# Patient Record
Sex: Male | Born: 1945 | Race: White | Hispanic: No | Marital: Married | Smoking: Former smoker
Health system: Southern US, Community
[De-identification: ages and names within clinical notes are randomized; demographics above are authoritative.]

## PROBLEM LIST (undated history)

## (undated) DIAGNOSIS — E785 Hyperlipidemia, unspecified: Secondary | ICD-10-CM

## (undated) DIAGNOSIS — M1990 Unspecified osteoarthritis, unspecified site: Secondary | ICD-10-CM

## (undated) DIAGNOSIS — I10 Essential (primary) hypertension: Secondary | ICD-10-CM

## (undated) HISTORY — DX: Hyperlipidemia, unspecified: E78.5

## (undated) HISTORY — PX: TOOTH EXTRACTION: SUR596

## (undated) HISTORY — DX: Unspecified osteoarthritis, unspecified site: M19.90

## (undated) HISTORY — PX: TONSILLECTOMY: SHX5217

## (undated) HISTORY — PX: TOTAL KNEE ARTHROPLASTY: SHX125

## (undated) HISTORY — PX: VARICOSE VEIN SURGERY: SHX832

## (undated) HISTORY — DX: Essential (primary) hypertension: I10

---

## 2000-02-03 ENCOUNTER — Ambulatory Visit (HOSPITAL_COMMUNITY): Admission: RE | Admit: 2000-02-03 | Discharge: 2000-02-03 | Payer: Self-pay | Admitting: Gastroenterology

## 2002-12-17 ENCOUNTER — Ambulatory Visit (HOSPITAL_COMMUNITY): Admission: RE | Admit: 2002-12-17 | Discharge: 2002-12-17 | Payer: Self-pay | Admitting: Gastroenterology

## 2007-11-08 LAB — HM COLONOSCOPY: HM Colonoscopy: NORMAL

## 2008-01-03 ENCOUNTER — Encounter: Admission: RE | Admit: 2008-01-03 | Discharge: 2008-02-04 | Payer: Self-pay | Admitting: Orthopedic Surgery

## 2008-03-24 ENCOUNTER — Encounter: Admission: RE | Admit: 2008-03-24 | Discharge: 2008-04-24 | Payer: Self-pay | Admitting: Orthopedic Surgery

## 2008-05-07 ENCOUNTER — Ambulatory Visit (HOSPITAL_COMMUNITY): Admission: RE | Admit: 2008-05-07 | Discharge: 2008-05-07 | Payer: Self-pay | Admitting: Gastroenterology

## 2010-08-23 ENCOUNTER — Ambulatory Visit: Payer: Self-pay | Admitting: Family Medicine

## 2010-08-23 DIAGNOSIS — N529 Male erectile dysfunction, unspecified: Secondary | ICD-10-CM | POA: Insufficient documentation

## 2010-08-23 DIAGNOSIS — I10 Essential (primary) hypertension: Secondary | ICD-10-CM | POA: Insufficient documentation

## 2010-08-23 DIAGNOSIS — E785 Hyperlipidemia, unspecified: Secondary | ICD-10-CM | POA: Insufficient documentation

## 2010-08-23 LAB — CONVERTED CEMR LAB
Cholesterol, target level: 200 mg/dL
HDL goal, serum: 40 mg/dL

## 2010-10-19 ENCOUNTER — Encounter: Payer: Self-pay | Admitting: Family Medicine

## 2010-10-19 ENCOUNTER — Ambulatory Visit: Payer: Self-pay | Admitting: Family Medicine

## 2010-10-21 LAB — CONVERTED CEMR LAB
ALT: 31 units/L (ref 0–53)
AST: 24 units/L (ref 0–37)
Albumin: 4 g/dL (ref 3.5–5.2)
Alkaline Phosphatase: 44 units/L (ref 39–117)
BUN: 20 mg/dL (ref 6–23)
Bilirubin, Direct: 0.2 mg/dL (ref 0.0–0.3)
Calcium: 9 mg/dL (ref 8.4–10.5)
Cholesterol: 194 mg/dL (ref 0–200)
Creatinine, Ser: 1 mg/dL (ref 0.4–1.5)
GFR calc non Af Amer: 84.44 mL/min (ref >60.00)
LDL Cholesterol: 111 mg/dL — ABNORMAL HIGH (ref 0–99)
PSA: 0.89 ng/mL (ref 0.10–4.00)
Total Protein: 6.4 g/dL (ref 6.0–8.3)
Triglycerides: 166 mg/dL — ABNORMAL HIGH (ref 0.0–149.0)

## 2010-12-07 NOTE — Assessment & Plan Note (Signed)
Summary: new to est--med check//ccm   Vital Signs:  Patient profile:   65 year old male Height:      75.25 inches Weight:      254 pounds BMI:     31.65 Temp:     98.4 degrees F oral Pulse rate:   80 / minute Pulse rhythm:   regular Resp:     12 per minute BP sitting:   140 / 82  (left arm) Cuff size:   large  Vitals Entered By: Sid Falcon LPN (August 23, 2010 10:33 AM)  Nutrition Counseling: Patient's BMI is greater than 25 and therefore counseled on weight management options.  Serial Vital Signs/Assessments:  Time      Position  BP       Pulse  Resp  Temp     By                     130/88                         Evelena Peat MD  CC: Hypertension Management, Lipid Management   History of Present Illness: new patient to establish care  Past history significant for osteoarthritis with prior bilateral knee replacements, hypertension, and hyperlipidemia. Initiated on hydrochlorothiazide within the past year. Blood pressures generally range 130 to 140s systolic.  needs lipid panel repeat.  Hx erectile dysfunction and needs refills of Viagra.  Colonoscopy 2009. No history of Pneumovax. Needs flu vaccine.  Family history significant for mother with colon cancer history. Father with hypertension.  Patient is married. Nonsmoker. Occasional alcohol use. 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  He notes no problems with any antihypertensive medication side effects.        Positive major cardiovascular risk factors include male age 66 years old or older, hyperlipidemia, and hypertension.  Negative major cardiovascular risk factors include no history of diabetes, negative family history for ischemic heart disease, and  non-tobacco-user status.        Further assessment for target organ damage reveals no history of ASHD, stroke/TIA, or peripheral vascular disease.    Lipid Management History:      Positive NCEP/ATP III risk factors include male age 81 years old or older and hypertension.  Negative NCEP/ATP III risk factors include non-diabetic, no family history for ischemic heart disease, non-tobacco-user status, no ASHD (atherosclerotic heart disease), no prior stroke/TIA, no peripheral vascular disease, and no history of aortic aneurysm.      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    Physical Exam  General:  Well-developed,well-nourished,in no acute distress; alert,appropriate and cooperative throughout examination Mouth:  Oral mucosa and oropharynx without lesions or exudates.  Teeth in good repair. Neck:  No deformities, masses, or tenderness noted. Lungs:  Normal respiratory effort, chest expands symmetrically. Lungs are clear to auscultation, no crackles or wheezes. Heart:  normal rate, regular rhythm, and no gallop.   Extremities:  No clubbing, cyanosis, edema, or deformity noted with normal full range of motion of all joints.   Neurologic:  alert & oriented X3 and cranial nerves II-XII intact.   Cervical Nodes:  No lymphadenopathy noted Psych:  normally interactive, good eye contact, not anxious appearing, and not depressed appearing.     Impression & Recommendations:  Problem # 1:  HYPERTENSION (ICD-401.9)  work on weight loss. His updated medication list for this problem includes:    Hydrochlorothiazide 12.5 Mg Caps (Hydrochlorothiazide) ..... Once daily  Orders: Prescription Created Electronically [phone])  Problem # 2:  HYPERLIPIDEMIA (ICD-272.4)  follow up for wellness exam and labs then. His updated medication list for this problem includes:    Vytorin 10-20 Mg Tabs (Ezetimibe-simvastatin) ..... Once daily  Orders: Prescription Created Electronically [phone])  Problem # 3:  IMPOTENCE OF ORGANIC ORIGIN (ICD-607.84)  refill Viagra. His updated medication list for this problem includes:    Viagra 100 Mg Tabs (Sildenafil citrate) ..... One by mouth once daily as needed  Orders: Prescription Created Electronically [phone])  Problem # 4:  Preventive Health Care (ICD-V70.0) flu and  pneumovax given.  no prior hx of pneumovax per pt.  Complete Medication List: 1)  Hydrochlorothiazide 12.5 Mg Caps (Hydrochlorothiazide) .... Once daily 2)  Vytorin 10-20 Mg Tabs (Ezetimibe-simvastatin) .... Once daily 3)  Viagra 100 Mg Tabs (Sildenafil citrate) .... One by mouth once daily as needed  Other Orders: Pneumococcal Vaccine (29562) Admin 1st Vaccine (13086) Flu Vaccine 40yrs + MEDICARE PATIENTS (V7846) Administration Flu vaccine - MCR (N6295)  Hypertension Assessment/Plan:      The patient's hypertensive risk group is category B: At least one risk factor (excluding diabetes) with no target organ damage.  Today's blood pressure is 140/82.    Lipid Assessment/Plan:      Based on NCEP/ATP III, the patient's risk factor category is "2 or more risk factors and a calculated 10 year CAD risk of > 20%".  The patient's lipid goals are as follows: Total cholesterol goal is 200; LDL cholesterol goal is 130; HDL cholesterol goal is 40; Triglyceride goal is 150.    Patient Instructions: 1)  Schedule Medicare wellness visit 2)  It is important that you exercise reguarly at least 20 minutes 5 times a week. If you develop chest pain, have severe difficulty breathing, or feel very tired, stop exercising immediately and seek medical attention.  3)  You need to lose weight. Consider a lower calorie diet and regular exercise.  Prescriptions: VYTORIN 10-20 MG  TABS (EZETIMIBE-SIMVASTATIN) once daily  #90 x 3   Entered and Authorized by:   Evelena Peat MD   Signed by:   Evelena Peat MD on 08/23/2010   Method used:   Electronically to        [facility] [phone]* (retail)       [address]       [place], [place]  [address]       Ph: [phone] or [phone]       Fax: [phone]   RxID:   [id number] HYDROCHLOROTHIAZIDE 12.5 MG CAPS (HYDROCHLOROTHIAZIDE) once daily  #90 x 3   Entered and Authorized by:   Evelena Peat MD   Signed by:   Evelena Peat MD on 08/23/2010   Method  used:   Electronically to        [facility] [phone]* (retail)       [address]       [place], [place]  [address]       Ph: [phone] or [phone]       Fax: [phone]   RxID:   [id number] VIAGRA 100 MG TABS (SILDENAFIL CITRATE) one by mouth once daily as needed  #6 x 11   Entered and Authorized by:   Evelena Peat MD   Signed by:   Evelena Peat MD on 08/23/2010   Method used:   Electronically to        [facility] [phone]* (retail)       [address]       [place], [place]  [address]       Ph: [phone] or [phone]       Fax: [phone]   RxID:   [id number]    Orders Added: 1)  Pneumococcal Vaccine [90732] 2)  Admin 1st Vaccine [90471] 3)  Flu Vaccine 1yrs + MEDICARE PATIENTS [Q2039] 4)  Administration Flu vaccine - MCR [G0008] 5)  New Patient Level III [90240] 6)  Prescription Created Electronically [phone]   Immunizations Administered:  Pneumonia Vaccine:    Vaccine Type: Pneumovax    Site: right deltoid    Mfr: Merck    Dose: 0.5 ml    Route: IM    Given by: Sid Falcon LPN    Exp. Date: 02/29/2012    Lot #: [id number]    VIS given: 10/12/09 version given August 23, 2010.   Immunizations Administered:  Pneumonia Vaccine:    Vaccine Type: Pneumovax    Site: right deltoid    Mfr: Merck    Dose: 0.5 ml    Route: IM    Given by: Sid Falcon LPN    Exp. Date: 02/29/2012    Lot #: [id number]    VIS given: 10/12/09 version given August 23, 2010.    Flu Vaccine Consent Questions     Do you have a history of severe allergic reactions to this vaccine? no    Any prior history of allergic reactions to egg and/or gelatin? no    Do you have a sensitivity to the preservative Thimersol? no    Do you have a past history of Guillan-Barre Syndrome? no    Do you currently have an acute febrile illness? no    Have you ever had a severe reaction to latex? no    Vaccine information given and explained to patient? yes    Are you currently pregnant?  no    Lot Number:[id number]   Exp Date:05/07/2011   Site Given  Left Deltoid IMedflu  Preventive Care Screening  Colonoscopy:    Date:  11/08/2007    Results:  normal

## 2010-12-09 NOTE — Assessment & Plan Note (Signed)
Summary: pt will come in fasting/njr   Vital Signs:  Patient profile:   65 year old male Weight:      259 pounds Temp:     98.3 degrees F oral BP sitting:   130 / 88  (left arm) Cuff size:   large  Vitals Entered By: Sid Falcon LPN (October 19, 2010 9:10 AM)  History of Present Illness: Patient here for initial Medicare wellness exam. Past history hypertension, hyperlipidemia, and erectile dysfunction. Medications reviewed.  Last colonoscopy 2009. No history of shingles vaccine. Flu vaccine and Pneumovax given last visit. does not exercise consistently.  PMH, SH, and FH reviewed.  Clinical Review Panels:  Prevention   Last Colonoscopy:  normal (11/08/2007)  Immunizations   Last Flu Vaccine:  Fluvax 3+ (08/23/2010)   Last Pneumovax:  Pneumovax (08/23/2010)  Diabetes Management   Last Flu Vaccine:  Fluvax 3+ (08/23/2010)   Last Pneumovax:  Pneumovax (08/23/2010)   Allergies (verified): No Known Drug Allergies  Past History:  Past Medical History: Last updated: 08/23/2010 Arthritis Hyperlipidemia Hypertension  Past Surgical History: Last updated: 08/23/2010 Bil knee replaced March and June 2009 Varicose veins removed 1991 Tonsils removed as a child 1991 Orthoscopic surgeries on Rt knee 1991, 1995  Family History: Last updated: 08/23/2010 Family History of Arthritis, father Family History of Colon CA, mother Family History Hypertension, father  Social History: Last updated: 08/23/2010 Occupation:  Semi-retired Tax adviser, 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 Alcohol use-yes Regular exercise-yes  Risk Factors: Exercise: yes (08/23/2010)  Risk Factors: Smoking Status: quit (08/23/2010) PMH-FH-SH reviewed for relevance  Review of Systems  The patient denies anorexia, fever, weight loss, vision loss, decreased hearing, hoarseness, chest pain, syncope, dyspnea on exertion, peripheral edema, prolonged cough, headaches, hemoptysis,  abdominal pain, melena, hematochezia, severe indigestion/heartburn, hematuria, incontinence, genital sores, muscle weakness, suspicious skin lesions, transient blindness, difficulty walking, depression, unusual weight change, abnormal bleeding, enlarged lymph nodes, and testicular masses.    Physical Exam  General:  Well-developed,well-nourished,in no acute distress; alert,appropriate and cooperative throughout examination Head:  Normocephalic and atraumatic without obvious abnormalities. No apparent alopecia or balding. Eyes:  pupils equal, pupils round, and pupils reactive to light.  pupils equal, pupils round, and pupils reactive to light.   Ears:  External ear exam shows no significant lesions or deformities.  Otoscopic examination reveals clear canals, tympanic membranes are intact bilaterally without bulging, retraction, inflammation or discharge. Hearing is grossly normal bilaterally. Mouth:  Oral mucosa and oropharynx without lesions or exudates.  Teeth in good repair. Neck:  No deformities, masses, or tenderness noted. Chest Wall:  No deformities, masses, tenderness or gynecomastia noted. Lungs:  Normal respiratory effort, chest expands symmetrically. Lungs are clear to auscultation, no crackles or wheezes. Heart:  Normal rate and regular rhythm. S1 and S2 normal without gallop, murmur, click, rub or other extra sounds. Abdomen:  Bowel sounds positive,abdomen soft and non-tender without masses, organomegaly or hernias noted. Rectal:  No external abnormalities noted. Normal sphincter tone. No rectal masses or tenderness. Prostate:  Prostate gland firm and smooth, no enlargement, nodularity, tenderness, mass, asymmetry or induration. Msk:  No deformity or scoliosis noted of thoracic or lumbar spine.   Extremities:  scars from previous bilateral knee replacements Neurologic:  alert & oriented X3 and cranial nerves II-XII intact.  alert & oriented X3 and cranial nerves II-XII intact.   Skin:   no rashes and no suspicious lesions.  no rashes and no suspicious lesions.   Cervical Nodes:  No lymphadenopathy noted Psych:  normally interactive, good eye contact, not anxious appearing, and not depressed appearing.  normally interactive, good eye contact, not anxious appearing, and not depressed appearing.     Impression & Recommendations:  Problem # 1:  Preventive Health Care (ICD-V70.0) His insurance does not cover Shingles vaccine and he wishes to wait. Needs more consistent exercise.  Check appropriate labs.  Baseline EKG obtained.  Problem # 2:  HYPERLIPIDEMIA (ICD-272.4)  His updated medication list for this problem includes:    Vytorin 10-20 Mg Tabs (Ezetimibe-simvastatin) ..... Once daily  Orders: Venipuncture (04540) Specimen Handling (98119) TLB-Lipid Panel (80061-LIPID) TLB-Hepatic/Liver Function Pnl (80076-HEPATIC)  Problem # 3:  HYPERTENSION (ICD-401.9)  His updated medication list for this problem includes:    Hydrochlorothiazide 12.5 Mg Caps (Hydrochlorothiazide) ..... Once daily  Orders: Venipuncture (14782) Specimen Handling (95621) TLB-BMP (Basic Metabolic Panel-BMET) (80048-METABOL)  Problem # 4:  IMPOTENCE OF ORGANIC ORIGIN (ICD-607.84)  His updated medication list for this problem includes:    Viagra 100 Mg Tabs (Sildenafil citrate) ..... One by mouth once daily as needed  Complete Medication List: 1)  Hydrochlorothiazide 12.5 Mg Caps (Hydrochlorothiazide) .... Once daily 2)  Vytorin 10-20 Mg Tabs (Ezetimibe-simvastatin) .... Once daily 3)  Viagra 100 Mg Tabs (Sildenafil citrate) .... One by mouth once daily as needed  Other Orders: EKG w/ Interpretation (93000) TLB-PSA (Prostate Specific Antigen) (84153-PSA) Welcome to Medicare, Physical (H0865)   Orders Added: 1)  EKG w/ Interpretation [93000] 2)  Venipuncture [36415] 3)  Specimen Handling [99000] 4)  TLB-Lipid Panel [80061-LIPID] 5)  TLB-Hepatic/Liver Function Pnl [80076-HEPATIC] 6)   TLB-BMP (Basic Metabolic Panel-BMET) [80048-METABOL] 7)  TLB-PSA (Prostate Specific Antigen) [78469-GEX] 8)  Welcome to Medicare, Physical [G0402]

## 2011-03-22 NOTE — Op Note (Signed)
Paul Townsend, Paul Townsend                ACCOUNT NO.:  [id number]   MEDICAL RECORD NO.:  [id number]          PATIENT TYPE:  AMB   LOCATION:  ENDO                         FACILITY:  [facility]   PHYSICIAN:  Anselmo Rod, M.D.  DATE OF BIRTH:  18-Jan-1946   DATE OF PROCEDURE:  05/07/2008  DATE OF DISCHARGE:  05/07/2008                               OPERATIVE REPORT   PROCEDURE PERFORMED:  Screening colonoscopy.   ENDOSCOPIST:  Anselmo Rod, M.D.   INSTRUMENT USED:  Pentax videocolonoscope.   INDICATIONS FOR PROCEDURE:  A 65 year old white male with a family  history of colon cancer in his mother, who was diagnosed at the age of  38.  He is undergoing a screening colonoscopy to rule out colonic  polyps, masses, etc.   PREPROCEDURE PREPARATION:  Informed consent was procured from the  patient.  The patient was fasted for 8 hours prior to the procedure and  prepped with a bottle of magnesium citrate and a gallon NuLYTELY the  night prior to the procedure.  The risks and benefits of the procedure,  including a 10% miss rate of cancer and polyps were discussed with the  patient as well.   PREPROCEDURE PHYSICAL:  The patient had stable vital signs.  NECK:  Supple.  CHEST:  Clear to auscultation.  S1 and S2 regular.  ABDOMEN:  Soft with normal bowel sounds.   DESCRIPTION OF PROCEDURE:  The patient was placed in the left lateral  decubitus position, sedated with 125 mcg of Fentanyl and 10 mg of Versed  given intravenously in slow incremental doses. Once the patient was  adequately sedated and maintained on low-flow oxygen and continuous  cardiac monitoring, the Pentax videocolonoscope was advanced from the  rectum to the cecum.  There was evidence of sigmoid diverticulosis, but  the appendiceal orifice and cecal valve were clearly visualized and  photographed.  No masses or polyps were seen.  Small internal  hemorrhoids were appreciated on retroflexion in the rectum.  The  terminal ileum  appeared healthy and without lesions.  The patient  tolerated the procedure well without immediate complications.   IMPRESSION:  1. Small nonbleeding internal hemorrhoids.  2. Sigmoid diverticulosis.  3. Normal-appearing transverse colon, right colon, cecum and terminal      ileum.  4. No masses or polyps seen.   RECOMMENDATIONS:  1. Continue a high fiber diet with liberal fluid intake.  2. Repeat colonoscopy in the next 5 years.  If the patient has any      abnormal GI symptoms in the interim, he is to contact the office      immediately for further recommendations.  3. Outpatient follow-up as need arises in the future.      Anselmo Rod, M.D.  Electronically Signed     JNM/MEDQ  D:  05/16/2008  T:  05/16/2008  Job:  [id number]   cc:   Evelena Peat, M.D.

## 2011-03-25 NOTE — Procedures (Signed)
[facility]  Patient:    Paul Townsend, Paul Townsend                         MRN: [id number] Proc. Date: 02/03/00 Adm. Date:  [id number] Attending:  Rich Brave CC:         Nolon Nations, M.D., M.P.H.                           Procedure Report  PROCEDURE PERFORMED:  Colonoscopy.  INDICATION FOR PROCEDURE:  A 65 year old for colon cancer screening.  FINDINGS:  Mild to moderate sigmoid diverticulosis.  Mild melanosis coli.  CONSENT:  The nature, purpose and risks of the procedure have been discussed with the patient who provided written consent.  SEDATION:  Fentanyl 100 mcg and Versed 10 mg IV without arrhythmias or desaturation.  DESCRIPTION OF PROCEDURE:  Digital exam of the prostate was normal.  The Olympus adult video colonoscope was advanced easily around the colon, reaching the cecum which was identified by clear visualization of the ileocecal valve in the absence of further lumen.  The patient was placed in the supine position to facility passage in the proximal colon.  Pull back was then performed.  The quality of the prep was excellent so it was felt that all areas were well seen.  There was mild melanosis coli, most prominent in the proximal colon. There was ild to moderate sigmoid diverticular change with associated muscular thickening (mycosis).  No polyps, cancer plaques or vascular malformations were seen and retroflexion f the rectum was unremarkable.  The patient tolerated the procedure well and there were no apparent complications.  No biopsies were obtained.  IMPRESSION: 1. Family history of colon cancer, but without any evidence of polyps on    the current examination. 2. Mild sigmoid diverticulosis. 3. Mild melanosis coli.  PLAN:  Consider flexible sigmoidoscopy in five years for continued screening, with another colonoscopy in about ten years. DD:  02/03/00 TD:  02/04/00 Job: [id number]

## 2011-08-03 ENCOUNTER — Other Ambulatory Visit: Payer: Self-pay | Admitting: Family Medicine

## 2011-08-22 ENCOUNTER — Other Ambulatory Visit: Payer: Self-pay | Admitting: Family Medicine

## 2011-10-23 ENCOUNTER — Other Ambulatory Visit: Payer: Self-pay | Admitting: Family Medicine

## 2011-11-04 ENCOUNTER — Ambulatory Visit (INDEPENDENT_AMBULATORY_CARE_PROVIDER_SITE_OTHER): Payer: Medicare Other | Admitting: Family Medicine

## 2011-11-04 ENCOUNTER — Encounter: Payer: Self-pay | Admitting: Family Medicine

## 2011-11-04 DIAGNOSIS — K5900 Constipation, unspecified: Secondary | ICD-10-CM | POA: Insufficient documentation

## 2011-11-04 NOTE — Progress Notes (Signed)
  Subjective:    Patient ID: Paul Townsend, male    DOB: 06/14/1946, 65 y.o.   MRN: [id number]  HPI Paul Townsend is a 65 year old, married man nonsmoker, who comes in today because he has not had a bowel movement since Monday.  He normally has two to 3 loose bowel movements daily.  Note cause for constipation.  No fever no chills.  No nausea, vomiting, or diarrhea.  Colonoscopy 2009 normal.   Review of Systems    General and GI review of systems otherwise negative Objective:   Physical Exam  Well-developed well-nourished, male in no acute distress.  Examination the abdomen shows it to be somewhat obese, but not distended.  Bowel sounds are normal.  Diffusely tender.  No rebound no masses.  Rectal normal.  No impaction guaiac-negative      Assessment & Plan:  Constipation plan increase fluid intake, fleets enema at return p.r.n.

## 2011-11-04 NOTE — Patient Instructions (Signed)
Drink lots of water.  Hold the Vytorin.  Use a fleets enema tonight to relieve y  constipation.  Return p.r.n.

## 2011-11-05 ENCOUNTER — Encounter (HOSPITAL_COMMUNITY): Payer: Self-pay | Admitting: Emergency Medicine

## 2011-11-05 ENCOUNTER — Emergency Department (HOSPITAL_COMMUNITY)
Admission: EM | Admit: 2011-11-05 | Discharge: 2011-11-05 | Disposition: A | Discharge status: Home / Self Care | Payer: Medicare Other | Attending: Emergency Medicine | Admitting: Emergency Medicine

## 2011-11-05 DIAGNOSIS — I10 Essential (primary) hypertension: Secondary | ICD-10-CM | POA: Insufficient documentation

## 2011-11-05 DIAGNOSIS — K5900 Constipation, unspecified: Secondary | ICD-10-CM | POA: Insufficient documentation

## 2011-11-05 DIAGNOSIS — E785 Hyperlipidemia, unspecified: Secondary | ICD-10-CM | POA: Insufficient documentation

## 2011-11-05 MED ORDER — POLYETHYLENE GLYCOL 3350 17 G PO PACK: 17.0000 g | PACK | Freq: Every day | ORAL | Status: AC

## 2011-11-05 MED ORDER — PEG 3350-KCL-NABCB-NACL-NASULF 236 G PO SOLR: 4.0000 L | Freq: Once | ORAL | Status: AC

## 2011-11-05 NOTE — ED Notes (Signed)
Pt reports constipation since Monday. Pt seen by Dr. Tawanna Cooler 11/04/2011 and told take enemas at home. Pt took 4 enemas without relief.

## 2011-11-05 NOTE — ED Provider Notes (Signed)
History    65yM with constipation. Has not had bowel movement since Monday. No significant pain. Feels bloat. No n/v. No hx of abdominal surgery. No hx of obstruction. Has been using saline enemas at home but only passing small amounts of stool hard round stool. No urinary complaints. No back pain. Says last colonoscopy in '09 and "they didn't even find a polyp."  CSN: [id number]  Arrival date & time 11/05/11  1139   First MD Initiated Contact with Patient 11/05/11 1236      Chief Complaint  Patient presents with  . Constipation    Since Monday    (Consider location/radiation/quality/duration/timing/severity/associated sxs/prior treatment) HPI  Past Medical History  Diagnosis Date  . Arthritis   . Hyperlipidemia   . Hypertension     Past Surgical History  Procedure Date  . Total knee arthroplasty   . Varicose vein surgery   . Tonsillectomy     Family History  Problem Relation Age of Onset  . Cancer Mother     colon  . Arthritis Father   . Hypertension Father     History  Substance Use Topics  . Smoking status: Former Games developer  . Smokeless tobacco: Not on file  . Alcohol Use: Yes      Review of Systems   Review of symptoms negative unless otherwise noted in HPI.   Allergies  Review of patient's allergies indicates no known allergies.  Home Medications   Current Outpatient Rx  Name Route Sig Dispense Refill  . BETA CAROTENE PO Oral Take 1 tablet by mouth daily.      Marland Kitchen VITAMIN D 1000 UNITS PO TABS Oral Take 1,000 Units by mouth daily.      Marland Kitchen EZETIMIBE-SIMVASTATIN 10-20 MG PO TABS Oral Take 1 tablet by mouth at bedtime.      . OMEGA-3 FATTY ACIDS 1000 MG PO CAPS Oral Take 1 g by mouth daily.      Marland Kitchen GARLIC PO Oral Take 1 capsule by mouth daily.      Marland Kitchen HYDROCHLOROTHIAZIDE 12.5 MG PO CAPS Oral Take 12.5 mg by mouth daily.      Marland Kitchen DHEA PO Oral Take 1 tablet by mouth daily.      Marland Kitchen SILDENAFIL CITRATE 100 MG PO TABS Oral Take 100 mg by mouth daily as needed.  For erectile dysfunction     . VITAMIN C 500 MG PO TABS Oral Take 500 mg by mouth daily.      Marland Kitchen PEG 3350-KCL-NABCB-NACL-NASULF 236 G PO SOLR Oral Take 4,000 mLs by mouth once. 4000 mL 0  . POLYETHYLENE GLYCOL 3350 PO PACK Oral Take 17 g by mouth daily. 14 each 0    BP 163/92  Pulse 86  Temp(Src) 98.8 F (37.1 C) (Oral)  Resp 18  SpO2 95%  Physical Exam  Nursing note and vitals reviewed. Constitutional: He appears well-developed and well-nourished. No distress.  HENT:  Head: Normocephalic and atraumatic.  Eyes: Conjunctivae are normal. Right eye exhibits no discharge. Left eye exhibits no discharge.  Neck: Neck supple.  Cardiovascular: Normal rate, regular rhythm and normal heart sounds.  Exam reveals no gallop and no friction rub.   No murmur heard. Pulmonary/Chest: Effort normal and breath sounds normal. No respiratory distress.  Abdominal: Soft. He exhibits no distension and no mass. There is no tenderness. There is no guarding.  Musculoskeletal: He exhibits no edema and no tenderness.  Neurological: He is alert.  Skin: Skin is warm and dry.  Psychiatric: He has a  normal mood and affect. His behavior is normal. Thought content normal.    ED Course  Procedures (including critical care time)  Labs Reviewed - No data to display No results found.   1. Constipation       MDM  65yM with constipation. Pt requesting additional medication aside from enemas. Suggested miralax. When explained that same medication as bowel prep prior to colonoscopy asked why couldn't just take that because just wants to go. Explained that typically use those quantities as bowel prep for people that already having normal BMs. Prescription for miralax given and recommended trying for a couple days first. If can't go feel that larger quantities reasonable. Clinically pt not with mechanical obstruction. Strict return precautions given.        Raeford Razor, MD 11/05/11 1310

## 2011-11-27 ENCOUNTER — Other Ambulatory Visit: Payer: Self-pay | Admitting: Family Medicine

## 2011-12-01 DIAGNOSIS — H35379 Puckering of macula, unspecified eye: Secondary | ICD-10-CM | POA: Diagnosis not present

## 2011-12-11 ENCOUNTER — Other Ambulatory Visit: Payer: Self-pay | Admitting: Family Medicine

## 2011-12-15 ENCOUNTER — Encounter: Payer: Self-pay | Admitting: Family Medicine

## 2011-12-15 ENCOUNTER — Ambulatory Visit (INDEPENDENT_AMBULATORY_CARE_PROVIDER_SITE_OTHER): Payer: Medicare Other | Admitting: Family Medicine

## 2011-12-15 DIAGNOSIS — N529 Male erectile dysfunction, unspecified: Secondary | ICD-10-CM

## 2011-12-15 DIAGNOSIS — E785 Hyperlipidemia, unspecified: Secondary | ICD-10-CM

## 2011-12-15 DIAGNOSIS — R5381 Other malaise: Secondary | ICD-10-CM

## 2011-12-15 DIAGNOSIS — N429 Disorder of prostate, unspecified: Secondary | ICD-10-CM

## 2011-12-15 DIAGNOSIS — R5383 Other fatigue: Secondary | ICD-10-CM

## 2011-12-15 DIAGNOSIS — N400 Enlarged prostate without lower urinary tract symptoms: Secondary | ICD-10-CM

## 2011-12-15 DIAGNOSIS — I10 Essential (primary) hypertension: Secondary | ICD-10-CM | POA: Diagnosis not present

## 2011-12-15 DIAGNOSIS — Z0000 Encounter for general adult medical examination without abnormal findings: Secondary | ICD-10-CM

## 2011-12-15 DIAGNOSIS — N320 Bladder-neck obstruction: Secondary | ICD-10-CM

## 2011-12-15 LAB — HEPATIC FUNCTION PANEL
ALT: 22 U/L (ref 0–53)
Alkaline Phosphatase: 48 U/L (ref 39–117)
Bilirubin, Direct: 0.2 mg/dL (ref 0.0–0.3)
Total Bilirubin: 1.7 mg/dL — ABNORMAL HIGH (ref 0.3–1.2)
Total Protein: 6.4 g/dL (ref 6.0–8.3)

## 2011-12-15 LAB — CBC WITH DIFFERENTIAL/PLATELET
Basophils Relative: 0.9 % (ref 0.0–3.0)
Hemoglobin: 15.1 g/dL (ref 13.0–17.0)
Lymphocytes Relative: 29 % (ref 12.0–46.0)
Monocytes Relative: 8.7 % (ref 3.0–12.0)
Neutro Abs: 3.7 10*3/uL (ref 1.4–7.7)
Neutrophils Relative %: 59.7 % (ref 43.0–77.0)
RBC: 4.69 Mil/uL (ref 4.22–5.81)
WBC: 6.3 10*3/uL (ref 4.5–10.5)

## 2011-12-15 LAB — BASIC METABOLIC PANEL
BUN: 12 mg/dL (ref 6–23)
CO2: 30 mEq/L (ref 19–32)
Chloride: 104 mEq/L (ref 96–112)
Creatinine, Ser: 0.9 mg/dL (ref 0.4–1.5)
Glucose, Bld: 102 mg/dL — ABNORMAL HIGH (ref 70–99)

## 2011-12-15 LAB — LIPID PANEL
LDL Cholesterol: 101 mg/dL — ABNORMAL HIGH (ref 0–99)
Total CHOL/HDL Ratio: 3
VLDL: 22 mg/dL (ref 0.0–40.0)

## 2011-12-15 MED ORDER — EZETIMIBE-SIMVASTATIN 10-20 MG PO TABS: 1.0000 | ORAL_TABLET | Freq: Every day | ORAL | Status: DC

## 2011-12-15 MED ORDER — HYDROCHLOROTHIAZIDE 12.5 MG PO CAPS: 12.5000 mg | ORAL_CAPSULE | Freq: Every day | ORAL | Status: DC

## 2011-12-15 NOTE — Progress Notes (Signed)
Subjective:    Patient ID: Paul Townsend, male    DOB: Oct 10, 1946, 66 y.o.   MRN: [id number]  HPI  Patient here for Medicare wellness exam and medical followup. He has history of hypertension, hyperlipidemia, and erectile dysfunction. Medications reviewed. Compliant with all. Blood pressure stable. Takes Vytorin for hyperlipidemia. No myalgias. History of osteoarthritis with previous bilateral knee replacements. Exercises with cycling.  Recently has had some problems with increased fatigue. Generally sleeping well. Appetite fair. No significant weight changes.  1.  Risk factors based on Past Medical , Social, and Family history  reviewed and as indicated below 2.  Limitations in physical activities no recent falls. States quite active 3.  Depression/mood no depression or mood disorder 4.  Hearing no recent changes in hearing. 5.  ADLs fully independent 6.  Cognitive function (orientation to time and place, language, writing, speech,memory) no memory deficits. 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  Home Safety No issues identified. 8.  Height, weight, and visual acuity. No recent changes 9.  Counseling weight control through diet and exercise. 10. Recommendation of preventive services. Flu vaccine declined. Check on coverage for shingles vaccine. Pneumovax up-to-date. Colonoscopy up to date. 11. Labs based on risk factors lipid panel, hepatic panel, basic metabolic panel, TSH, and CBC. PSA. 12. Care Plan as above    Review of Systems  Constitutional: Negative for fever, activity change, appetite change and fatigue.  HENT: Negative for ear pain, congestion and trouble swallowing.   Eyes: Negative for pain and visual disturbance.  Respiratory: Negative for cough, shortness of breath and wheezing.   Cardiovascular: Negative for chest pain and palpitations.  Gastrointestinal: Negative for nausea, vomiting, abdominal pain, diarrhea, constipation,  blood in stool, abdominal distention and rectal pain.  Genitourinary: Negative for dysuria, hematuria and testicular pain.  Musculoskeletal: Negative for joint swelling and arthralgias.  Skin: Negative for rash.  Neurological: Negative for dizziness, syncope and headaches.  Hematological: Negative for adenopathy.  Psychiatric/Behavioral: Negative for confusion and dysphoric mood.       Objective:   Physical Exam  Constitutional: He is oriented to person, place, and time. He appears well-developed and well-nourished. No distress.  HENT:  Head: Normocephalic and atraumatic.  Right Ear: External ear normal.  Left Ear: External ear normal.  Mouth/Throat: Oropharynx is clear and moist.  Eyes: Conjunctivae and EOM are normal. Pupils are equal, round, and reactive to light.  Neck: Normal range of motion. Neck supple. No thyromegaly present.  Cardiovascular: Normal rate, regular rhythm and normal heart sounds.   No murmur heard. Pulmonary/Chest: No respiratory distress. He has no wheezes. He has no rales.  Abdominal: Soft. Bowel sounds are normal. He exhibits no distension and no mass. There is no tenderness. There is no rebound and no guarding.  Genitourinary: Rectum normal and prostate normal.  Musculoskeletal: He exhibits no edema.  Lymphadenopathy:    He has no cervical adenopathy.  Neurological: He is alert and oriented to person, place, and time. He displays normal reflexes. No cranial nerve deficit.  Skin: No rash noted.  Psychiatric: He has a normal mood and affect.          Assessment & Plan:  #1 hyperlipidemia. Check lipid and hepatic panel #2 hypertension stable.  Continue regular aerobic exercise. Check basic metabolic panel  #3 fatigue. Add CBC and TSH to labs. Consider testosterone if continued symptoms and other labs normal. #4 health maintenance. Patient should consider tetanus booster but not covered for prevention solely  under Medicare. Check on coverage for  shingles vaccine. Pneumovax up-to-date. Colonoscopy up to date. Check PSA

## 2012-01-12 ENCOUNTER — Other Ambulatory Visit: Payer: Self-pay | Admitting: Family Medicine

## 2012-03-28 ENCOUNTER — Other Ambulatory Visit: Payer: Self-pay | Admitting: Family Medicine

## 2012-04-16 ENCOUNTER — Encounter: Payer: Self-pay | Admitting: Family Medicine

## 2012-04-16 ENCOUNTER — Ambulatory Visit (INDEPENDENT_AMBULATORY_CARE_PROVIDER_SITE_OTHER): Payer: Medicare Other | Admitting: Family Medicine

## 2012-04-16 VITALS — BP 130/80 | Temp 98.3°F | Wt 251.0 lb

## 2012-04-16 DIAGNOSIS — Z7189 Other specified counseling: Secondary | ICD-10-CM | POA: Diagnosis not present

## 2012-04-16 DIAGNOSIS — Z7184 Encounter for health counseling related to travel: Secondary | ICD-10-CM

## 2012-04-16 DIAGNOSIS — Z23 Encounter for immunization: Secondary | ICD-10-CM

## 2012-04-16 MED ORDER — ATOVAQUONE-PROGUANIL HCL 250-100 MG PO TABS: ORAL_TABLET | ORAL | Status: DC

## 2012-04-16 NOTE — Patient Instructions (Signed)
Consider typhoid prevention later this summer before next trip to [place]

## 2012-04-16 NOTE — Progress Notes (Signed)
  Subjective:    Patient ID: Paul Townsend, male    DOB: 07-24-1946, 66 y.o.   MRN: [id number]  HPI  Travel consult. Patient planning to travel to [place] by next week. No history of documented hepatitis A. Needs tetanus booster. Will need malaria prevention. At this point we do not have time to start typhoid vaccine since he will start oral malaria prevention.   Review of Systems  Constitutional: Negative for fever and chills.  Respiratory: Negative for cough.   Neurological: Negative for headaches.       Objective:   Physical Exam  Constitutional: He appears well-developed and well-nourished.  Neck: Neck supple. No thyromegaly present.  Cardiovascular: Normal rate and regular rhythm.   Pulmonary/Chest: Effort normal and breath sounds normal. No respiratory distress. He has no wheezes. He has no rales.  Musculoskeletal: He exhibits no edema.  Lymphadenopathy:    He has no cervical adenopathy.          Assessment & Plan:  Travel medicine consultation. Tetanus booster given. Hepatitis A given. Malaria prevention with Malarone. Ideally he needs typhoid vaccine but does not have time to develop full protection before travel. Cautions discussed

## 2012-06-19 DIAGNOSIS — H35379 Puckering of macula, unspecified eye: Secondary | ICD-10-CM | POA: Diagnosis not present

## 2012-06-19 DIAGNOSIS — H52209 Unspecified astigmatism, unspecified eye: Secondary | ICD-10-CM | POA: Diagnosis not present

## 2012-06-19 DIAGNOSIS — H25019 Cortical age-related cataract, unspecified eye: Secondary | ICD-10-CM | POA: Diagnosis not present

## 2012-06-26 ENCOUNTER — Telehealth: Payer: Self-pay | Admitting: Family Medicine

## 2012-06-26 DIAGNOSIS — H35379 Puckering of macula, unspecified eye: Secondary | ICD-10-CM | POA: Diagnosis not present

## 2012-06-26 DIAGNOSIS — H2510 Age-related nuclear cataract, unspecified eye: Secondary | ICD-10-CM | POA: Diagnosis not present

## 2012-06-26 DIAGNOSIS — H3581 Retinal edema: Secondary | ICD-10-CM | POA: Diagnosis not present

## 2012-06-26 NOTE — Telephone Encounter (Signed)
Patient leaving for [place] on Thursday Aug 22.  He needs a refill for atovaquone.  Pharmacy is [facility] [address].

## 2012-06-27 MED ORDER — ATOVAQUONE-PROGUANIL HCL 250-100 MG PO TABS: ORAL_TABLET | ORAL | Status: DC

## 2012-06-27 NOTE — Telephone Encounter (Signed)
Refill OK

## 2012-07-16 ENCOUNTER — Ambulatory Visit (INDEPENDENT_AMBULATORY_CARE_PROVIDER_SITE_OTHER): Payer: Medicare Other | Admitting: Family Medicine

## 2012-07-16 ENCOUNTER — Encounter: Payer: Self-pay | Admitting: Family Medicine

## 2012-07-16 VITALS — BP 142/82 | Temp 98.2°F | Wt 252.0 lb

## 2012-07-16 DIAGNOSIS — R233 Spontaneous ecchymoses: Secondary | ICD-10-CM | POA: Diagnosis not present

## 2012-07-16 LAB — CBC WITH DIFFERENTIAL/PLATELET
Basophils Relative: 0.4 % (ref 0.0–3.0)
Eosinophils Absolute: 0.2 10*3/uL (ref 0.0–0.7)
Eosinophils Relative: 2.4 % (ref 0.0–5.0)
HCT: 49.4 % (ref 39.0–52.0)
Lymphs Abs: 2.3 10*3/uL (ref 0.7–4.0)
MCHC: 33 g/dL (ref 30.0–36.0)
MCV: 94.8 fl (ref 78.0–100.0)
Monocytes Absolute: 1 10*3/uL (ref 0.1–1.0)
Neutrophils Relative %: 53.8 % (ref 43.0–77.0)
Platelets: 224 10*3/uL (ref 150.0–400.0)
RBC: 5.21 Mil/uL (ref 4.22–5.81)
WBC: 7.5 10*3/uL (ref 4.5–10.5)

## 2012-07-16 LAB — BASIC METABOLIC PANEL
BUN: 15 mg/dL (ref 6–23)
CO2: 29 mEq/L (ref 19–32)
Calcium: 9.2 mg/dL (ref 8.4–10.5)
Creatinine, Ser: 1.2 mg/dL (ref 0.4–1.5)
GFR: 61.76 mL/min (ref >60.00)
Glucose, Bld: 109 mg/dL — ABNORMAL HIGH (ref 70–99)
Sodium: 140 mEq/L (ref 135–145)

## 2012-07-16 LAB — HEPATIC FUNCTION PANEL
Albumin: 4.2 g/dL (ref 3.5–5.2)
Total Protein: 7.2 g/dL (ref 6.0–8.3)

## 2012-07-16 NOTE — Patient Instructions (Addendum)
Elevate legs frequently Follow up promptly for any fever or worsening rash

## 2012-07-16 NOTE — Progress Notes (Signed)
  Subjective:    Patient ID: Paul Townsend, male    DOB: 02-17-1946, 66 y.o.   MRN: [id number]  HPI  Rash lower legs. Appeared a few days ago. Recent flight. He had some mild edema. No other skin rash. He had some pain lower legs initially but less today. Rash is already fading somewhat. He denies any arthralgias. No fever. No chills. No recent tick bites. No headaches. No bruising  Past Medical History  Diagnosis Date  . Arthritis   . Hyperlipidemia   . Hypertension    Past Surgical History  Procedure Date  . Total knee arthroplasty   . Varicose vein surgery   . Tonsillectomy     reports that he has quit smoking. He does not have any smokeless tobacco history on file. He reports that he drinks alcohol. His drug history not on file. family history includes Arthritis in his father; Cancer in his mother; and Hypertension in his father. No Known Allergies    Review of Systems  Constitutional: Negative for fever, chills and unexpected weight change.  Respiratory: Negative for cough and shortness of breath.   Cardiovascular: Negative for chest pain.  Gastrointestinal: Negative for abdominal pain.  Genitourinary: Negative for hematuria.  Musculoskeletal: Negative for arthralgias.  Skin: Positive for rash.  Neurological: Negative for headaches.  Hematological: Negative for adenopathy. Does not bruise/bleed easily.       Objective:   Physical Exam  Constitutional: He appears well-developed and well-nourished.  Neck: Neck supple.  Cardiovascular: Normal rate and regular rhythm.   No murmur heard. Pulmonary/Chest: Effort normal and breath sounds normal. No respiratory distress. He has no wheezes. He has no rales.  Lymphadenopathy:    He has no cervical adenopathy.  Skin:       Patient has some nonblanching petechiae lower legs bilaterally. Trace edema bilaterally. No other skin rash. No ecchymosis          Assessment & Plan:  Petechiae lower legs. Probably secondary to  edema. No evidence for infectious and connective tissue origin less likely. Check sedimentation rate, CBC, basic chemistries. Elevate legs frequently. followup promptly for any new rash or new symptoms

## 2012-07-17 NOTE — Progress Notes (Signed)
Quick Note:  Pt wife informed ______ 

## 2012-09-12 ENCOUNTER — Encounter: Payer: Self-pay | Admitting: Family Medicine

## 2012-09-12 ENCOUNTER — Ambulatory Visit (INDEPENDENT_AMBULATORY_CARE_PROVIDER_SITE_OTHER): Payer: Medicare Other | Admitting: Family Medicine

## 2012-09-12 VITALS — BP 158/98 | Temp 97.8°F | Wt 260.0 lb

## 2012-09-12 DIAGNOSIS — M722 Plantar fascial fibromatosis: Secondary | ICD-10-CM | POA: Diagnosis not present

## 2012-09-12 NOTE — Progress Notes (Signed)
Subjective:     Patient ID: Paul Townsend, male   DOB: 21-Aug-1946, 66 y.o.   MRN: [id number]  HPI 66 year old male with approximately 1 month history of R foot pain, described by pt as achy.  The pain is located on the medial aspect of the R foot, posterior to the arch and anterior to the heel.  He describes it as worsening with activity, and is notably painful in the morning when he first gets out of bed.  Also is painful on standing up after pt has been resting from being on his feet a lot.  Pt has discontinued his normal routine of walking 4 miles several times per week because of the pain, and also at the recommendation of a chiropractor.  Ibuprofen has had little effect, although pt notes that ice has been the most helpful.  He denies any recent trauma or heavy activity around the time of the onset of pain.  Review of Systems  Musculoskeletal:       R medial foot pain.       Objective:   Physical Exam  Constitutional: He appears well-developed and well-nourished.  Musculoskeletal:       R medial foot pain anterior to heel, posterior to arch reproducible with deep palpation or dorsiflexion.  No erythema, swelling, or ulcerations noted.       Assessment:     66 year old male with 1 month history of R foot pain, also incidentally noted to have high BP today at 158/98.    Plan:     1. R foot pain: plantar fasciitis vs fracture. Location of pain somewhat anterior for classic plantar fasciitis, but given symptoms of worse pain first thing in the morning, and increase in pain when standing up after resting, this is likely plantar fasciitis.  Also lack of trauma history to R foot makes fracture less likely.  Emphasized to pt that stretching first thing in the morning via dorsiflexion, in addition to icing his R foot would be helpful.  Also told pt it would be ok to resume his walking regimen as tolerated, as abstaining from use would not significantly increase resolution of symptoms.  2.  Elevated BP: instructed pt to take his BP at home over the next 2 weeks using his wife's BP cuff, and to make f/u appt should he get several readings above 140/90.  Marthann Schiller, MS3    Pt has presentation consistent with plantar fasciitis.  No clinical suspicion of stress fracture.  No bony tenderness.  Discussed icing, stretches, heel cups, and continue walking as tolerated.  Evelena Peat, MD

## 2012-09-12 NOTE — Patient Instructions (Addendum)
Plantar Fasciitis Plantar fasciitis is a common condition that causes foot pain. It is soreness (inflammation) of the band of tough fibrous tissue on the bottom of the foot that runs from the heel bone (calcaneus) to the ball of the foot. The cause of this soreness may be from excessive standing, poor fitting shoes, running on hard surfaces, being overweight, having an abnormal walk, or overuse (this is common in runners) of the painful foot or feet. It is also common in aerobic exercise dancers and ballet dancers. SYMPTOMS  Most people with plantar fasciitis complain of:  Severe pain in the morning on the bottom of their foot especially when taking the first steps out of bed. This pain recedes after a few minutes of walking.  Severe pain is experienced also during walking following a long period of inactivity.  Pain is worse when walking barefoot or up stairs DIAGNOSIS   Your caregiver will diagnose this condition by examining and feeling your foot.  Special tests such as X-rays of your foot, are usually not needed. PREVENTION   Consult a sports medicine professional before beginning a new exercise program.  Walking programs offer a good workout. With walking there is a lower chance of overuse injuries common to runners. There is less impact and less jarring of the joints.  Begin all new exercise programs slowly. If problems or pain develop, decrease the amount of time or distance until you are at a comfortable level.  Wear good shoes and replace them regularly.  Stretch your foot and the heel cords at the back of the ankle (Achilles tendon) both before and after exercise.  Run or exercise on even surfaces that are not hard. For example, asphalt is better than pavement.  Do not run barefoot on hard surfaces.  If using a treadmill, vary the incline.  Do not continue to workout if you have foot or joint problems. Seek professional help if they do not improve. HOME CARE INSTRUCTIONS     Avoid activities that cause you pain until you recover.  Use ice or cold packs on the problem or painful areas after working out.  Only take over-the-counter or prescription medicines for pain, discomfort, or fever as directed by your caregiver.  Soft shoe inserts or athletic shoes with air or gel sole cushions may be helpful.  If problems continue or become more severe, consult a sports medicine caregiver or your own health care provider. Cortisone is a potent anti-inflammatory medication that may be injected into the painful area. You can discuss this treatment with your caregiver. MAKE SURE YOU:   Understand these instructions.  Will watch your condition.  Will get help right away if you are not doing well or get worse. Document Released: 07/19/2001 Document Revised: 01/16/2012 Document Reviewed: 09/17/2008 [facility] Patient Information 2013 [place], [place].    ------------------------------------------------------------------------------------------------------------------------- 1. Check blood pressure at home over next 2 weeks, and contact Dr. Caryl Never to schedule a follow-up appointment if you are getting several readings greater than 140/90.

## 2012-12-24 ENCOUNTER — Other Ambulatory Visit: Payer: Self-pay | Admitting: Family Medicine

## 2012-12-27 DIAGNOSIS — H25019 Cortical age-related cataract, unspecified eye: Secondary | ICD-10-CM | POA: Diagnosis not present

## 2012-12-27 DIAGNOSIS — H2510 Age-related nuclear cataract, unspecified eye: Secondary | ICD-10-CM | POA: Diagnosis not present

## 2012-12-27 DIAGNOSIS — H35379 Puckering of macula, unspecified eye: Secondary | ICD-10-CM | POA: Diagnosis not present

## 2013-01-19 ENCOUNTER — Other Ambulatory Visit: Payer: Self-pay | Admitting: Family Medicine

## 2013-01-28 DIAGNOSIS — H33309 Unspecified retinal break, unspecified eye: Secondary | ICD-10-CM | POA: Diagnosis not present

## 2013-01-28 DIAGNOSIS — H25019 Cortical age-related cataract, unspecified eye: Secondary | ICD-10-CM | POA: Diagnosis not present

## 2013-01-29 DIAGNOSIS — H33309 Unspecified retinal break, unspecified eye: Secondary | ICD-10-CM | POA: Diagnosis not present

## 2013-03-21 DIAGNOSIS — H2589 Other age-related cataract: Secondary | ICD-10-CM | POA: Diagnosis not present

## 2013-03-21 DIAGNOSIS — H25019 Cortical age-related cataract, unspecified eye: Secondary | ICD-10-CM | POA: Diagnosis not present

## 2013-04-11 DIAGNOSIS — H269 Unspecified cataract: Secondary | ICD-10-CM | POA: Diagnosis not present

## 2013-04-11 DIAGNOSIS — H25019 Cortical age-related cataract, unspecified eye: Secondary | ICD-10-CM | POA: Diagnosis not present

## 2013-04-11 DIAGNOSIS — H2589 Other age-related cataract: Secondary | ICD-10-CM | POA: Diagnosis not present

## 2013-04-23 ENCOUNTER — Other Ambulatory Visit: Payer: Self-pay | Admitting: Family Medicine

## 2013-05-13 DIAGNOSIS — H33309 Unspecified retinal break, unspecified eye: Secondary | ICD-10-CM | POA: Diagnosis not present

## 2013-05-28 DIAGNOSIS — K5730 Diverticulosis of large intestine without perforation or abscess without bleeding: Secondary | ICD-10-CM | POA: Diagnosis not present

## 2013-05-28 DIAGNOSIS — Z800 Family history of malignant neoplasm of digestive organs: Secondary | ICD-10-CM | POA: Diagnosis not present

## 2013-05-28 DIAGNOSIS — Z8371 Family history of colonic polyps: Secondary | ICD-10-CM | POA: Diagnosis not present

## 2013-05-28 DIAGNOSIS — K219 Gastro-esophageal reflux disease without esophagitis: Secondary | ICD-10-CM | POA: Diagnosis not present

## 2013-05-28 DIAGNOSIS — Z1211 Encounter for screening for malignant neoplasm of colon: Secondary | ICD-10-CM | POA: Diagnosis not present

## 2013-06-06 DIAGNOSIS — H35379 Puckering of macula, unspecified eye: Secondary | ICD-10-CM | POA: Diagnosis not present

## 2013-06-06 DIAGNOSIS — H33309 Unspecified retinal break, unspecified eye: Secondary | ICD-10-CM | POA: Diagnosis not present

## 2013-07-26 ENCOUNTER — Other Ambulatory Visit: Payer: Self-pay | Admitting: Family Medicine

## 2013-08-07 DIAGNOSIS — K5730 Diverticulosis of large intestine without perforation or abscess without bleeding: Secondary | ICD-10-CM | POA: Diagnosis not present

## 2013-08-07 DIAGNOSIS — Z1211 Encounter for screening for malignant neoplasm of colon: Secondary | ICD-10-CM | POA: Diagnosis not present

## 2013-08-07 DIAGNOSIS — Z800 Family history of malignant neoplasm of digestive organs: Secondary | ICD-10-CM | POA: Diagnosis not present

## 2013-08-07 DIAGNOSIS — Z8371 Family history of colonic polyps: Secondary | ICD-10-CM | POA: Diagnosis not present

## 2013-08-19 ENCOUNTER — Encounter: Payer: Self-pay | Admitting: Family Medicine

## 2013-08-19 ENCOUNTER — Other Ambulatory Visit: Payer: Self-pay

## 2013-08-19 ENCOUNTER — Ambulatory Visit (INDEPENDENT_AMBULATORY_CARE_PROVIDER_SITE_OTHER): Payer: Medicare Other | Admitting: Family Medicine

## 2013-08-19 VITALS — BP 142/80 | HR 78 | Temp 97.7°F | Wt 260.0 lb

## 2013-08-19 DIAGNOSIS — Z23 Encounter for immunization: Secondary | ICD-10-CM

## 2013-08-19 DIAGNOSIS — E669 Obesity, unspecified: Secondary | ICD-10-CM | POA: Insufficient documentation

## 2013-08-19 DIAGNOSIS — E785 Hyperlipidemia, unspecified: Secondary | ICD-10-CM

## 2013-08-19 DIAGNOSIS — N320 Bladder-neck obstruction: Secondary | ICD-10-CM

## 2013-08-19 DIAGNOSIS — Z0000 Encounter for general adult medical examination without abnormal findings: Secondary | ICD-10-CM | POA: Diagnosis not present

## 2013-08-19 LAB — LIPID PANEL
Cholesterol: 202 mg/dL — ABNORMAL HIGH (ref 0–200)
HDL: 57.5 mg/dL (ref >39.00)
Triglycerides: 230 mg/dL — ABNORMAL HIGH (ref 0.0–149.0)
VLDL: 46 mg/dL — ABNORMAL HIGH (ref 0.0–40.0)

## 2013-08-19 MED ORDER — SILDENAFIL CITRATE 100 MG PO TABS: 100.0000 mg | ORAL_TABLET | Freq: Every day | ORAL | Status: DC | PRN

## 2013-08-19 MED ORDER — EZETIMIBE-SIMVASTATIN 10-20 MG PO TABS: ORAL_TABLET | ORAL | Status: DC

## 2013-08-19 MED ORDER — HYDROCHLOROTHIAZIDE 12.5 MG PO CAPS: ORAL_CAPSULE | ORAL | Status: DC

## 2013-08-19 NOTE — Progress Notes (Signed)
Subjective:    Patient ID: Paul Townsend, male    DOB: 06/13/1946, 67 y.o.   MRN: [id number]  HPI Patient seen for Medicare wellness exam and medical followup. He has history of hypertension, hyperlipidemia, erectile dysfunction. Medications reviewed and compliant with all. Generally feels well. 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 Occasional slow stream. No major obstructive symptoms. Hyperlipidemia treated with Vytorin. No myalgias. No chest pains. Still needs flu vaccine.  No history of shingles vaccine. He does not think he had chickenpox but does not know for sure. He is walking about 4 miles per day for exercise.  Past Medical History  Diagnosis Date  . Arthritis   . Hyperlipidemia   . Hypertension    Past Surgical History  Procedure Laterality Date  . Total knee arthroplasty    . Varicose vein surgery    . Tonsillectomy      reports that he has quit smoking. He does not have any smokeless tobacco history on file. He reports that he drinks alcohol. His drug history is not on file. family history includes Arthritis in his father; Cancer in his mother; Heart disease (age of onset: 30) in his father; Hypertension in his father. No Known Allergies  1.  Risk factors based on Past Medical , Social, and Family history reviewed and as above, 2.  Limitations in physical activities no falls.  Walk for exercise as above. 3.  Depression/mood no issues. 4.  Hearing no major deficits. 5.  ADLs independent in all. 6.  Cognitive function (orientation to time and place, language, writing, speech,memory) no memory deficits.  Language and judgement intact. 7.  Home Safety no issues. 8.  Height, weight, and visual acuity. All stable. 9.  Counseling discussed exercise and weight control. 10. Recommendation of preventive services. Recommended flu vaccine.  Discussed shingles vaccine but he does not think he had chicken pox. 11. Labs based on risk factors lipid  and PSA. 12. Care Plan as above.      Review of Systems  Constitutional: Negative for fever, activity change, appetite change and fatigue.  HENT: Negative for congestion, ear pain and trouble swallowing.   Eyes: Negative for pain and visual disturbance.  Respiratory: Negative for cough, shortness of breath and wheezing.   Cardiovascular: Negative for chest pain and palpitations.  Gastrointestinal: Negative for nausea, vomiting, abdominal pain, diarrhea, constipation, blood in stool, abdominal distention and rectal pain.  Endocrine: Negative for polydipsia and polyuria.  Genitourinary: Negative for dysuria, hematuria and testicular pain.  Musculoskeletal: Negative for arthralgias and joint swelling.  Skin: Negative for rash.  Neurological: Negative for dizziness, syncope and headaches.  Hematological: Negative for adenopathy.  Psychiatric/Behavioral: Negative for confusion and dysphoric mood.       Objective:   Physical Exam  Constitutional: He is oriented to person, place, and time. He appears well-developed and well-nourished. No distress.  HENT:  Head: Normocephalic and atraumatic.  Right Ear: External ear normal.  Left Ear: External ear normal.  Mouth/Throat: Oropharynx is clear and moist.  Eyes: Conjunctivae and EOM are normal. Pupils are equal, round, and reactive to light.  Neck: Normal range of motion. Neck supple. No thyromegaly present.  Cardiovascular: Normal rate, regular rhythm and normal heart sounds.   No murmur heard. Pulmonary/Chest: No respiratory distress. He has no wheezes. He has no rales.  Abdominal: Soft. Bowel sounds are normal. He exhibits no distension and no mass. There is no tenderness. There is no rebound and no guarding.  Musculoskeletal:  He exhibits no edema.  Lymphadenopathy:    He has no cervical adenopathy.  Neurological: He is alert and oriented to person, place, and time. He displays normal reflexes. No cranial nerve deficit.  Skin: No rash  noted.  Patient has a few scattered seborrheic keratoses  Psychiatric: He has a normal mood and affect.          Assessment & Plan:  #1 health maintenance. Flu vaccine given. Continue regular exercise. Check Varicella IgG #2 Hyperlipidemia.  Check lipids.  Continue Vytorin. #3 hypertension.  Stable.

## 2013-08-20 ENCOUNTER — Telehealth: Payer: Self-pay

## 2013-08-20 LAB — VARICELLA ZOSTER ANTIBODY, IGG: Varicella IgG: 779.1 Index — ABNORMAL HIGH (ref <135.00)

## 2013-08-20 NOTE — Telephone Encounter (Signed)
Pt informed about lab results, he wants to know bout his chicken pox results

## 2013-10-10 DIAGNOSIS — H35379 Puckering of macula, unspecified eye: Secondary | ICD-10-CM | POA: Diagnosis not present

## 2013-10-10 DIAGNOSIS — Z961 Presence of intraocular lens: Secondary | ICD-10-CM | POA: Diagnosis not present

## 2013-10-10 DIAGNOSIS — H43819 Vitreous degeneration, unspecified eye: Secondary | ICD-10-CM | POA: Diagnosis not present

## 2014-01-05 ENCOUNTER — Emergency Department (HOSPITAL_COMMUNITY)
Admission: EM | Admit: 2014-01-05 | Discharge: 2014-01-05 | Disposition: A | Discharge status: Other Acute Inpatient Hospital | Payer: Medicare Other | Attending: Emergency Medicine | Admitting: Emergency Medicine

## 2014-01-05 ENCOUNTER — Emergency Department (HOSPITAL_COMMUNITY): Payer: Medicare Other

## 2014-01-05 ENCOUNTER — Encounter (HOSPITAL_COMMUNITY): Payer: Self-pay | Admitting: Emergency Medicine

## 2014-01-05 DIAGNOSIS — M129 Arthropathy, unspecified: Secondary | ICD-10-CM | POA: Insufficient documentation

## 2014-01-05 DIAGNOSIS — Z96649 Presence of unspecified artificial hip joint: Secondary | ICD-10-CM | POA: Diagnosis not present

## 2014-01-05 DIAGNOSIS — M978XXA Periprosthetic fracture around other internal prosthetic joint, initial encounter: Secondary | ICD-10-CM | POA: Insufficient documentation

## 2014-01-05 DIAGNOSIS — Z96659 Presence of unspecified artificial knee joint: Secondary | ICD-10-CM | POA: Insufficient documentation

## 2014-01-05 DIAGNOSIS — R6889 Other general symptoms and signs: Secondary | ICD-10-CM | POA: Diagnosis not present

## 2014-01-05 DIAGNOSIS — S72409A Unspecified fracture of lower end of unspecified femur, initial encounter for closed fracture: Secondary | ICD-10-CM | POA: Insufficient documentation

## 2014-01-05 DIAGNOSIS — I10 Essential (primary) hypertension: Secondary | ICD-10-CM | POA: Diagnosis not present

## 2014-01-05 DIAGNOSIS — Z01818 Encounter for other preprocedural examination: Secondary | ICD-10-CM | POA: Diagnosis not present

## 2014-01-05 DIAGNOSIS — D62 Acute posthemorrhagic anemia: Secondary | ICD-10-CM | POA: Diagnosis not present

## 2014-01-05 DIAGNOSIS — Z87891 Personal history of nicotine dependence: Secondary | ICD-10-CM | POA: Insufficient documentation

## 2014-01-05 DIAGNOSIS — Z01810 Encounter for preprocedural cardiovascular examination: Secondary | ICD-10-CM | POA: Diagnosis not present

## 2014-01-05 DIAGNOSIS — Z79899 Other long term (current) drug therapy: Secondary | ICD-10-CM | POA: Insufficient documentation

## 2014-01-05 DIAGNOSIS — M25469 Effusion, unspecified knee: Secondary | ICD-10-CM | POA: Diagnosis not present

## 2014-01-05 DIAGNOSIS — W108XXA Fall (on) (from) other stairs and steps, initial encounter: Secondary | ICD-10-CM | POA: Insufficient documentation

## 2014-01-05 DIAGNOSIS — S7291XA Unspecified fracture of right femur, initial encounter for closed fracture: Secondary | ICD-10-CM

## 2014-01-05 DIAGNOSIS — E785 Hyperlipidemia, unspecified: Secondary | ICD-10-CM | POA: Insufficient documentation

## 2014-01-05 DIAGNOSIS — S72443A Displaced fracture of lower epiphysis (separation) of unspecified femur, initial encounter for closed fracture: Secondary | ICD-10-CM | POA: Diagnosis not present

## 2014-01-05 DIAGNOSIS — S72453A Displaced supracondylar fracture without intracondylar extension of lower end of unspecified femur, initial encounter for closed fracture: Secondary | ICD-10-CM | POA: Diagnosis not present

## 2014-01-05 DIAGNOSIS — Y9289 Other specified places as the place of occurrence of the external cause: Secondary | ICD-10-CM | POA: Insufficient documentation

## 2014-01-05 DIAGNOSIS — T84049A Periprosthetic fracture around unspecified internal prosthetic joint, initial encounter: Secondary | ICD-10-CM | POA: Diagnosis not present

## 2014-01-05 DIAGNOSIS — G8918 Other acute postprocedural pain: Secondary | ICD-10-CM | POA: Diagnosis not present

## 2014-01-05 DIAGNOSIS — S72309A Unspecified fracture of shaft of unspecified femur, initial encounter for closed fracture: Secondary | ICD-10-CM | POA: Diagnosis not present

## 2014-01-05 DIAGNOSIS — M25569 Pain in unspecified knee: Secondary | ICD-10-CM | POA: Diagnosis not present

## 2014-01-05 DIAGNOSIS — Y9389 Activity, other specified: Secondary | ICD-10-CM | POA: Insufficient documentation

## 2014-01-05 HISTORY — PX: OTHER SURGICAL HISTORY: SHX169

## 2014-01-05 LAB — BASIC METABOLIC PANEL
BUN: 16 mg/dL (ref 6–23)
CHLORIDE: 102 meq/L (ref 96–112)
CO2: 28 meq/L (ref 19–32)
CREATININE: 0.92 mg/dL (ref 0.50–1.35)
Calcium: 9.1 mg/dL (ref 8.4–10.5)
GFR calc non Af Amer: 85 mL/min — ABNORMAL LOW (ref >90)
Glucose, Bld: 105 mg/dL — ABNORMAL HIGH (ref 70–99)
Potassium: 4.2 mEq/L (ref 3.7–5.3)
SODIUM: 140 meq/L (ref 137–147)

## 2014-01-05 LAB — CBC WITH DIFFERENTIAL/PLATELET
BASOS ABS: 0 10*3/uL (ref 0.0–0.1)
BASOS PCT: 1 % (ref 0–1)
EOS PCT: 3 % (ref 0–5)
Eosinophils Absolute: 0.3 10*3/uL (ref 0.0–0.7)
HCT: 46.3 % (ref 39.0–52.0)
Hemoglobin: 15.8 g/dL (ref 13.0–17.0)
LYMPHS PCT: 21 % (ref 12–46)
Lymphs Abs: 1.7 10*3/uL (ref 0.7–4.0)
MCH: 32 pg (ref 26.0–34.0)
MCHC: 34.1 g/dL (ref 30.0–36.0)
MCV: 93.9 fL (ref 78.0–100.0)
Monocytes Absolute: 0.8 10*3/uL (ref 0.1–1.0)
Monocytes Relative: 9 % (ref 3–12)
NEUTROS ABS: 5.3 10*3/uL (ref 1.7–7.7)
Neutrophils Relative %: 66 % (ref 43–77)
PLATELETS: 194 10*3/uL (ref 150–400)
RBC: 4.93 MIL/uL (ref 4.22–5.81)
RDW: 13.2 % (ref 11.5–15.5)
WBC: 8.1 10*3/uL (ref 4.0–10.5)

## 2014-01-05 LAB — PROTIME-INR
INR: 0.94 (ref 0.00–1.49)
PROTHROMBIN TIME: 12.4 s (ref 11.6–15.2)

## 2014-01-05 NOTE — ED Notes (Addendum)
Per EMS - pt slipped on ice and fell coming down his front steps.  Pt fell backwards, twisting right leg/knee backwards.  Pt denies LOC.  Pt had bilateral knee replacements in 2009 at [facility].  Pt c/o right knee pain/pressure.  Pt is not able to ambulate or bare weight.  Pt's vitals on scene 180/82 with hx of HTN.  HR 86 and regular, RR 14.

## 2014-01-05 NOTE — ED Notes (Signed)
Pt slipped and fell on brick steps coming out of his house this morning.  Pt fell backwards, bending his right knee behind him.  Pt c/o pain in right knee.  No edema is noted.  Pt is not able to ambulate on extremity.  Pt has pulses in RLE.

## 2014-01-05 NOTE — ED Notes (Signed)
Bed: KG81 Expected date: 01/05/14 Expected time:  Means of arrival:  Comments: EMS-Fall

## 2014-01-05 NOTE — ED Provider Notes (Signed)
CSN: [id number]     Arrival date & time 01/05/14  0920 History   First MD Initiated Contact with Patient 01/05/14 [phone]     Chief Complaint  Patient presents with  . Knee Pain    right - post fall     (Consider location/radiation/quality/duration/timing/severity/associated sxs/prior Treatment) HPI Comments: Patient presents to the ER for evaluation of right knee injury after a fall. Patient reports that he slipped on ice coming down his front steps, falling backwards. He reports that his right leg bent at the knee up under him and he was unable to bear weight on the leg afterwards. He reports moderate pain in the knee initially, but is now comfortable. No head injury or loss of consciousness. No upper extremity injury, no back pain, no chest pain, no abdominal pain.  Patient is a 68 y.o. male presenting with knee pain.  Knee Pain Associated symptoms: no back pain and no neck pain     Past Medical History  Diagnosis Date  . Arthritis   . Hyperlipidemia   . Hypertension    Past Surgical History  Procedure Laterality Date  . Total knee arthroplasty    . Varicose vein surgery    . Tonsillectomy     Family History  Problem Relation Age of Onset  . Cancer Mother     colon  . Arthritis Father   . Hypertension Father   . Heart disease Father 87    MI   History  Substance Use Topics  . Smoking status: Former Research scientist (life sciences)  . Smokeless tobacco: Never Used  . Alcohol Use: Yes    Review of Systems  Musculoskeletal: Positive for arthralgias. Negative for back pain and neck pain.  Neurological: Negative for syncope, numbness and headaches.  All other systems reviewed and are negative.      Allergies  Review of patient's allergies indicates no known allergies.  Home Medications   Current Outpatient Rx  Name  Route  Sig  Dispense  Refill  . BETA CAROTENE PO   Oral   Take 1 tablet by mouth daily.          Marland Kitchen ezetimibe-simvastatin (VYTORIN) 10-20 MG per tablet   Oral   Take  1 tablet by mouth at bedtime.         . fish oil-omega-3 fatty acids 1000 MG capsule   Oral   Take 1 g by mouth daily.           . hydrochlorothiazide (MICROZIDE) 12.5 MG capsule   Oral   Take 12.5 mg by mouth daily.         . sildenafil (VIAGRA) 100 MG tablet   Oral   Take 1 tablet (100 mg total) by mouth daily as needed. For erectile dysfunction   6 tablet   6   . vitamin B-12 (CYANOCOBALAMIN) 1000 MCG tablet   Oral   Take 1,000 mcg by mouth daily.         . vitamin C (ASCORBIC ACID) 500 MG tablet   Oral   Take 500 mg by mouth daily.            BP 159/66  Pulse 85  Temp(Src) 98.4 F (36.9 C) (Oral)  Resp 20  SpO2 98% Physical Exam  Constitutional: He is oriented to person, place, and time. He appears well-developed and well-nourished. No distress.  HENT:  Head: Normocephalic and atraumatic.  Right Ear: Hearing normal.  Left Ear: Hearing normal.  Nose: Nose normal.  Mouth/Throat: Oropharynx  is clear and moist and mucous membranes are normal.  Eyes: Conjunctivae and EOM are normal. Pupils are equal, round, and reactive to light.  Neck: Normal range of motion. Neck supple.  Cardiovascular: Regular rhythm, S1 normal and S2 normal.  Exam reveals no gallop and no friction rub.   No murmur heard. Pulmonary/Chest: Effort normal and breath sounds normal. No respiratory distress. He exhibits no tenderness.  Abdominal: Soft. Normal appearance and bowel sounds are normal. There is no hepatosplenomegaly. There is no tenderness. There is no rebound, no guarding, no tenderness at McBurney's point and negative Murphy's sign. No hernia.  Musculoskeletal: Normal range of motion.       Right knee: He exhibits no effusion, no ecchymosis, no deformity, no laceration and no erythema. Tenderness found.       Legs: Neurological: He is alert and oriented to person, place, and time. He has normal strength. No cranial nerve deficit or sensory deficit. Coordination normal. GCS eye  subscore is 4. GCS verbal subscore is 5. GCS motor subscore is 6.  Skin: Skin is warm, dry and intact. No rash noted. No cyanosis.  Psychiatric: He has a normal mood and affect. His speech is normal and behavior is normal. Thought content normal.    ED Course  Procedures (including critical care time) Labs Review Labs Reviewed  CBC WITH DIFFERENTIAL  BASIC METABOLIC PANEL  PROTIME-INR   Imaging Review Dg Knee Complete 4 Views Right  01/05/2014   CLINICAL DATA:  Fall, right knee pain  EXAM: RIGHT KNEE - COMPLETE 4+ VIEW  COMPARISON:  None.  FINDINGS: Four views of the right knee submitted. There is displaced slight comminuted fracture in distal right femoral shaft. Right knee prosthesis in anatomic alignment. No prosthesis loosening.  IMPRESSION: Slight comminuted displaced fracture in distal right femur. Right knee prosthesis without evidence of loosening.   Electronically Signed   By: Lahoma Crocker M.D.   On: 01/05/2014 10:14     EKG Interpretation None      MDM   Final diagnoses:  None    Patient presents to ER with complaints of knee pain after a fall. He has an isolated knee injury after slipping on ice from a standing position. No history of head injury and he has normal neurologic exam. Neck and back are nontender. Patient is not complaining of any pain at arrival.   X-ray of the knee performed upon arrival. This shows displaced fracture of the distal right femur, just proximal to the knee. This is a femur fracture, but it would not consider it a distracting injury, as the patient is currently experiencing no pain without intervention. I suspect that the fracture occurred because of the extreme torque on the area of the prosthesis when his knee bent under him. This was an isolated leg injury from a fall. There is no concern for head, neck or back injury.  Injury discussed with the patient. He was knee replacements were both performed at [facility] in 2009. He does not remember which  surgeon. He would like to go to [facility] to have this surgical repair.   Case discussed with Dr. Juanell Fairly. Patient has been accepted for transfer. He will be transferred to the emergency department.   Orpah Greek, MD 01/06/14 [phone]

## 2014-01-10 DIAGNOSIS — S72009A Fracture of unspecified part of neck of unspecified femur, initial encounter for closed fracture: Secondary | ICD-10-CM | POA: Diagnosis not present

## 2014-01-10 DIAGNOSIS — I10 Essential (primary) hypertension: Secondary | ICD-10-CM | POA: Diagnosis not present

## 2014-01-10 DIAGNOSIS — E785 Hyperlipidemia, unspecified: Secondary | ICD-10-CM | POA: Diagnosis not present

## 2014-01-15 DIAGNOSIS — I10 Essential (primary) hypertension: Secondary | ICD-10-CM | POA: Diagnosis not present

## 2014-01-15 DIAGNOSIS — E785 Hyperlipidemia, unspecified: Secondary | ICD-10-CM | POA: Diagnosis not present

## 2014-01-15 DIAGNOSIS — S72009A Fracture of unspecified part of neck of unspecified femur, initial encounter for closed fracture: Secondary | ICD-10-CM | POA: Diagnosis not present

## 2014-01-17 DIAGNOSIS — S72009A Fracture of unspecified part of neck of unspecified femur, initial encounter for closed fracture: Secondary | ICD-10-CM | POA: Diagnosis not present

## 2014-01-17 DIAGNOSIS — I10 Essential (primary) hypertension: Secondary | ICD-10-CM | POA: Diagnosis not present

## 2014-01-17 DIAGNOSIS — E785 Hyperlipidemia, unspecified: Secondary | ICD-10-CM | POA: Diagnosis not present

## 2014-01-20 DIAGNOSIS — I10 Essential (primary) hypertension: Secondary | ICD-10-CM | POA: Diagnosis not present

## 2014-01-20 DIAGNOSIS — S72009A Fracture of unspecified part of neck of unspecified femur, initial encounter for closed fracture: Secondary | ICD-10-CM | POA: Diagnosis not present

## 2014-01-20 DIAGNOSIS — E785 Hyperlipidemia, unspecified: Secondary | ICD-10-CM | POA: Diagnosis not present

## 2014-01-22 DIAGNOSIS — M899 Disorder of bone, unspecified: Secondary | ICD-10-CM | POA: Diagnosis not present

## 2014-01-22 DIAGNOSIS — M810 Age-related osteoporosis without current pathological fracture: Secondary | ICD-10-CM | POA: Diagnosis not present

## 2014-01-22 DIAGNOSIS — K219 Gastro-esophageal reflux disease without esophagitis: Secondary | ICD-10-CM | POA: Diagnosis not present

## 2014-01-22 DIAGNOSIS — M949 Disorder of cartilage, unspecified: Secondary | ICD-10-CM | POA: Diagnosis not present

## 2014-01-28 DIAGNOSIS — I10 Essential (primary) hypertension: Secondary | ICD-10-CM | POA: Diagnosis not present

## 2014-01-28 DIAGNOSIS — E785 Hyperlipidemia, unspecified: Secondary | ICD-10-CM | POA: Diagnosis not present

## 2014-01-28 DIAGNOSIS — S72009A Fracture of unspecified part of neck of unspecified femur, initial encounter for closed fracture: Secondary | ICD-10-CM | POA: Diagnosis not present

## 2014-02-03 DIAGNOSIS — E785 Hyperlipidemia, unspecified: Secondary | ICD-10-CM | POA: Diagnosis not present

## 2014-02-03 DIAGNOSIS — S72009A Fracture of unspecified part of neck of unspecified femur, initial encounter for closed fracture: Secondary | ICD-10-CM | POA: Diagnosis not present

## 2014-02-03 DIAGNOSIS — I10 Essential (primary) hypertension: Secondary | ICD-10-CM | POA: Diagnosis not present

## 2014-02-05 DIAGNOSIS — I10 Essential (primary) hypertension: Secondary | ICD-10-CM | POA: Diagnosis not present

## 2014-02-05 DIAGNOSIS — E785 Hyperlipidemia, unspecified: Secondary | ICD-10-CM | POA: Diagnosis not present

## 2014-02-05 DIAGNOSIS — S72009A Fracture of unspecified part of neck of unspecified femur, initial encounter for closed fracture: Secondary | ICD-10-CM | POA: Diagnosis not present

## 2014-02-11 DIAGNOSIS — M899 Disorder of bone, unspecified: Secondary | ICD-10-CM | POA: Diagnosis not present

## 2014-02-11 DIAGNOSIS — M949 Disorder of cartilage, unspecified: Secondary | ICD-10-CM | POA: Diagnosis not present

## 2014-02-11 DIAGNOSIS — Z8781 Personal history of (healed) traumatic fracture: Secondary | ICD-10-CM | POA: Insufficient documentation

## 2014-02-11 DIAGNOSIS — E559 Vitamin D deficiency, unspecified: Secondary | ICD-10-CM | POA: Diagnosis not present

## 2014-02-11 DIAGNOSIS — T84049A Periprosthetic fracture around unspecified internal prosthetic joint, initial encounter: Secondary | ICD-10-CM | POA: Diagnosis not present

## 2014-02-12 DIAGNOSIS — Z8781 Personal history of (healed) traumatic fracture: Secondary | ICD-10-CM | POA: Diagnosis not present

## 2014-02-12 DIAGNOSIS — Z4789 Encounter for other orthopedic aftercare: Secondary | ICD-10-CM | POA: Diagnosis not present

## 2014-02-12 DIAGNOSIS — S7290XD Unspecified fracture of unspecified femur, subsequent encounter for closed fracture with routine healing: Secondary | ICD-10-CM | POA: Diagnosis not present

## 2014-04-02 DIAGNOSIS — T84049A Periprosthetic fracture around unspecified internal prosthetic joint, initial encounter: Secondary | ICD-10-CM | POA: Diagnosis not present

## 2014-04-02 DIAGNOSIS — S7290XD Unspecified fracture of unspecified femur, subsequent encounter for closed fracture with routine healing: Secondary | ICD-10-CM | POA: Diagnosis not present

## 2014-04-02 DIAGNOSIS — Z96659 Presence of unspecified artificial knee joint: Secondary | ICD-10-CM | POA: Diagnosis not present

## 2014-04-02 DIAGNOSIS — IMO0001 Reserved for inherently not codable concepts without codable children: Secondary | ICD-10-CM | POA: Diagnosis not present

## 2014-04-02 DIAGNOSIS — S7290XK Unspecified fracture of unspecified femur, subsequent encounter for closed fracture with nonunion: Secondary | ICD-10-CM | POA: Insufficient documentation

## 2014-04-02 DIAGNOSIS — IMO0002 Reserved for concepts with insufficient information to code with codable children: Secondary | ICD-10-CM | POA: Diagnosis not present

## 2014-04-02 DIAGNOSIS — M259 Joint disorder, unspecified: Secondary | ICD-10-CM | POA: Diagnosis not present

## 2014-04-02 DIAGNOSIS — Z8781 Personal history of (healed) traumatic fracture: Secondary | ICD-10-CM | POA: Diagnosis not present

## 2014-04-07 ENCOUNTER — Ambulatory Visit: Payer: Medicare Other | Attending: Orthopedic Surgery | Admitting: Physical Therapy

## 2014-04-07 DIAGNOSIS — M25669 Stiffness of unspecified knee, not elsewhere classified: Secondary | ICD-10-CM | POA: Insufficient documentation

## 2014-04-07 DIAGNOSIS — M25569 Pain in unspecified knee: Secondary | ICD-10-CM | POA: Insufficient documentation

## 2014-04-07 DIAGNOSIS — IMO0001 Reserved for inherently not codable concepts without codable children: Secondary | ICD-10-CM | POA: Insufficient documentation

## 2014-04-07 DIAGNOSIS — R5381 Other malaise: Secondary | ICD-10-CM | POA: Insufficient documentation

## 2014-04-07 DIAGNOSIS — M7989 Other specified soft tissue disorders: Secondary | ICD-10-CM | POA: Diagnosis not present

## 2014-04-08 DIAGNOSIS — H35379 Puckering of macula, unspecified eye: Secondary | ICD-10-CM | POA: Diagnosis not present

## 2014-04-08 DIAGNOSIS — H35359 Cystoid macular degeneration, unspecified eye: Secondary | ICD-10-CM | POA: Diagnosis not present

## 2014-04-09 ENCOUNTER — Ambulatory Visit: Payer: Medicare Other

## 2014-04-14 ENCOUNTER — Ambulatory Visit: Payer: Medicare Other

## 2014-04-16 ENCOUNTER — Ambulatory Visit: Payer: Medicare Other | Admitting: Physical Therapy

## 2014-04-16 DIAGNOSIS — Z9660 Presence of unspecified orthopedic joint implant: Secondary | ICD-10-CM | POA: Diagnosis not present

## 2014-04-16 DIAGNOSIS — T84049A Periprosthetic fracture around unspecified internal prosthetic joint, initial encounter: Secondary | ICD-10-CM | POA: Diagnosis not present

## 2014-04-16 DIAGNOSIS — IMO0002 Reserved for concepts with insufficient information to code with codable children: Secondary | ICD-10-CM | POA: Diagnosis not present

## 2014-04-16 DIAGNOSIS — Z96659 Presence of unspecified artificial knee joint: Secondary | ICD-10-CM | POA: Diagnosis not present

## 2014-04-18 ENCOUNTER — Ambulatory Visit: Payer: Medicare Other | Admitting: Physical Therapy

## 2014-04-21 ENCOUNTER — Ambulatory Visit: Payer: Medicare Other

## 2014-04-23 ENCOUNTER — Ambulatory Visit: Payer: Medicare Other | Admitting: Physical Therapy

## 2014-04-25 ENCOUNTER — Ambulatory Visit: Payer: Medicare Other | Admitting: Physical Therapy

## 2014-05-05 ENCOUNTER — Ambulatory Visit: Payer: Medicare Other

## 2014-05-06 ENCOUNTER — Ambulatory Visit: Payer: Medicare Other

## 2014-05-08 ENCOUNTER — Ambulatory Visit: Payer: Medicare Other | Attending: Orthopedic Surgery

## 2014-05-08 DIAGNOSIS — M25669 Stiffness of unspecified knee, not elsewhere classified: Secondary | ICD-10-CM | POA: Insufficient documentation

## 2014-05-08 DIAGNOSIS — R5381 Other malaise: Secondary | ICD-10-CM | POA: Insufficient documentation

## 2014-05-08 DIAGNOSIS — M7989 Other specified soft tissue disorders: Secondary | ICD-10-CM | POA: Insufficient documentation

## 2014-05-08 DIAGNOSIS — IMO0001 Reserved for inherently not codable concepts without codable children: Secondary | ICD-10-CM | POA: Diagnosis not present

## 2014-05-08 DIAGNOSIS — M25569 Pain in unspecified knee: Secondary | ICD-10-CM | POA: Diagnosis not present

## 2014-05-13 ENCOUNTER — Ambulatory Visit: Payer: Medicare Other

## 2014-05-14 DIAGNOSIS — IMO0002 Reserved for concepts with insufficient information to code with codable children: Secondary | ICD-10-CM | POA: Diagnosis not present

## 2014-05-14 DIAGNOSIS — T84498A Other mechanical complication of other internal orthopedic devices, implants and grafts, initial encounter: Secondary | ICD-10-CM | POA: Diagnosis not present

## 2014-05-14 DIAGNOSIS — Z9889 Other specified postprocedural states: Secondary | ICD-10-CM | POA: Diagnosis not present

## 2014-05-14 DIAGNOSIS — S7290XA Unspecified fracture of unspecified femur, initial encounter for closed fracture: Secondary | ICD-10-CM | POA: Diagnosis not present

## 2014-05-14 DIAGNOSIS — Z8781 Personal history of (healed) traumatic fracture: Secondary | ICD-10-CM | POA: Diagnosis not present

## 2014-05-15 ENCOUNTER — Ambulatory Visit: Payer: Medicare Other

## 2014-05-15 DIAGNOSIS — IMO0002 Reserved for concepts with insufficient information to code with codable children: Secondary | ICD-10-CM | POA: Diagnosis not present

## 2014-05-15 DIAGNOSIS — Z96649 Presence of unspecified artificial hip joint: Secondary | ICD-10-CM | POA: Diagnosis not present

## 2014-05-15 DIAGNOSIS — T84049A Periprosthetic fracture around unspecified internal prosthetic joint, initial encounter: Secondary | ICD-10-CM | POA: Diagnosis not present

## 2014-05-15 DIAGNOSIS — Z9660 Presence of unspecified orthopedic joint implant: Secondary | ICD-10-CM | POA: Diagnosis not present

## 2014-06-16 DIAGNOSIS — M47817 Spondylosis without myelopathy or radiculopathy, lumbosacral region: Secondary | ICD-10-CM | POA: Diagnosis not present

## 2014-06-16 DIAGNOSIS — Z79899 Other long term (current) drug therapy: Secondary | ICD-10-CM | POA: Diagnosis not present

## 2014-06-16 DIAGNOSIS — I700 Atherosclerosis of aorta: Secondary | ICD-10-CM | POA: Diagnosis not present

## 2014-06-16 DIAGNOSIS — M169 Osteoarthritis of hip, unspecified: Secondary | ICD-10-CM | POA: Diagnosis not present

## 2014-06-16 DIAGNOSIS — M899 Disorder of bone, unspecified: Secondary | ICD-10-CM | POA: Diagnosis not present

## 2014-06-16 DIAGNOSIS — Z96659 Presence of unspecified artificial knee joint: Secondary | ICD-10-CM | POA: Diagnosis not present

## 2014-06-16 DIAGNOSIS — I10 Essential (primary) hypertension: Secondary | ICD-10-CM | POA: Diagnosis not present

## 2014-06-16 DIAGNOSIS — T84049A Periprosthetic fracture around unspecified internal prosthetic joint, initial encounter: Secondary | ICD-10-CM | POA: Diagnosis not present

## 2014-06-16 DIAGNOSIS — M1610 Unilateral primary osteoarthritis, unspecified hip: Secondary | ICD-10-CM | POA: Diagnosis not present

## 2014-06-16 DIAGNOSIS — Z471 Aftercare following joint replacement surgery: Secondary | ICD-10-CM | POA: Diagnosis not present

## 2014-06-16 DIAGNOSIS — IMO0002 Reserved for concepts with insufficient information to code with codable children: Secondary | ICD-10-CM | POA: Diagnosis not present

## 2014-06-16 DIAGNOSIS — Z9889 Other specified postprocedural states: Secondary | ICD-10-CM | POA: Diagnosis not present

## 2014-06-18 ENCOUNTER — Ambulatory Visit (INDEPENDENT_AMBULATORY_CARE_PROVIDER_SITE_OTHER): Payer: Medicare Other | Admitting: Family Medicine

## 2014-06-18 ENCOUNTER — Encounter: Payer: Self-pay | Admitting: Family Medicine

## 2014-06-18 VITALS — BP 140/80 | HR 88 | Temp 98.3°F | Wt 262.0 lb

## 2014-06-18 DIAGNOSIS — J209 Acute bronchitis, unspecified: Secondary | ICD-10-CM | POA: Diagnosis not present

## 2014-06-18 DIAGNOSIS — H524 Presbyopia: Secondary | ICD-10-CM | POA: Diagnosis not present

## 2014-06-18 DIAGNOSIS — IMO0002 Reserved for concepts with insufficient information to code with codable children: Secondary | ICD-10-CM

## 2014-06-18 DIAGNOSIS — Z961 Presence of intraocular lens: Secondary | ICD-10-CM | POA: Diagnosis not present

## 2014-06-18 DIAGNOSIS — H35379 Puckering of macula, unspecified eye: Secondary | ICD-10-CM | POA: Diagnosis not present

## 2014-06-18 DIAGNOSIS — S7291XK Unspecified fracture of right femur, subsequent encounter for closed fracture with nonunion: Secondary | ICD-10-CM

## 2014-06-18 NOTE — Progress Notes (Signed)
   Subjective:    Patient ID: Paul Townsend, male    DOB: 01-10-1946, 68 y.o.   MRN: [id number]  Cough Pertinent negatives include no chest pain, chills, fever, shortness of breath or wheezing.   Acute respiratory illness. Started about 2 weeks ago with cough. Overall feels better this time. Cough is mostly nonproductive. No fever. Initially had cough which was productive but now clear. No wheezing. No dyspnea. Using over-the-counter Mucinex. Nonsmoker.  Another issue is patient had complicated right femur fracture back in [place] falling on ice. He had open reduction/internal fixation. He has also had bilateral knee replacements. 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 They recommended considering repeat surgery to stabilize his fracture. He would like to consider second opinion.  He is ambulating with crutches and denies any significant pain.  Past Medical History  Diagnosis Date  . Arthritis   . Hyperlipidemia   . Hypertension    Past Surgical History  Procedure Laterality Date  . Total knee arthroplasty    . Varicose vein surgery    . Tonsillectomy      reports that he has quit smoking. He has never used smokeless tobacco. He reports that he drinks alcohol. His drug history is not on file. family history includes Arthritis in his father; Cancer in his mother; Heart disease (age of onset: 58) in his father; Hypertension in his father. No Known Allergies    Review of Systems  Constitutional: Negative for fever, chills, appetite change and unexpected weight change.  HENT: Negative for congestion.   Respiratory: Positive for cough. Negative for shortness of breath and wheezing.   Cardiovascular: Negative for chest pain.  Gastrointestinal: Negative for abdominal pain.  Genitourinary: Negative for dysuria.       Objective:   Physical Exam  Constitutional: He appears well-developed and well-nourished.  HENT:  Right Ear:  External ear normal.  Left Ear: External ear normal.  Mouth/Throat: Oropharynx is clear and moist.  Neck: Neck supple.  Cardiovascular: Normal rate and regular rhythm.   Pulmonary/Chest: Effort normal and breath sounds normal. No respiratory distress. He has no wheezes. He has no rales.  Lymphadenopathy:    He has no cervical adenopathy.          Assessment & Plan:  #1 cough. Suspect acute viral bronchitis. Recommend symptomatic treatment. Followup promptly for fever or worsening symptoms #2 complicated right distal femur fracture. Nonunion. Patient requesting second opinion. We'll consider referral to tertiary center-possibly [facility].

## 2014-06-18 NOTE — Patient Instructions (Signed)

## 2014-06-18 NOTE — Progress Notes (Signed)
Pre visit review using our clinic review tool, if applicable. No additional management support is needed unless otherwise documented below in the visit note. 

## 2014-06-26 ENCOUNTER — Telehealth: Payer: Self-pay | Admitting: Family Medicine

## 2014-06-26 DIAGNOSIS — S7292XP Unspecified fracture of left femur, subsequent encounter for closed fracture with malunion: Secondary | ICD-10-CM

## 2014-06-26 NOTE — Telephone Encounter (Signed)
Pt would like Dr Elease Hashimoto to call him about. Dr Elease Hashimoto was suppose to contact a doctor about his knee. [phone]

## 2014-06-27 NOTE — Telephone Encounter (Signed)
Spoke with patient. We'll set up referral to [facility] for further evaluation

## 2014-07-07 DIAGNOSIS — M79609 Pain in unspecified limb: Secondary | ICD-10-CM | POA: Diagnosis not present

## 2014-07-07 DIAGNOSIS — IMO0002 Reserved for concepts with insufficient information to code with codable children: Secondary | ICD-10-CM | POA: Diagnosis not present

## 2014-07-07 DIAGNOSIS — M949 Disorder of cartilage, unspecified: Secondary | ICD-10-CM | POA: Diagnosis not present

## 2014-07-07 DIAGNOSIS — M899 Disorder of bone, unspecified: Secondary | ICD-10-CM | POA: Diagnosis not present

## 2014-07-07 DIAGNOSIS — S72409A Unspecified fracture of lower end of unspecified femur, initial encounter for closed fracture: Secondary | ICD-10-CM | POA: Diagnosis not present

## 2014-07-07 DIAGNOSIS — S72443A Displaced fracture of lower epiphysis (separation) of unspecified femur, initial encounter for closed fracture: Secondary | ICD-10-CM | POA: Diagnosis not present

## 2014-07-23 DIAGNOSIS — IMO0002 Reserved for concepts with insufficient information to code with codable children: Secondary | ICD-10-CM | POA: Diagnosis not present

## 2014-07-23 DIAGNOSIS — Z8781 Personal history of (healed) traumatic fracture: Secondary | ICD-10-CM | POA: Diagnosis not present

## 2014-07-23 DIAGNOSIS — S7290XD Unspecified fracture of unspecified femur, subsequent encounter for closed fracture with routine healing: Secondary | ICD-10-CM | POA: Diagnosis not present

## 2014-08-18 ENCOUNTER — Other Ambulatory Visit: Payer: Self-pay | Admitting: Family Medicine

## 2014-08-22 ENCOUNTER — Encounter: Payer: TRICARE For Life (TFL) | Admitting: Family Medicine

## 2014-09-09 DIAGNOSIS — S72461K Displaced supracondylar fracture with intracondylar extension of lower end of right femur, subsequent encounter for closed fracture with nonunion: Secondary | ICD-10-CM | POA: Diagnosis not present

## 2014-09-09 DIAGNOSIS — Z967 Presence of other bone and tendon implants: Secondary | ICD-10-CM | POA: Diagnosis not present

## 2014-10-25 ENCOUNTER — Other Ambulatory Visit: Payer: Self-pay | Admitting: Family Medicine

## 2014-10-26 ENCOUNTER — Other Ambulatory Visit: Payer: Self-pay | Admitting: Family Medicine

## 2014-10-29 ENCOUNTER — Ambulatory Visit (INDEPENDENT_AMBULATORY_CARE_PROVIDER_SITE_OTHER): Payer: Medicare Other | Admitting: *Deleted

## 2014-10-29 DIAGNOSIS — Z23 Encounter for immunization: Secondary | ICD-10-CM | POA: Diagnosis not present

## 2014-11-24 ENCOUNTER — Ambulatory Visit: Payer: Medicare Other | Attending: Orthopedic Surgery

## 2014-11-24 DIAGNOSIS — R269 Unspecified abnormalities of gait and mobility: Secondary | ICD-10-CM | POA: Insufficient documentation

## 2014-11-24 DIAGNOSIS — Z8781 Personal history of (healed) traumatic fracture: Secondary | ICD-10-CM | POA: Diagnosis not present

## 2014-11-24 DIAGNOSIS — R262 Difficulty in walking, not elsewhere classified: Secondary | ICD-10-CM | POA: Insufficient documentation

## 2014-11-24 DIAGNOSIS — M6281 Muscle weakness (generalized): Secondary | ICD-10-CM | POA: Diagnosis not present

## 2014-11-24 DIAGNOSIS — Z4789 Encounter for other orthopedic aftercare: Secondary | ICD-10-CM | POA: Diagnosis not present

## 2014-11-27 ENCOUNTER — Ambulatory Visit: Payer: Medicare Other

## 2014-11-27 DIAGNOSIS — M6281 Muscle weakness (generalized): Secondary | ICD-10-CM | POA: Diagnosis not present

## 2014-11-27 DIAGNOSIS — Z8781 Personal history of (healed) traumatic fracture: Secondary | ICD-10-CM | POA: Diagnosis not present

## 2014-11-27 DIAGNOSIS — R262 Difficulty in walking, not elsewhere classified: Secondary | ICD-10-CM | POA: Diagnosis not present

## 2014-11-27 DIAGNOSIS — Z4789 Encounter for other orthopedic aftercare: Secondary | ICD-10-CM | POA: Diagnosis not present

## 2014-11-27 DIAGNOSIS — R269 Unspecified abnormalities of gait and mobility: Secondary | ICD-10-CM | POA: Diagnosis not present

## 2014-12-01 ENCOUNTER — Ambulatory Visit: Payer: Medicare Other

## 2014-12-03 ENCOUNTER — Ambulatory Visit: Payer: Medicare Other

## 2014-12-03 DIAGNOSIS — M6281 Muscle weakness (generalized): Secondary | ICD-10-CM | POA: Diagnosis not present

## 2014-12-03 DIAGNOSIS — R269 Unspecified abnormalities of gait and mobility: Secondary | ICD-10-CM | POA: Diagnosis not present

## 2014-12-03 DIAGNOSIS — R262 Difficulty in walking, not elsewhere classified: Secondary | ICD-10-CM | POA: Diagnosis not present

## 2014-12-03 DIAGNOSIS — Z4789 Encounter for other orthopedic aftercare: Secondary | ICD-10-CM | POA: Diagnosis not present

## 2014-12-03 DIAGNOSIS — Z8781 Personal history of (healed) traumatic fracture: Secondary | ICD-10-CM | POA: Diagnosis not present

## 2014-12-04 DIAGNOSIS — S72461K Displaced supracondylar fracture with intracondylar extension of lower end of right femur, subsequent encounter for closed fracture with nonunion: Secondary | ICD-10-CM | POA: Diagnosis not present

## 2014-12-04 DIAGNOSIS — T84048D Periprosthetic fracture around other internal prosthetic joint, subsequent encounter: Secondary | ICD-10-CM | POA: Diagnosis not present

## 2014-12-04 DIAGNOSIS — Z967 Presence of other bone and tendon implants: Secondary | ICD-10-CM | POA: Diagnosis not present

## 2014-12-08 ENCOUNTER — Ambulatory Visit: Payer: Medicare Other | Attending: Orthopedic Surgery

## 2014-12-08 DIAGNOSIS — Z4789 Encounter for other orthopedic aftercare: Secondary | ICD-10-CM | POA: Diagnosis not present

## 2014-12-08 DIAGNOSIS — Z8781 Personal history of (healed) traumatic fracture: Secondary | ICD-10-CM | POA: Diagnosis not present

## 2014-12-08 DIAGNOSIS — M6281 Muscle weakness (generalized): Secondary | ICD-10-CM | POA: Insufficient documentation

## 2014-12-08 DIAGNOSIS — R269 Unspecified abnormalities of gait and mobility: Secondary | ICD-10-CM | POA: Insufficient documentation

## 2014-12-08 DIAGNOSIS — R262 Difficulty in walking, not elsewhere classified: Secondary | ICD-10-CM | POA: Insufficient documentation

## 2014-12-10 ENCOUNTER — Ambulatory Visit: Payer: Medicare Other

## 2014-12-10 DIAGNOSIS — M6281 Muscle weakness (generalized): Secondary | ICD-10-CM | POA: Diagnosis not present

## 2014-12-10 DIAGNOSIS — R262 Difficulty in walking, not elsewhere classified: Secondary | ICD-10-CM | POA: Diagnosis not present

## 2014-12-10 DIAGNOSIS — Z8781 Personal history of (healed) traumatic fracture: Secondary | ICD-10-CM | POA: Diagnosis not present

## 2014-12-10 DIAGNOSIS — Z4789 Encounter for other orthopedic aftercare: Secondary | ICD-10-CM | POA: Diagnosis not present

## 2014-12-10 DIAGNOSIS — R269 Unspecified abnormalities of gait and mobility: Secondary | ICD-10-CM | POA: Diagnosis not present

## 2014-12-15 ENCOUNTER — Ambulatory Visit: Payer: Medicare Other

## 2014-12-15 DIAGNOSIS — R269 Unspecified abnormalities of gait and mobility: Secondary | ICD-10-CM | POA: Diagnosis not present

## 2014-12-15 DIAGNOSIS — Z4789 Encounter for other orthopedic aftercare: Secondary | ICD-10-CM | POA: Diagnosis not present

## 2014-12-15 DIAGNOSIS — R262 Difficulty in walking, not elsewhere classified: Secondary | ICD-10-CM | POA: Diagnosis not present

## 2014-12-15 DIAGNOSIS — M6281 Muscle weakness (generalized): Secondary | ICD-10-CM | POA: Diagnosis not present

## 2014-12-15 DIAGNOSIS — Z8781 Personal history of (healed) traumatic fracture: Secondary | ICD-10-CM | POA: Diagnosis not present

## 2014-12-22 ENCOUNTER — Ambulatory Visit: Payer: Medicare Other

## 2014-12-23 ENCOUNTER — Ambulatory Visit: Payer: Medicare Other

## 2014-12-23 DIAGNOSIS — M6281 Muscle weakness (generalized): Secondary | ICD-10-CM | POA: Diagnosis not present

## 2014-12-23 DIAGNOSIS — R262 Difficulty in walking, not elsewhere classified: Secondary | ICD-10-CM

## 2014-12-23 DIAGNOSIS — Z4789 Encounter for other orthopedic aftercare: Secondary | ICD-10-CM | POA: Diagnosis not present

## 2014-12-23 DIAGNOSIS — Z8781 Personal history of (healed) traumatic fracture: Secondary | ICD-10-CM | POA: Diagnosis not present

## 2014-12-23 DIAGNOSIS — R269 Unspecified abnormalities of gait and mobility: Secondary | ICD-10-CM

## 2014-12-23 NOTE — Therapy (Signed)
[facility] [place], [place], [place], [address] Phone: [phone]   Fax:  [phone]  Physical Therapy Treatment  Patient Details  Name: Paul Townsend MRN: [id number] Date of Birth: 11-04-1946 Referring Provider:  Eulas Post, MD  Encounter Date: 12/23/2014      PT End of Session - 12/23/14 0959    Visit Number 7   Date for PT Re-Evaluation 01/16/15   PT Start Time 0932   PT Stop Time 1016   PT Time Calculation (min) 44 min   Activity Tolerance Patient tolerated treatment well   Behavior During Therapy [facility] for tasks assessed/performed      Past Medical History  Diagnosis Date  . Arthritis   . Hyperlipidemia   . Hypertension     Past Surgical History  Procedure Laterality Date  . Total knee arthroplasty    . Varicose vein surgery    . Tonsillectomy    . Orif rt femur Right 01/05/2014    There were no vitals taken for this visit.  Visit Diagnosis:  Muscle weakness of lower extremity  Abnormality of gait  Difficulty walking      Subjective Assessment - 12/23/14 0937    Symptoms I'm walking better   Currently in Pain? No/denies   Multiple Pain Sites No          [facility] PT Assessment - 12/23/14 0001    Assessment   Medical Diagnosis s/p Rt ORIF   Onset Date 01/05/14   Precautions   Precautions Other (comment)  protective weightbearing on Rt LE (no leg press)   Restrictions   Weight Bearing Restrictions Yes   RLE Weight Bearing --  protective weight bearing per MD   Balance Screen   Has the patient fallen in the past 6 months No   Has the patient had a decrease in activity level because of a fear of falling?  No   Is the patient reluctant to leave their home because of a fear of falling?  No   Prior Function   Level of Independence Independent with basic ADLs   Vocation Part time employment   Cognition   Overall Cognitive Status Within Functional Limits for tasks  assessed                  [facility] Adult PT Treatment/Exercise - 12/23/14 0001    Balance   Balance Assessed No   Exercises   Exercises Knee/Hip   Knee/Hip Exercises: Aerobic   Stationary Bike Level 3 x8 minutes   Knee/Hip Exercises: Standing   Forward Step Up Right;3 sets;10 reps   Walking with Sports Cord 4 ways 30# each way x 10 each  verbal cues for heel to toe gait   Other Standing Knee Exercises Stand on Rt LE and perform Lt hip abduction and extension with red band 2x15 each   Other Standing Knee Exercises mini tramp- weight shifting 3 ways x 1 minute each                  PT Short Term Goals - 12/23/14 0943    PT SHORT TERM GOAL #1   Title independnet with initial HEP   Time 4   Status New   PT SHORT TERM GOAL #2   Title demonstrate symmetry with gait on level surface   Time 4   Period Weeks   Status New   PT SHORT TERM GOAL #3   Title ascend and descend steps with  symmetry   Time 4   Period Weeks   Status New           PT Long Term Goals - 12/23/14 0944    PT LONG TERM GOAL #1   Title verbalize understanding of importance of physical activity   Time 8   Period Weeks   Status New   PT LONG TERM GOAL #2   Title be independent with advanced HEP   Time 8   Period Weeks   Status New   PT LONG TERM GOAL #3   Title reduce FOTO to < or = to 32% limitation   Time 8   Period Weeks   Status New   PT LONG TERM GOAL #4   Title demonstrate symmetry with gait at the end of the day   Time 8   Period Weeks   Status New   PT LONG TERM GOAL #5   Title improve LE strength to allow for step-over-step with ascending and descending steps >90% of the time   Time 8   Period Weeks   Status New               Plan - 12/23/14 9244    Clinical Impression Statement Pt with minimal assymmetry with gait on level surface. Pt with asssymmetry as the day progress and with fatigue.  Pt ascends steps with step-over-step with use of on rail and step-to  gait with descending.    Pt will benefit from skilled therapeutic intervention in order to improve on the following deficits Abnormal gait;Decreased activity tolerance;Decreased endurance;Decreased strength   Rehab Potential Good   PT Frequency 2x / week   PT Duration 8 weeks   PT Treatment/Interventions Balance training;Functional mobility training;Gait training;Therapeutic exercise   PT Next Visit Plan Continue to address gait pattern and functional Rt LE strength   Consulted and Agree with Plan of Care Patient        Problem List Patient Active Problem List   Diagnosis Date Noted  . Obesity (BMI 30-39.9) 08/19/2013  . Constipation 11/04/2011  . HYPERLIPIDEMIA 08/23/2010  . HYPERTENSION 08/23/2010  . 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 [place], [place], [place], [address] Phone: [phone]   Fax:  [phone]

## 2014-12-24 ENCOUNTER — Ambulatory Visit: Payer: Medicare Other

## 2014-12-24 DIAGNOSIS — Z4789 Encounter for other orthopedic aftercare: Secondary | ICD-10-CM | POA: Diagnosis not present

## 2014-12-24 DIAGNOSIS — M6281 Muscle weakness (generalized): Secondary | ICD-10-CM | POA: Diagnosis not present

## 2014-12-24 DIAGNOSIS — R269 Unspecified abnormalities of gait and mobility: Secondary | ICD-10-CM

## 2014-12-24 DIAGNOSIS — Z8781 Personal history of (healed) traumatic fracture: Secondary | ICD-10-CM | POA: Diagnosis not present

## 2014-12-24 DIAGNOSIS — R262 Difficulty in walking, not elsewhere classified: Secondary | ICD-10-CM | POA: Diagnosis not present

## 2014-12-24 NOTE — Therapy (Signed)
[facility] [address] [place], [place], [place], [address] Phone: [phone]   Fax:  [phone]  Physical Therapy Treatment  Patient Details  Name: Paul Townsend MRN: [id number] Date of Birth: [birth year] Referring Provider:  Eulas Post, MD  Encounter Date: 12/24/2014      PT End of Session - 12/24/14 1216    PT Start Time 1146      Past Medical History  Diagnosis Date  . Arthritis   . Hyperlipidemia   . Hypertension     Past Surgical History  Procedure Laterality Date  . Total knee arthroplasty    . Varicose vein surgery    . Tonsillectomy    . Orif rt femur Right 01/05/2014    There were no vitals taken for this visit.  Visit Diagnosis:  Muscle weakness of lower extremity  Abnormality of gait  Difficulty walking      Subjective Assessment - 12/24/14 1146    Symptoms Feeling good today   Currently in Pain? No/denies   Multiple Pain Sites No                    [facility] Adult PT Treatment/Exercise - 12/24/14 0001    Ambulation/Gait   Ambulation/Gait No   Exercises   Exercises Knee/Hip   Knee/Hip Exercises: Aerobic   Stationary Bike Level 4 x 10 minutes   Tread Mill 1.1 mph, 1 UE support x 5 minutes  verbal cues for step length and heel to toe gait   Knee/Hip Exercises: Standing   Forward Step Up 3 sets;10 reps  Bosu Ball   Walking with Sports Cord 4 ways 35# forward and reverse x 10 each, 30# sidestepping  pt tolerated increased weight well   Other Standing Knee Exercises Stand on Rt LE and perform Lt hip abduction and extension with red band 2x15 each   Other Standing Knee Exercises mini tramp- weight shifting 3 ways x 1 minute each                  PT Short Term Goals - 12/23/14 0943    PT SHORT TERM GOAL #1   Title independnet with initial HEP   Time 4   Status New   PT SHORT TERM GOAL #2   Title demonstrate symmetry with gait on level surface   Time 4    Period Weeks   Status New   PT SHORT TERM GOAL #3   Title ascend and descend steps with symmetry   Time 4   Period Weeks   Status New           PT Long Term Goals - 12/23/14 0944    PT LONG TERM GOAL #1   Title verbalize understanding of importance of physical activity   Time 8   Period Weeks   Status New   PT LONG TERM GOAL #2   Title be independent with advanced HEP   Time 8   Period Weeks   Status New   PT LONG TERM GOAL #3   Title reduce FOTO to < or = to 32% limitation   Time 8   Period Weeks   Status New   PT LONG TERM GOAL #4   Title demonstrate symmetry with gait at the end of the day   Time 8   Period Weeks   Status New   PT LONG TERM GOAL #5   Title improve LE strength to allow for step-over-step with ascending  and descending steps >90% of the time   Time 8   Period Weeks   Status New               Plan - 12/24/14 1216    Clinical Impression Statement Pt able to walk on treadmill with only 1 UE support and minimal to no gait assymmetry today.  Pt tolerated increased weight and reps well today.   Pt will benefit from skilled therapeutic intervention in order to improve on the following deficits Abnormal gait;Difficulty walking;Decreased endurance;Decreased strength   Rehab Potential Good   PT Next Visit Plan Continue to challenge gait, sterngth and proprioception   Consulted and Agree with Plan of Care Patient        Problem List Patient Active Problem List   Diagnosis Date Noted  . Obesity (BMI 30-39.9) 08/19/2013  . Constipation 11/04/2011  . HYPERLIPIDEMIA 08/23/2010  . HYPERTENSION 08/23/2010  . 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

## 2015-01-06 ENCOUNTER — Ambulatory Visit: Payer: Medicare Other | Attending: Orthopedic Surgery

## 2015-01-06 DIAGNOSIS — H43813 Vitreous degeneration, bilateral: Secondary | ICD-10-CM | POA: Diagnosis not present

## 2015-01-06 DIAGNOSIS — H33303 Unspecified retinal break, bilateral: Secondary | ICD-10-CM | POA: Diagnosis not present

## 2015-01-06 DIAGNOSIS — Z8781 Personal history of (healed) traumatic fracture: Secondary | ICD-10-CM | POA: Insufficient documentation

## 2015-01-06 DIAGNOSIS — R269 Unspecified abnormalities of gait and mobility: Secondary | ICD-10-CM | POA: Diagnosis not present

## 2015-01-06 DIAGNOSIS — R262 Difficulty in walking, not elsewhere classified: Secondary | ICD-10-CM | POA: Insufficient documentation

## 2015-01-06 DIAGNOSIS — M6281 Muscle weakness (generalized): Secondary | ICD-10-CM | POA: Diagnosis not present

## 2015-01-06 DIAGNOSIS — H35373 Puckering of macula, bilateral: Secondary | ICD-10-CM | POA: Diagnosis not present

## 2015-01-06 DIAGNOSIS — Z4789 Encounter for other orthopedic aftercare: Secondary | ICD-10-CM | POA: Insufficient documentation

## 2015-01-06 NOTE — Therapy (Signed)
[facility] [address] [place], [place], [address] Phone: [phone]   Fax:  [phone]  Physical Therapy Treatment  Patient Details  Name: Paul Townsend MRN: [id number] Date of Birth: 1946/04/07 Referring Provider:  Eulas Post, MD  Encounter Date: 01/06/2015      PT End of Session - 01/06/15 1411    Visit Number 9   Number of Visits --  10 medicare   Date for PT Re-Evaluation 01/16/15   PT Start Time 1400   PT Stop Time 1443   PT Time Calculation (min) 43 min   Activity Tolerance Patient tolerated treatment well   Behavior During Therapy [facility] for tasks assessed/performed      Past Medical History  Diagnosis Date  . Arthritis   . Hyperlipidemia   . Hypertension     Past Surgical History  Procedure Laterality Date  . Total knee arthroplasty    . Varicose vein surgery    . Tonsillectomy    . Orif rt femur Right 01/05/2014    There were no vitals taken for this visit.  Visit Diagnosis:  Muscle weakness of lower extremity  Abnormality of gait      Subjective Assessment - 01/06/15 1402    Symptoms Pt was away for a speaking engagement and did will with standing long periods.     Currently in Pain? No/denies   Multiple Pain Sites No          [facility] PT Assessment - 01/06/15 0001    Observation/Other Assessments   Focus on Therapeutic Outcomes (FOTO)  26% limitation                  [facility] Adult PT Treatment/Exercise - 01/06/15 0001    Knee/Hip Exercises: Aerobic   Stationary Bike Level 5x 10 minutes  Pt tolerated increased resistance well today   Knee/Hip Exercises: Standing   Forward Step Up 3 sets;10 reps  Bosu Ball   Step Down 2 sets;10 reps;Right;Hand Hold: 1;Step Height: 8"   SLS on blue theraband pad 10x10 seconds   Walking with Sports Cord 4 ways 35# forward and reverse x 10 each, 30# sidestepping  pt tolerated increased weight well                   PT Short Term Goals - 01/06/15 1407    PT SHORT TERM GOAL #1   Title independnet with initial HEP   Time 4   Status Achieved   PT SHORT TERM GOAL #2   Title demonstrate symmetry with gait on level surface   Time 4   Period Weeks   Status Achieved  short distance ambulation on level surface is symmetrical   PT SHORT TERM GOAL #3   Title ascend and descend steps with symmetry   Time 4   Period Weeks   Status Achieved  Use of 1 rail and step-over step with symmetry           PT Long Term Goals - 01/06/15 1408    PT LONG TERM GOAL #1   Title verbalize understanding of importance of physical activity   Time 8   Period Weeks   Status Achieved   PT LONG TERM GOAL #2   Title be independent with advanced HEP   Time 8   Period Weeks   Status On-going  independent with current HEP   PT LONG TERM GOAL #4   Title demonstrate symmetry with gait at the  end of the day   Time 8   Period Weeks   Status On-going  gait becomes antalgic with fatigue at the end of the day   PT LONG TERM GOAL #5   Title improve LE strength to allow for step-over-step with ascending and descending steps >90% of the time   Time 8   Period Weeks   Status Achieved               Plan - 01/06/15 1420    Clinical Impression Statement Pt with improved gait pattern and denies significant limitation except mild antalgia at the end of the day.     Pt will benefit from skilled therapeutic intervention in order to improve on the following deficits Abnormal gait;Difficulty walking;Decreased endurance;Decreased strength   Rehab Potential Good   PT Treatment/Interventions Balance training;Functional mobility training;Gait training;Therapeutic exercise   PT Next Visit Plan G-codes (FOTO 26% limitation) and probably D/C next session to HEP. D/C FOTO   PT Home Exercise Plan review   Consulted and Agree with Plan of Care Patient        Problem List Patient Active Problem List   Diagnosis Date Noted   . Obesity (BMI 30-39.9) 08/19/2013  . Constipation 11/04/2011  . HYPERLIPIDEMIA 08/23/2010  . HYPERTENSION 08/23/2010  . IMPOTENCE OF ORGANIC ORIGIN 08/23/2010    TAKACS,KELLY, PT 01/06/2015, 2:48 PM  [facility] [address], [place], [place], [address] Phone: [phone]   Fax:  [phone]

## 2015-01-08 ENCOUNTER — Ambulatory Visit: Payer: Medicare Other

## 2015-01-08 DIAGNOSIS — Z4789 Encounter for other orthopedic aftercare: Secondary | ICD-10-CM | POA: Diagnosis not present

## 2015-01-08 DIAGNOSIS — R262 Difficulty in walking, not elsewhere classified: Secondary | ICD-10-CM

## 2015-01-08 DIAGNOSIS — R269 Unspecified abnormalities of gait and mobility: Secondary | ICD-10-CM

## 2015-01-08 DIAGNOSIS — M6281 Muscle weakness (generalized): Secondary | ICD-10-CM | POA: Diagnosis not present

## 2015-01-08 DIAGNOSIS — Z8781 Personal history of (healed) traumatic fracture: Secondary | ICD-10-CM | POA: Diagnosis not present

## 2015-01-08 NOTE — Therapy (Signed)
[facility] [address], [place], [place], [address] Phone: [phone]   Fax:  [phone]  Physical Therapy Treatment  Patient Details  Name: Paul Townsend MRN: [id number] Date of Birth: 1946/10/27 Referring Provider:  Eulas Post, MD  Encounter Date: 01/08/2015      PT End of Session - 01/08/15 1044    Visit Number 10   PT Start Time 8338   PT Stop Time 1049   PT Time Calculation (min) 35 min   Activity Tolerance Patient tolerated treatment well   Behavior During Therapy [facility] for tasks assessed/performed      Past Medical History  Diagnosis Date  . Arthritis   . Hyperlipidemia   . Hypertension     Past Surgical History  Procedure Laterality Date  . Total knee arthroplasty    . Varicose vein surgery    . Tonsillectomy    . Orif rt femur Right 01/05/2014    There were no vitals taken for this visit.  Visit Diagnosis:  Muscle weakness of lower extremity  Abnormality of gait  Difficulty walking      Subjective Assessment - 01/08/15 1012    Symptoms Ready for discharge.  Doing HEP for continued strength   Currently in Pain? No/denies          [facility] PT Assessment - 01/08/15 0001    Observation/Other Assessments   Focus on Therapeutic Outcomes (FOTO)  26% limitation   ROM / Strength   AROM / PROM / Strength Strength   Strength   Overall Strength Deficits   Strength Assessment Site Knee;Hip   Right/Left Hip Right   Right Hip Flexion 4+/5   Right Hip ABduction 4+/5   Right/Left Knee Right   Right Knee Flexion 5/5   Right Knee Extension 4+/5                  [facility] Adult PT Treatment/Exercise - 01/08/15 0001    Knee/Hip Exercises: Aerobic   Stationary Bike Level 5x 10 minutes  PT present to discuss D/C with patient   [place] sidestepping 0.8 mph x2 minutes each direction   Knee/Hip Exercises: Standing   Forward Step Up 3 sets;10 reps  Bosu Ball   Step Down 2  sets;10 reps;Right;Hand Hold: 1;Step Height: 8"   Walking with Sports Cord 4 ways 35# forward and reverse x 10 each, 30# sidestepping  pt tolerated increased weight well   Other Standing Knee Exercises Stand on Rt LE and perform Lt hip abduction and extension with red band 2x15 each                  PT Short Term Goals - 01/06/15 1407    PT SHORT TERM GOAL #1   Title independnet with initial HEP   Time 4   Status Achieved   PT SHORT TERM GOAL #2   Title demonstrate symmetry with gait on level surface   Time 4   Period Weeks   Status Achieved  short distance ambulation on level surface is symmetrical   PT SHORT TERM GOAL #3   Title ascend and descend steps with symmetry   Time 4   Period Weeks   Status Achieved  Use of 1 rail and step-over step with symmetry           PT Long Term Goals - 01/08/15 1013    PT LONG TERM GOAL #1   Title verbalize understanding of importance of physical activity  Status Achieved   PT LONG TERM GOAL #2   Title be independent with advanced HEP   Status Achieved   PT LONG TERM GOAL #3   Title reduce FOTO to < or = to 32% limitation   Status Achieved  28% limitation   PT LONG TERM GOAL #4   Title demonstrate symmetry with gait at the end of the day   Status Partially Met  Pt with mild antalgia with fatigue at the end of the day   PT LONG TERM GOAL #5   Title improve LE strength to allow for step-over-step with ascending and descending steps >90% of the time   Status Achieved               Plan - 2015-02-07 1014    Clinical Impression Statement Pt denies significant functional limitation and gait pattern is minimally altered when Pt is fatigued.  Pt ready for D/C and will follow-up with MD at the end of March.   PT Next Visit Plan D/C PT today to HEP   Consulted and Agree with Plan of Care Patient          G-Codes - 07-Feb-2015 1011    Functional Assessment Tool Used FOTO: 26% limitation   Functional Limitation  Mobility: Walking and moving around   Mobility: Walking and Moving Around Goal Status [phone]) At least 20 percent but less than 40 percent impaired, limited or restricted   Mobility: Walking and Moving Around Discharge Status [phone]) At least 20 percent but less than 40 percent impaired, limited or restricted      Problem List Patient Active Problem List   Diagnosis Date Noted  . Obesity (BMI 30-39.9) 08/19/2013  . Constipation 11/04/2011  . HYPERLIPIDEMIA 08/23/2010  . HYPERTENSION 08/23/2010  . IMPOTENCE OF ORGANIC ORIGIN 08/23/2010  PHYSICAL THERAPY DISCHARGE SUMMARY  Visits from Start of Care: 10  Current functional level related to goals / functional outcomes: See above for goal status.     Remaining deficits: Pt with mild antalgia at the end of the day or when fatigued.  Rt LE strength deficits.  Pt has HEP in place to address remaining deficits.  Thank you for this referral.     Education / Equipment: HEP, gait training Plan: Patient agrees to discharge.  Patient goals were met. Patient is being discharged due to meeting the stated rehab goals.  ?????      TAKACS,KELLY, PT 07-Feb-2015, 10:49 AM  [facility] [address], [place], [place], [address] Phone: [phone]   Fax:  [phone]

## 2015-01-20 ENCOUNTER — Other Ambulatory Visit: Payer: Self-pay | Admitting: Family Medicine

## 2015-02-03 DIAGNOSIS — S72461K Displaced supracondylar fracture with intracondylar extension of lower end of right femur, subsequent encounter for closed fracture with nonunion: Secondary | ICD-10-CM | POA: Diagnosis not present

## 2015-02-03 DIAGNOSIS — Z96651 Presence of right artificial knee joint: Secondary | ICD-10-CM | POA: Diagnosis not present

## 2015-02-03 DIAGNOSIS — T84048D Periprosthetic fracture around other internal prosthetic joint, subsequent encounter: Secondary | ICD-10-CM | POA: Diagnosis not present

## 2015-03-31 DIAGNOSIS — S72461K Displaced supracondylar fracture with intracondylar extension of lower end of right femur, subsequent encounter for closed fracture with nonunion: Secondary | ICD-10-CM | POA: Diagnosis not present

## 2015-03-31 DIAGNOSIS — S72461D Displaced supracondylar fracture with intracondylar extension of lower end of right femur, subsequent encounter for closed fracture with routine healing: Secondary | ICD-10-CM | POA: Diagnosis not present

## 2015-03-31 DIAGNOSIS — T84048D Periprosthetic fracture around other internal prosthetic joint, subsequent encounter: Secondary | ICD-10-CM | POA: Diagnosis not present

## 2015-03-31 DIAGNOSIS — Z967 Presence of other bone and tendon implants: Secondary | ICD-10-CM | POA: Diagnosis not present

## 2015-03-31 DIAGNOSIS — Z96651 Presence of right artificial knee joint: Secondary | ICD-10-CM | POA: Diagnosis not present

## 2015-03-31 DIAGNOSIS — M61551 Other ossification of muscle, right thigh: Secondary | ICD-10-CM | POA: Diagnosis not present

## 2015-04-20 ENCOUNTER — Other Ambulatory Visit: Payer: Self-pay | Admitting: Family Medicine

## 2015-05-07 ENCOUNTER — Other Ambulatory Visit (INDEPENDENT_AMBULATORY_CARE_PROVIDER_SITE_OTHER): Payer: Medicare Other

## 2015-05-07 DIAGNOSIS — I10 Essential (primary) hypertension: Secondary | ICD-10-CM

## 2015-05-07 DIAGNOSIS — E785 Hyperlipidemia, unspecified: Secondary | ICD-10-CM

## 2015-05-07 DIAGNOSIS — N320 Bladder-neck obstruction: Secondary | ICD-10-CM | POA: Diagnosis not present

## 2015-05-07 DIAGNOSIS — Z0000 Encounter for general adult medical examination without abnormal findings: Secondary | ICD-10-CM | POA: Diagnosis not present

## 2015-05-07 LAB — COMPREHENSIVE METABOLIC PANEL
ALT: 19 U/L (ref 0–53)
AST: 20 U/L (ref 0–37)
Albumin: 4.1 g/dL (ref 3.5–5.2)
Alkaline Phosphatase: 63 U/L (ref 39–117)
BUN: 13 mg/dL (ref 6–23)
CALCIUM: 9.4 mg/dL (ref 8.4–10.5)
CO2: 29 meq/L (ref 19–32)
Chloride: 105 mEq/L (ref 96–112)
Creatinine, Ser: 1.04 mg/dL (ref 0.40–1.50)
GFR: 75.03 mL/min (ref >60.00)
Glucose, Bld: 118 mg/dL — ABNORMAL HIGH (ref 70–99)
POTASSIUM: 4.2 meq/L (ref 3.5–5.1)
Sodium: 141 mEq/L (ref 135–145)
Total Bilirubin: 1.2 mg/dL (ref 0.2–1.2)
Total Protein: 6.8 g/dL (ref 6.0–8.3)

## 2015-05-07 LAB — LIPID PANEL
CHOL/HDL RATIO: 3
Cholesterol: 160 mg/dL (ref 0–200)
HDL: 46.8 mg/dL (ref >39.00)
LDL Cholesterol: 81 mg/dL (ref 0–99)
NonHDL: 113.2
Triglycerides: 163 mg/dL — ABNORMAL HIGH (ref 0.0–149.0)
VLDL: 32.6 mg/dL (ref 0.0–40.0)

## 2015-05-07 LAB — CBC WITH DIFFERENTIAL/PLATELET
BASOS PCT: 0.7 % (ref 0.0–3.0)
Basophils Absolute: 0 10*3/uL (ref 0.0–0.1)
Eosinophils Absolute: 0.4 10*3/uL (ref 0.0–0.7)
Eosinophils Relative: 5.2 % — ABNORMAL HIGH (ref 0.0–5.0)
HEMATOCRIT: 47.3 % (ref 39.0–52.0)
HEMOGLOBIN: 15.8 g/dL (ref 13.0–17.0)
LYMPHS ABS: 1.8 10*3/uL (ref 0.7–4.0)
LYMPHS PCT: 26.5 % (ref 12.0–46.0)
MCHC: 33.5 g/dL (ref 30.0–36.0)
MCV: 93.2 fl (ref 78.0–100.0)
MONOS PCT: 9.3 % (ref 3.0–12.0)
Monocytes Absolute: 0.6 10*3/uL (ref 0.1–1.0)
Neutro Abs: 3.9 10*3/uL (ref 1.4–7.7)
Neutrophils Relative %: 58.3 % (ref 43.0–77.0)
Platelets: 245 10*3/uL (ref 150.0–400.0)
RBC: 5.08 Mil/uL (ref 4.22–5.81)
RDW: 14.2 % (ref 11.5–15.5)
WBC: 6.7 10*3/uL (ref 4.0–10.5)

## 2015-05-07 LAB — TSH: TSH: 1.54 u[IU]/mL (ref 0.35–4.50)

## 2015-05-07 LAB — PSA: PSA: 1.25 ng/mL (ref 0.10–4.00)

## 2015-05-20 ENCOUNTER — Encounter: Payer: TRICARE For Life (TFL) | Admitting: Family Medicine

## 2015-05-20 ENCOUNTER — Other Ambulatory Visit: Payer: TRICARE For Life (TFL)

## 2015-05-27 ENCOUNTER — Encounter: Payer: Self-pay | Admitting: Family Medicine

## 2015-05-27 ENCOUNTER — Ambulatory Visit (INDEPENDENT_AMBULATORY_CARE_PROVIDER_SITE_OTHER): Payer: Medicare Other | Admitting: Family Medicine

## 2015-05-27 VITALS — BP 130/84 | HR 87 | Temp 98.4°F | Ht 75.0 in | Wt 260.0 lb

## 2015-05-27 DIAGNOSIS — E785 Hyperlipidemia, unspecified: Secondary | ICD-10-CM

## 2015-05-27 DIAGNOSIS — N528 Other male erectile dysfunction: Secondary | ICD-10-CM | POA: Diagnosis not present

## 2015-05-27 DIAGNOSIS — Z0000 Encounter for general adult medical examination without abnormal findings: Secondary | ICD-10-CM

## 2015-05-27 DIAGNOSIS — N529 Male erectile dysfunction, unspecified: Secondary | ICD-10-CM

## 2015-05-27 DIAGNOSIS — I10 Essential (primary) hypertension: Secondary | ICD-10-CM

## 2015-05-27 DIAGNOSIS — Z23 Encounter for immunization: Secondary | ICD-10-CM

## 2015-05-27 MED ORDER — TADALAFIL 20 MG PO TABS: ORAL_TABLET | ORAL | Status: DC

## 2015-05-27 NOTE — Progress Notes (Signed)
Pre visit review using our clinic review tool, if applicable. No additional management support is needed unless otherwise documented below in the visit note. 

## 2015-05-27 NOTE — Progress Notes (Signed)
Subjective:    Patient ID: Paul Townsend, male    DOB: [birth year], 69 y.o.   MRN: [id number]  HPI Patient here for Medicare wellness exam and medical follow-up  Generally very healthy. He has history of prediabetes, hypertension, erectile dysfunction. He is requesting prescription for Cialis which has worked well for him in the past. No history of shingles vaccine but he would like to pursue that. No history of Prevnar 13. Other immunizations are up-to-date. Blood pressures been stable. Compliant with therapy. He takes Vytorin for hyperlipidemia. No myalgias. No chest pains. No history of CAD or peripheral vascular disease.  Past Medical History  Diagnosis Date  . Arthritis   . Hyperlipidemia   . Hypertension    Past Surgical History  Procedure Laterality Date  . Total knee arthroplasty    . Varicose vein surgery    . Tonsillectomy    . Orif rt femur Right 01/05/2014    reports that he has quit smoking. He has never used smokeless tobacco. He reports that he drinks alcohol. His drug history is not on file. family history includes Arthritis in his father; Cancer in his mother; Heart disease (age of onset: 25) in his father; Hypertension in his father. No Known Allergies  1.  Risk factors based on Past Medical , Social, and Family history reviewed and as indicated above with no changes 2.  Limitations in physical activities None.  No recent falls. 3.  Depression/mood No active depression or anxiety issues 4.  Hearing No defiits 5.  ADLs independent in all. 6.  Cognitive function (orientation to time and place, language, writing, speech,memory) no short or long term memory issues.  Language and judgement intact. 7.  Home Safety no issues 8.  Height, weight, and visual acuity.all stable. 9.  Counseling discussed weight control/loss and regular exercise. 10. Recommendation of preventive services. Continue yearly flu vaccine. Prevnar 13 given. Recommend shingles vaccine 11. Labs  based on risk factors recent labs reviewed with patient 12. Care Plan as above 13. Other Providers Dr Ruthy Dick at [facility]. 14. Written schedule of screening/prevention services given to patient.    Review of Systems  Constitutional: Negative for fever, activity change, appetite change and fatigue.  HENT: Negative for congestion, ear pain and trouble swallowing.   Eyes: Negative for pain and visual disturbance.  Respiratory: Negative for cough, shortness of breath and wheezing.   Cardiovascular: Negative for chest pain and palpitations.  Gastrointestinal: Negative for nausea, vomiting, abdominal pain, diarrhea, constipation, blood in stool, abdominal distention and rectal pain.  Genitourinary: Negative for dysuria, hematuria and testicular pain.  Musculoskeletal: Negative for joint swelling and arthralgias.  Skin: Negative for rash.  Neurological: Negative for dizziness, syncope and headaches.  Hematological: Negative for adenopathy.  Psychiatric/Behavioral: Negative for confusion and dysphoric mood.       Objective:   Physical Exam  Constitutional: He is oriented to person, place, and time. He appears well-developed and well-nourished. No distress.  HENT:  Head: Normocephalic and atraumatic.  Right Ear: External ear normal.  Left Ear: External ear normal.  Mouth/Throat: Oropharynx is clear and moist.  Eyes: Conjunctivae and EOM are normal. Pupils are equal, round, and reactive to light.  Neck: Normal range of motion. Neck supple. No thyromegaly present.  Cardiovascular: Normal rate, regular rhythm and normal heart sounds.   No murmur heard. Pulmonary/Chest: No respiratory distress. He has no wheezes. He has no rales.  Abdominal: Soft. Bowel sounds are normal. He exhibits no distension and no  mass. There is no tenderness. There is no rebound and no guarding.  Musculoskeletal: He exhibits no edema.  Lymphadenopathy:    He has no cervical adenopathy.  Neurological:  He is alert and oriented to person, place, and time. He displays normal reflexes. No cranial nerve deficit.  Skin: No rash noted.  Psychiatric: He has a normal mood and affect.          Assessment & Plan:  #1 Medicare wellness exam. Prevnar 13 given. Check on coverage for shingles vaccine. #2 erectile dysfunction. Cialis 20 mg every other day as needed #3 hypertension stable and at goal #4 hyperlipidemia. Continue Vytorin. Recent lipids improved compared to last year.

## 2015-05-27 NOTE — Patient Instructions (Signed)
Continue yearly flu vaccine Continue regular exercise habits

## 2015-06-02 DIAGNOSIS — Z471 Aftercare following joint replacement surgery: Secondary | ICD-10-CM | POA: Diagnosis not present

## 2015-06-02 DIAGNOSIS — Z96651 Presence of right artificial knee joint: Secondary | ICD-10-CM | POA: Diagnosis not present

## 2015-06-02 DIAGNOSIS — S72461K Displaced supracondylar fracture with intracondylar extension of lower end of right femur, subsequent encounter for closed fracture with nonunion: Secondary | ICD-10-CM | POA: Diagnosis not present

## 2015-06-02 DIAGNOSIS — T84048D Periprosthetic fracture around other internal prosthetic joint, subsequent encounter: Secondary | ICD-10-CM | POA: Diagnosis not present

## 2015-06-29 DIAGNOSIS — Z961 Presence of intraocular lens: Secondary | ICD-10-CM | POA: Diagnosis not present

## 2015-06-29 DIAGNOSIS — H33303 Unspecified retinal break, bilateral: Secondary | ICD-10-CM | POA: Diagnosis not present

## 2015-06-29 DIAGNOSIS — H1851 Endothelial corneal dystrophy: Secondary | ICD-10-CM | POA: Diagnosis not present

## 2015-06-29 DIAGNOSIS — H5211 Myopia, right eye: Secondary | ICD-10-CM | POA: Diagnosis not present

## 2015-07-16 DIAGNOSIS — H35373 Puckering of macula, bilateral: Secondary | ICD-10-CM | POA: Diagnosis not present

## 2015-07-16 DIAGNOSIS — H43813 Vitreous degeneration, bilateral: Secondary | ICD-10-CM | POA: Diagnosis not present

## 2015-07-20 ENCOUNTER — Other Ambulatory Visit: Payer: Self-pay | Admitting: Family Medicine

## 2015-08-19 ENCOUNTER — Other Ambulatory Visit: Payer: Self-pay | Admitting: Family Medicine

## 2015-08-25 DIAGNOSIS — Z96651 Presence of right artificial knee joint: Secondary | ICD-10-CM | POA: Diagnosis not present

## 2015-08-25 DIAGNOSIS — M8580 Other specified disorders of bone density and structure, unspecified site: Secondary | ICD-10-CM | POA: Diagnosis not present

## 2015-08-25 DIAGNOSIS — Z96641 Presence of right artificial hip joint: Secondary | ICD-10-CM | POA: Diagnosis not present

## 2015-08-25 DIAGNOSIS — M978XXD Periprosthetic fracture around other internal prosthetic joint, subsequent encounter: Secondary | ICD-10-CM | POA: Diagnosis not present

## 2015-09-18 ENCOUNTER — Ambulatory Visit (INDEPENDENT_AMBULATORY_CARE_PROVIDER_SITE_OTHER): Payer: Medicare Other | Admitting: Family Medicine

## 2015-09-18 ENCOUNTER — Encounter: Payer: Self-pay | Admitting: Family Medicine

## 2015-09-18 VITALS — BP 140/80 | HR 93 | Temp 98.2°F | Resp 16 | Ht 75.0 in | Wt 267.0 lb

## 2015-09-18 DIAGNOSIS — J309 Allergic rhinitis, unspecified: Secondary | ICD-10-CM | POA: Diagnosis not present

## 2015-09-18 DIAGNOSIS — Z23 Encounter for immunization: Secondary | ICD-10-CM

## 2015-09-18 NOTE — Progress Notes (Signed)
   Subjective:    Patient ID: Paul Townsend, male    DOB: 03-Jun-1946, 69 y.o.   MRN: [id number]  HPI Patient seen with rhinitis symptoms. Last week he was visiting family in [place]. They have a cat which he thinks may trigger. He's had significant nasal congestion and postnasal drip symptoms. He took some over-the-counter Flonase and Sudafed which helped somewhat. Still has some congestive symptoms and occasionally itchy eyes. No fever. No puslike discharge nasally. No cough.  Needs flu vaccine and also requesting shingles vaccine. No contraindications.  Past Medical History  Diagnosis Date  . Arthritis   . Hyperlipidemia   . Hypertension    Past Surgical History  Procedure Laterality Date  . Total knee arthroplasty    . Varicose vein surgery    . Tonsillectomy    . Orif rt femur Right 01/05/2014    reports that he has quit smoking. He has never used smokeless tobacco. He reports that he drinks alcohol. His drug history is not on file. family history includes Arthritis in his father; Cancer in his mother; Heart disease (age of onset: 74) in his father; Hypertension in his father. No Known Allergies    Review of Systems  Constitutional: Negative for fever, chills and appetite change.  HENT: Positive for congestion and sinus pressure.   Respiratory: Negative for cough.        Objective:   Physical Exam  Constitutional: He appears well-developed and well-nourished.  HENT:  Right Ear: External ear normal.  Left Ear: External ear normal.  Nose: Nose normal.  Mouth/Throat: Oropharynx is clear and moist.  Neck: Neck supple.  Cardiovascular: Normal rate and regular rhythm.   Pulmonary/Chest: Effort normal and breath sounds normal. No respiratory distress. He has no wheezes. He has no rales.  Lymphadenopathy:    He has no cervical adenopathy.          Assessment & Plan:  Allergic rhinitis. Continue Flonase. Avoid regular use of Sudafed. Try over-the-counter Allegra  or Zyrtec. Consider Astelin nasal if not relieved with the above. Flu vaccine and shingles vaccine given-he has no contraindications

## 2015-09-18 NOTE — Patient Instructions (Signed)
Try OTC Allegra or Zyrtec one daily.

## 2015-09-18 NOTE — Progress Notes (Signed)
Pre visit review using our clinic review tool, if applicable. No additional management support is needed unless otherwise documented below in the visit note. 

## 2015-09-29 ENCOUNTER — Other Ambulatory Visit: Payer: Self-pay | Admitting: Family Medicine

## 2015-09-29 MED ORDER — EZETIMIBE-SIMVASTATIN 10-20 MG PO TABS: ORAL_TABLET | ORAL | Status: DC

## 2015-09-29 MED ORDER — HYDROCHLOROTHIAZIDE 12.5 MG PO CAPS: 12.5000 mg | ORAL_CAPSULE | Freq: Every day | ORAL | Status: DC

## 2015-10-16 ENCOUNTER — Other Ambulatory Visit: Payer: Self-pay | Admitting: Family Medicine

## 2015-10-16 MED ORDER — HYDROCHLOROTHIAZIDE 12.5 MG PO CAPS: 12.5000 mg | ORAL_CAPSULE | Freq: Every day | ORAL | Status: DC

## 2015-10-16 MED ORDER — EZETIMIBE-SIMVASTATIN 10-20 MG PO TABS: 1.0000 | ORAL_TABLET | Freq: Every day | ORAL | Status: DC

## 2015-12-09 ENCOUNTER — Encounter: Payer: Self-pay | Admitting: Family Medicine

## 2015-12-09 ENCOUNTER — Ambulatory Visit (INDEPENDENT_AMBULATORY_CARE_PROVIDER_SITE_OTHER): Payer: Medicare Other | Admitting: Family Medicine

## 2015-12-09 ENCOUNTER — Ambulatory Visit (INDEPENDENT_AMBULATORY_CARE_PROVIDER_SITE_OTHER)
Admission: RE | Admit: 2015-12-09 | Discharge: 2015-12-09 | Disposition: A | Discharge status: Home / Self Care | Payer: Medicare Other | Source: Ambulatory Visit | Attending: Family Medicine | Admitting: Family Medicine

## 2015-12-09 VITALS — BP 120/76 | HR 92 | Temp 98.4°F | Ht 75.0 in | Wt 261.9 lb

## 2015-12-09 DIAGNOSIS — M25531 Pain in right wrist: Secondary | ICD-10-CM

## 2015-12-09 DIAGNOSIS — M19031 Primary osteoarthritis, right wrist: Secondary | ICD-10-CM | POA: Diagnosis not present

## 2015-12-09 LAB — SEDIMENTATION RATE: SED RATE: 5 mm/h (ref 0–22)

## 2015-12-09 NOTE — Progress Notes (Signed)
   Subjective:    Patient ID: Paul Townsend, male    DOB: 08/30/1946, 70 y.o.   MRN: [id number]  HPI   Patient seen for acute visit with right wrist pain which has actually gone on for about 6 months. He is right-hand dominant. He denies any injury. He's had some intermittent swelling but has never noticed any redness or warmth. He has pain mostly along the dorsal distal aspect of the radius. He has not complained of any other general arthralgias and denies any hand pain. He has some pain with gripping and extension and flexion of the wrist. No history of gout or pseudogout. No family history of inflammatory arthritis. He has not taken any anti-inflammatories.  Past Medical History  Diagnosis Date  . Arthritis   . Hyperlipidemia   . Hypertension    Past Surgical History  Procedure Laterality Date  . Total knee arthroplasty    . Varicose vein surgery    . Tonsillectomy    . Orif rt femur Right 01/05/2014    reports that he has quit smoking. He has never used smokeless tobacco. He reports that he drinks alcohol. His drug history is not on file. family history includes Arthritis in his father; Cancer in his mother; Heart disease (age of onset: 27) in his father; Hypertension in his father. No Known Allergies    Review of Systems  Constitutional: Negative for fever and chills.  Respiratory: Negative for shortness of breath.   Musculoskeletal: Positive for arthralgias.  Skin: Negative for rash.  Hematological: Negative for adenopathy.       Objective:   Physical Exam  Constitutional: He appears well-developed and well-nourished.  Cardiovascular: Normal rate and regular rhythm.   Pulmonary/Chest: Effort normal and breath sounds normal. No respiratory distress. He has no wheezes. He has no rales.  Musculoskeletal:  Right wrist reveals small effusion. No warmth. No erythema. He has some minimal tenderness over the distal aspect of the radius. Full range of motion.         Assessment & Plan:   right wrist pain. He definitely has a small effusion. We cannot appreciate any warmth or erythema. He doesn't report any recent injury. Check labs with CCP antibody, rheumatoid factor, sedimentation rate , ANA. Obtain x-rays.

## 2015-12-09 NOTE — Progress Notes (Signed)
Pre visit review using our clinic review tool, if applicable. No additional management support is needed unless otherwise documented below in the visit note. 

## 2015-12-10 DIAGNOSIS — H43813 Vitreous degeneration, bilateral: Secondary | ICD-10-CM | POA: Diagnosis not present

## 2015-12-10 DIAGNOSIS — H35373 Puckering of macula, bilateral: Secondary | ICD-10-CM | POA: Diagnosis not present

## 2015-12-10 LAB — CYCLIC CITRUL PEPTIDE ANTIBODY, IGG: Cyclic Citrullin Peptide Ab: <16 Units

## 2015-12-10 LAB — RHEUMATOID FACTOR

## 2015-12-10 LAB — ANA: ANA: NEGATIVE

## 2015-12-14 DIAGNOSIS — M545 Low back pain: Secondary | ICD-10-CM | POA: Diagnosis not present

## 2015-12-14 DIAGNOSIS — M25641 Stiffness of right hand, not elsewhere classified: Secondary | ICD-10-CM | POA: Diagnosis not present

## 2015-12-14 DIAGNOSIS — M9903 Segmental and somatic dysfunction of lumbar region: Secondary | ICD-10-CM | POA: Diagnosis not present

## 2015-12-14 DIAGNOSIS — M9901 Segmental and somatic dysfunction of cervical region: Secondary | ICD-10-CM | POA: Diagnosis not present

## 2015-12-16 DIAGNOSIS — M545 Low back pain: Secondary | ICD-10-CM | POA: Diagnosis not present

## 2015-12-16 DIAGNOSIS — M9904 Segmental and somatic dysfunction of sacral region: Secondary | ICD-10-CM | POA: Diagnosis not present

## 2015-12-16 DIAGNOSIS — M25641 Stiffness of right hand, not elsewhere classified: Secondary | ICD-10-CM | POA: Diagnosis not present

## 2015-12-16 DIAGNOSIS — M9902 Segmental and somatic dysfunction of thoracic region: Secondary | ICD-10-CM | POA: Diagnosis not present

## 2015-12-16 DIAGNOSIS — M9901 Segmental and somatic dysfunction of cervical region: Secondary | ICD-10-CM | POA: Diagnosis not present

## 2015-12-16 DIAGNOSIS — M9903 Segmental and somatic dysfunction of lumbar region: Secondary | ICD-10-CM | POA: Diagnosis not present

## 2015-12-17 DIAGNOSIS — M9903 Segmental and somatic dysfunction of lumbar region: Secondary | ICD-10-CM | POA: Diagnosis not present

## 2015-12-17 DIAGNOSIS — M9901 Segmental and somatic dysfunction of cervical region: Secondary | ICD-10-CM | POA: Diagnosis not present

## 2015-12-17 DIAGNOSIS — M545 Low back pain: Secondary | ICD-10-CM | POA: Diagnosis not present

## 2015-12-17 DIAGNOSIS — M9902 Segmental and somatic dysfunction of thoracic region: Secondary | ICD-10-CM | POA: Diagnosis not present

## 2015-12-17 DIAGNOSIS — M25641 Stiffness of right hand, not elsewhere classified: Secondary | ICD-10-CM | POA: Diagnosis not present

## 2015-12-17 DIAGNOSIS — M9904 Segmental and somatic dysfunction of sacral region: Secondary | ICD-10-CM | POA: Diagnosis not present

## 2015-12-21 DIAGNOSIS — M9902 Segmental and somatic dysfunction of thoracic region: Secondary | ICD-10-CM | POA: Diagnosis not present

## 2015-12-21 DIAGNOSIS — M9901 Segmental and somatic dysfunction of cervical region: Secondary | ICD-10-CM | POA: Diagnosis not present

## 2015-12-21 DIAGNOSIS — M25641 Stiffness of right hand, not elsewhere classified: Secondary | ICD-10-CM | POA: Diagnosis not present

## 2015-12-21 DIAGNOSIS — M545 Low back pain: Secondary | ICD-10-CM | POA: Diagnosis not present

## 2015-12-21 DIAGNOSIS — M9904 Segmental and somatic dysfunction of sacral region: Secondary | ICD-10-CM | POA: Diagnosis not present

## 2015-12-21 DIAGNOSIS — M9903 Segmental and somatic dysfunction of lumbar region: Secondary | ICD-10-CM | POA: Diagnosis not present

## 2016-01-06 DIAGNOSIS — M545 Low back pain: Secondary | ICD-10-CM | POA: Diagnosis not present

## 2016-01-06 DIAGNOSIS — M9902 Segmental and somatic dysfunction of thoracic region: Secondary | ICD-10-CM | POA: Diagnosis not present

## 2016-01-06 DIAGNOSIS — M9903 Segmental and somatic dysfunction of lumbar region: Secondary | ICD-10-CM | POA: Diagnosis not present

## 2016-01-06 DIAGNOSIS — M9904 Segmental and somatic dysfunction of sacral region: Secondary | ICD-10-CM | POA: Diagnosis not present

## 2016-01-06 DIAGNOSIS — M25641 Stiffness of right hand, not elsewhere classified: Secondary | ICD-10-CM | POA: Diagnosis not present

## 2016-01-06 DIAGNOSIS — M9901 Segmental and somatic dysfunction of cervical region: Secondary | ICD-10-CM | POA: Diagnosis not present

## 2016-02-23 DIAGNOSIS — Z96653 Presence of artificial knee joint, bilateral: Secondary | ICD-10-CM | POA: Diagnosis not present

## 2016-02-23 DIAGNOSIS — Z96659 Presence of unspecified artificial knee joint: Secondary | ICD-10-CM | POA: Diagnosis not present

## 2016-02-23 DIAGNOSIS — M978XXD Periprosthetic fracture around other internal prosthetic joint, subsequent encounter: Secondary | ICD-10-CM | POA: Diagnosis not present

## 2016-02-23 DIAGNOSIS — Z471 Aftercare following joint replacement surgery: Secondary | ICD-10-CM | POA: Diagnosis not present

## 2016-02-24 DIAGNOSIS — Z96659 Presence of unspecified artificial knee joint: Secondary | ICD-10-CM | POA: Insufficient documentation

## 2016-05-17 ENCOUNTER — Telehealth: Payer: Self-pay | Admitting: Family Medicine

## 2016-05-17 NOTE — Telephone Encounter (Signed)
Pt want a RX for Cipro going on a mission trip and need this for his trip.Please give him a call.

## 2016-05-17 NOTE — Telephone Encounter (Signed)
This pt was last seen in February of this year. Should we schedule him a follow up to discuss or okay for medication?

## 2016-05-17 NOTE — Telephone Encounter (Signed)
Ok to refill Cipro 500 mg po bid for 3 days prn travelers diarrhea #12 If he has questions about other immunizations, offer follow up.

## 2016-05-18 MED ORDER — CIPROFLOXACIN HCL 500 MG PO TABS: ORAL_TABLET | ORAL | Status: DC

## 2016-05-18 NOTE — Telephone Encounter (Signed)
Spoke with pt, medication sent in the pharmacy. FYI he is going to [place].

## 2016-05-20 ENCOUNTER — Ambulatory Visit (INDEPENDENT_AMBULATORY_CARE_PROVIDER_SITE_OTHER): Payer: Medicare Other | Admitting: Internal Medicine

## 2016-05-20 DIAGNOSIS — Z7189 Other specified counseling: Secondary | ICD-10-CM | POA: Diagnosis not present

## 2016-05-20 DIAGNOSIS — Z2089 Contact with and (suspected) exposure to other communicable diseases: Secondary | ICD-10-CM | POA: Diagnosis not present

## 2016-05-20 DIAGNOSIS — Z9189 Other specified personal risk factors, not elsewhere classified: Secondary | ICD-10-CM | POA: Diagnosis not present

## 2016-05-20 DIAGNOSIS — Z7184 Encounter for health counseling related to travel: Secondary | ICD-10-CM | POA: Insufficient documentation

## 2016-05-20 DIAGNOSIS — Z23 Encounter for immunization: Secondary | ICD-10-CM | POA: Diagnosis not present

## 2016-05-20 MED ORDER — ATOVAQUONE-PROGUANIL HCL 250-100 MG PO TABS: 1.0000 | ORAL_TABLET | Freq: Every day | ORAL | Status: DC

## 2016-05-20 NOTE — Patient Instructions (Signed)
[facility]                [address], [place]                   [place], [place] [address]                      Phone: [phone]                        Fax: [phone]   Planned departure date: May 26, 2016          Planned return date: 10 days Countries of travel: [place]   Guidelines for the Prevention & Treatment of Traveler's Diarrhea  Prevention: "Boil it, Peel it, Lacinda Axon it, or Forget it"   the fewer chances -> lower risk: try to stick to food & water precautions as much as possible"   If it's "piping hot"; it is probably okay, if not, it may not be   Treatment   1) You should always take care to drink lots of fluids in order to avoid dehydration   2) You should bring medications with you in case you come down with a case of diarrhea   3) OTC = bring pepto-bismol - can take with initial abdominal symptoms;                    Imodium - can help slow down your intestinal tract, can help relief cramps                    and diarrhea, can take if no bloody diarrhea  Use ciprofloxacin if needed for traveler's diarrhea  Guidelines for the Prevention of Malaria  Avoidance:  -fewer mosquito bites = lower risk. Mosquitos can bite at night as well as daytime  -cover up (long sleeve clothing), mosquito nets, screens  -Insect repellent for your skin ( DEET containing lotion > 20%): for clothes ( permethrin spray)   On July 18 start malarone, daily dose starting 1-2 days before entering endemic area, ending 7 days after leaving area for malaria prevention.   Immunizations received today: Hepatitis A series and Typhoid (parenteral)  Future immunizations, if indicated none indicated   Prior to travel:  1) Be sure to pick up appropriate prescriptions, including medicine you take daily. Do not expect to be able to fill your prescriptions abroad.  2) Strongly consider obtaining traveler's insurance, including emergency  evacuation insurance. Most plans in the [place] do not cover participants abroad. (see below for resources)  3) Register at the appropriate [place] embassy or consulate with travel dates so they are aware of your presence in-country and for helpful advice during travel using the [employer] (STEP, GreenNylon.com.cy).  4) Leave contact information with a relative or friend.  5) Keep a Research officer, political party, credit cards in case they become lost or stolen  6) Inform your credit card company that you will be travelling abroad   During travel:  1) If you become ill and need medical advice, the [place] [employer]	[place] of the country you are traveling in general provides a list of [place] speaking doctors.  We are also available on MyChart for remote consultation if you register prior to travel. 2) Avoid motorcycles or scooters when at all possible. Traffic laws in many countries are lax and accidents occur frequently.  3)  Do not take any unnecessary risks that you wouldn't do at home.   Resources:  -Country specific information: BlindResource.ca or GreenNylon.com.cy  -Press photographer (DEET, mosquito nets): [employer], [employer] store, [employer], [place] insurance options: gatewayplans.com; http://clayton-rivera.info/; travelguard.com or Good [employer], gninsurance.com or info@gninsurance .com, 670-162-3205.   Post Travel:  If you return from your trip ill, call your primary care doctor or our travel clinic @ [phone].   Enjoy your trip and know that with proper pre-travel preparation, most people have an enjoyable and uninterrupted trip!

## 2016-05-20 NOTE — Progress Notes (Signed)
Subjective:   Uriyah Etue is a 70 y.o. male who presents to the Infectious Disease clinic for travel consultation. Planned departure date: May 26, 2016          Planned return date: 10 days Countries of travel: [place] Areas in country: urban   Accommodations: hotel Purpose of travel: Radiographer, therapeutic Prior travel out of [place]: yes     Objective:   Medications: reviewed    Assessment:    No contraindications to travel. none     Plan:    Issues discussed: altitude illness, freshwater swimming, future shots, insect-borne illnesses, Japanese encephalitis, malaria, motion sickness, MVA safety, rabies, safe food/water, traveler's diarrhea, website/handouts for more information, what to do if ill upon return and what to do if ill while there. Immunizations recommended: Hepatitis A series and Typhoid (parenteral). Malaria prophylaxis: malarone, daily dose starting 1-2 days before entering endemic area, ending 7 days after leaving area Traveler's diarrhea prophylaxis: azithromycin. Total duration of visit: 1 Hour. Total time spent on education, counseling, coordination of care: 30 Minutes.

## 2016-05-23 DIAGNOSIS — Z299 Encounter for prophylactic measures, unspecified: Secondary | ICD-10-CM | POA: Insufficient documentation

## 2016-05-23 DIAGNOSIS — Z2089 Contact with and (suspected) exposure to other communicable diseases: Secondary | ICD-10-CM | POA: Insufficient documentation

## 2016-05-23 DIAGNOSIS — Z23 Encounter for immunization: Secondary | ICD-10-CM | POA: Insufficient documentation

## 2016-05-23 DIAGNOSIS — Z9189 Other specified personal risk factors, not elsewhere classified: Secondary | ICD-10-CM | POA: Insufficient documentation

## 2016-05-23 DIAGNOSIS — Z7189 Other specified counseling: Secondary | ICD-10-CM | POA: Insufficient documentation

## 2016-05-24 MED ORDER — TYPHOID VI POLYSACCHARIDE VACC 25 MCG/0.5ML IM SOLN: 0.5000 mL | Freq: Once | INTRAMUSCULAR | Status: DC

## 2016-06-14 NOTE — Addendum Note (Signed)
Addended by: Landis Gandy on: 06/14/2016 03:41 PM   Modules accepted: Orders

## 2016-06-16 DIAGNOSIS — H10413 Chronic giant papillary conjunctivitis, bilateral: Secondary | ICD-10-CM | POA: Diagnosis not present

## 2016-07-12 DIAGNOSIS — H35372 Puckering of macula, left eye: Secondary | ICD-10-CM | POA: Diagnosis not present

## 2016-07-12 DIAGNOSIS — H5211 Myopia, right eye: Secondary | ICD-10-CM | POA: Diagnosis not present

## 2016-09-25 ENCOUNTER — Other Ambulatory Visit: Payer: Self-pay | Admitting: Family Medicine

## 2016-10-10 ENCOUNTER — Other Ambulatory Visit (INDEPENDENT_AMBULATORY_CARE_PROVIDER_SITE_OTHER): Payer: Medicare Other

## 2016-10-10 DIAGNOSIS — I10 Essential (primary) hypertension: Secondary | ICD-10-CM

## 2016-10-10 DIAGNOSIS — E785 Hyperlipidemia, unspecified: Secondary | ICD-10-CM | POA: Diagnosis not present

## 2016-10-10 DIAGNOSIS — Z125 Encounter for screening for malignant neoplasm of prostate: Secondary | ICD-10-CM | POA: Diagnosis not present

## 2016-10-10 LAB — CBC WITH DIFFERENTIAL/PLATELET
Basophils Absolute: 0.1 10*3/uL (ref 0.0–0.1)
Basophils Relative: 0.7 % (ref 0.0–3.0)
EOS ABS: 0.7 10*3/uL (ref 0.0–0.7)
EOS PCT: 9.3 % — AB (ref 0.0–5.0)
HEMATOCRIT: 47.9 % (ref 39.0–52.0)
HEMOGLOBIN: 16 g/dL (ref 13.0–17.0)
LYMPHS PCT: 22.2 % (ref 12.0–46.0)
Lymphs Abs: 1.8 10*3/uL (ref 0.7–4.0)
MCHC: 33.5 g/dL (ref 30.0–36.0)
MCV: 94.3 fl (ref 78.0–100.0)
MONO ABS: 0.8 10*3/uL (ref 0.1–1.0)
Monocytes Relative: 10.1 % (ref 3.0–12.0)
Neutro Abs: 4.6 10*3/uL (ref 1.4–7.7)
Neutrophils Relative %: 57.7 % (ref 43.0–77.0)
Platelets: 225 10*3/uL (ref 150.0–400.0)
RBC: 5.08 Mil/uL (ref 4.22–5.81)
RDW: 13.7 % (ref 11.5–15.5)
WBC: 8 10*3/uL (ref 4.0–10.5)

## 2016-10-10 LAB — LIPID PANEL
CHOL/HDL RATIO: 3
Cholesterol: 157 mg/dL (ref 0–200)
HDL: 51 mg/dL (ref >39.00)
LDL CALC: 69 mg/dL (ref 0–99)
NONHDL: 106.44
Triglycerides: 185 mg/dL — ABNORMAL HIGH (ref 0.0–149.0)
VLDL: 37 mg/dL (ref 0.0–40.0)

## 2016-10-10 LAB — HEPATIC FUNCTION PANEL
ALT: 18 U/L (ref 0–53)
AST: 16 U/L (ref 0–37)
Albumin: 4.1 g/dL (ref 3.5–5.2)
Alkaline Phosphatase: 54 U/L (ref 39–117)
BILIRUBIN DIRECT: 0.2 mg/dL (ref 0.0–0.3)
BILIRUBIN TOTAL: 1.3 mg/dL — AB (ref 0.2–1.2)
TOTAL PROTEIN: 6.6 g/dL (ref 6.0–8.3)

## 2016-10-10 LAB — BASIC METABOLIC PANEL
BUN: 12 mg/dL (ref 6–23)
CHLORIDE: 106 meq/L (ref 96–112)
CO2: 26 mEq/L (ref 19–32)
Calcium: 9.5 mg/dL (ref 8.4–10.5)
Creatinine, Ser: 0.92 mg/dL (ref 0.40–1.50)
GFR: 86.08 mL/min (ref >60.00)
Glucose, Bld: 122 mg/dL — ABNORMAL HIGH (ref 70–99)
POTASSIUM: 4.2 meq/L (ref 3.5–5.1)
Sodium: 142 mEq/L (ref 135–145)

## 2016-10-10 LAB — TSH: TSH: 1.62 u[IU]/mL (ref 0.35–4.50)

## 2016-10-10 LAB — PSA: PSA: 2.29 ng/mL (ref 0.10–4.00)

## 2016-10-17 ENCOUNTER — Encounter: Payer: Self-pay | Admitting: Family Medicine

## 2016-10-17 ENCOUNTER — Ambulatory Visit (INDEPENDENT_AMBULATORY_CARE_PROVIDER_SITE_OTHER): Payer: Medicare Other | Admitting: Family Medicine

## 2016-10-17 VITALS — BP 142/88 | HR 97 | Temp 98.3°F | Ht 75.0 in | Wt 261.0 lb

## 2016-10-17 DIAGNOSIS — Z23 Encounter for immunization: Secondary | ICD-10-CM

## 2016-10-17 DIAGNOSIS — N529 Male erectile dysfunction, unspecified: Secondary | ICD-10-CM

## 2016-10-17 DIAGNOSIS — I10 Essential (primary) hypertension: Secondary | ICD-10-CM

## 2016-10-17 DIAGNOSIS — R7303 Prediabetes: Secondary | ICD-10-CM

## 2016-10-17 DIAGNOSIS — E785 Hyperlipidemia, unspecified: Secondary | ICD-10-CM

## 2016-10-17 DIAGNOSIS — R972 Elevated prostate specific antigen [PSA]: Secondary | ICD-10-CM

## 2016-10-17 DIAGNOSIS — Z0000 Encounter for general adult medical examination without abnormal findings: Secondary | ICD-10-CM

## 2016-10-17 DIAGNOSIS — M19031 Primary osteoarthritis, right wrist: Secondary | ICD-10-CM

## 2016-10-17 DIAGNOSIS — M150 Primary generalized (osteo)arthritis: Secondary | ICD-10-CM

## 2016-10-17 DIAGNOSIS — M159 Polyosteoarthritis, unspecified: Secondary | ICD-10-CM | POA: Insufficient documentation

## 2016-10-17 MED ORDER — DICLOFENAC SODIUM 1 % TD GEL: 2.0000 g | Freq: Three times a day (TID) | TRANSDERMAL | 5 refills | Status: DC

## 2016-10-17 MED ORDER — SILDENAFIL CITRATE 100 MG PO TABS: 100.0000 mg | ORAL_TABLET | Freq: Every day | ORAL | 11 refills | Status: DC | PRN

## 2016-10-17 NOTE — Progress Notes (Signed)
Pre visit review using our clinic review tool, if applicable. No additional management support is needed unless otherwise documented below in the visit note. 

## 2016-10-17 NOTE — Progress Notes (Signed)
Subjective:     Patient ID: Paul Townsend, male   DOB: Apr 30, 1946, 70 y.o.   MRN: [id number]  HPI Patient seen for Medicare subsequent annual wellness visit and medical follow-up. His chronic problems include history of osteoarthritis, hyperlipidemia, hypertension, erectile dysfunction. Has recently been using Cialis but feels like Viagra worked better regarding his ED symptoms. Libido is stable.  Hypertension treated with HCTZ. Not monitoring blood pressures much at home. Borderline elevations in the past. He takes Vytorin for hyperlipidemia. No significant myalgias.  He has had some progressive right wrist pain and we obtained x-rays recently which showed severe degenerative changes. He's having increasing limited range of motion secondary to stiffness and also pain.  Patient has worked for years with [employer]. This followed 26 year career in the [employer]. He has basically fully retired at this point. 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 Nonsmoker. Drinks generally about 2 glasses of wine per evening. Poor dietary compliance. Frequently eats desserts with ice cream and cookies  Past Medical History:  Diagnosis Date  . Arthritis   . Hyperlipidemia   . Hypertension    Past Surgical History:  Procedure Laterality Date  . ORIF Rt femur Right 01/05/2014  . TONSILLECTOMY    . TOTAL KNEE ARTHROPLASTY    . VARICOSE VEIN SURGERY      reports that he has quit smoking. He has never used smokeless tobacco. He reports that he drinks alcohol. His drug history is not on file. family history includes Arthritis in his father; Cancer in his mother; Heart disease (age of onset: 54) in his father; Hypertension in his father. No Known Allergies   Review of Systems  Constitutional: Negative for activity change, appetite change, fatigue and fever.  HENT: Negative for congestion, ear pain and trouble swallowing.   Eyes: Negative  for pain and visual disturbance.  Respiratory: Negative for cough, shortness of breath and wheezing.   Cardiovascular: Negative for chest pain and palpitations.  Gastrointestinal: Negative for abdominal distention, abdominal pain, blood in stool, constipation, diarrhea, nausea, rectal pain and vomiting.  Genitourinary: Negative for dysuria, hematuria and testicular pain.  Musculoskeletal: Positive for arthralgias. Negative for joint swelling.  Skin: Negative for rash.  Neurological: Negative for dizziness, syncope and headaches.  Hematological: Negative for adenopathy.  Psychiatric/Behavioral: Negative for confusion and dysphoric mood.       Objective:   Physical Exam  Constitutional: He is oriented to person, place, and time. He appears well-developed and well-nourished. No distress.  HENT:  Head: Normocephalic and atraumatic.  Right Ear: External ear normal.  Left Ear: External ear normal.  Mouth/Throat: Oropharynx is clear and moist.  Eyes: Conjunctivae and EOM are normal. Pupils are equal, round, and reactive to light.  Neck: Normal range of motion. Neck supple. No thyromegaly present.  Cardiovascular: Normal rate, regular rhythm and normal heart sounds.   No murmur heard. Pulmonary/Chest: No respiratory distress. He has no wheezes. He has no rales.  Abdominal: Soft. Bowel sounds are normal. He exhibits no distension and no mass. There is no tenderness. There is no rebound and no guarding.  Genitourinary:  Genitourinary Comments: Prostate minimally enlarged but no nodules. No asymmetry. No rectal mass noted.  Musculoskeletal: He exhibits no edema.  Lymphadenopathy:    He has no cervical adenopathy.  Neurological: He is alert and oriented to person, place, and time. He displays normal reflexes. No cranial nerve deficit.  Skin: No rash noted.  Psychiatric: He has a normal mood and affect.       Assessment:     #1 Medicare subsequent annual wellness visit. Patient needs flu  vaccine. Other immunizations up-to-date  #2 elevated blood pressure. Borderline elevated.   #3 history of dyslipidemia  #4 erectile dysfunction  #5 increased PSA velocity. His level was normal but is increased about 1 from last year  # 6 Osteoarthritis involving multiple joints. Previous knee replacement and progressive right wrist pain and stiffness  #7 prediabetes with recent fasting glucose 122       Plan:     -Strongly encouraged to lose some weight -Follow-up within 3 months and reassess blood pressure along with hemoglobin A1c and fasting glucose at that point -Diclofenac topical gel to use 3 times daily as needed for right wrist pain -Handout given on dietary approaches to managing high triglycerides-reduce ETOH and sugar intake. -Continue current medications with exception of patient requesting switch from Cialis back to Viagra which worked better for him -Flu vaccine given  Eulas Post MD [facility]

## 2016-10-17 NOTE — Patient Instructions (Addendum)
Food Choices to Lower Your Triglycerides Triglycerides are a type of fat in your blood. High levels of triglycerides can increase the risk of heart disease and stroke. If your triglyceride levels are high, the foods you eat and your eating habits are very important. Choosing the right foods can help lower your triglycerides. What general guidelines do I need to follow?  Lose weight if you are overweight.  Limit or avoid alcohol.  Fill one half of your plate with vegetables and green salads.  Limit fruit to two servings a day. Choose fruit instead of juice.  Make one fourth of your plate whole grains. Look for the word "whole" as the first word in the ingredient list.  Fill one fourth of your plate with lean protein foods.  Enjoy fatty fish (such as salmon, mackerel, sardines, and tuna) three times a week.  Choose healthy fats.  Limit foods high in starch and sugar.  Eat more home-cooked food and less restaurant, buffet, and fast food.  Limit fried foods.  Cook foods using methods other than frying.  Limit saturated fats.  Check ingredient lists to avoid foods with partially hydrogenated oils (trans fats) in them. What foods can I eat? Grains  Whole grains, such as whole wheat or whole grain breads, crackers, cereals, and pasta. Unsweetened oatmeal, bulgur, barley, quinoa, or brown rice. Corn or whole wheat flour tortillas. Vegetables  Fresh or frozen vegetables (raw, steamed, roasted, or grilled). Green salads. Fruits  All fresh, canned (in natural juice), or frozen fruits. Meat and Other Protein Products  Ground beef (85% or leaner), grass-fed beef, or beef trimmed of fat. Skinless chicken or turkey. Ground chicken or turkey. Pork trimmed of fat. All fish and seafood. Eggs. Dried beans, peas, or lentils. Unsalted nuts or seeds. Unsalted canned or dry beans. Dairy  Low-fat dairy products, such as skim or 1% milk, 2% or reduced-fat cheeses, low-fat ricotta or cottage cheese,  or plain low-fat yogurt. Fats and Oils  Tub margarines without trans fats. Light or reduced-fat mayonnaise and salad dressings. Avocado. Safflower, olive, or canola oils. Natural peanut or almond butter. The items listed above may not be a complete list of recommended foods or beverages. Contact your dietitian for more options.  What foods are not recommended? Grains  White bread. White pasta. White rice. Cornbread. Bagels, pastries, and croissants. Crackers that contain trans fat. Vegetables  White potatoes. Corn. Creamed or fried vegetables. Vegetables in a cheese sauce. Fruits  Dried fruits. Canned fruit in light or heavy syrup. Fruit juice. Meat and Other Protein Products  Fatty cuts of meat. Ribs, chicken wings, bacon, sausage, bologna, salami, chitterlings, fatback, hot dogs, bratwurst, and packaged luncheon meats. Dairy  Whole or 2% milk, cream, half-and-half, and cream cheese. Whole-fat or sweetened yogurt. Full-fat cheeses. Nondairy creamers and whipped toppings. Processed cheese, cheese spreads, or cheese curds. Sweets and Desserts  Corn syrup, sugars, honey, and molasses. Candy. Jam and jelly. Syrup. Sweetened cereals. Cookies, pies, cakes, donuts, muffins, and ice cream. Fats and Oils  Butter, stick margarine, lard, shortening, ghee, or bacon fat. Coconut, palm kernel, or palm oils. Beverages  Alcohol. Sweetened drinks (such as sodas, lemonade, and fruit drinks or punches). The items listed above may not be a complete list of foods and beverages to avoid. Contact your dietitian for more information.  This information is not intended to replace advice given to you by your health care provider. Make sure you discuss any questions you have with your health care provider. Document   Released: 08/11/2004 Document Revised: 03/31/2016 Document Reviewed: 08/28/2013 Elsevier Interactive Patient Education  2017 [employer] Prediabetes Prediabetes is the condition of having a blood sugar  (blood glucose) level that is higher than it should be, but not high enough for you to be diagnosed with type 2 diabetes. Having prediabetes puts you at risk for developing type 2 diabetes (type 2 diabetes mellitus). Prediabetes may be called impaired glucose tolerance or impaired fasting glucose. Prediabetes usually does not cause symptoms. Your health care provider can diagnose this condition with blood tests. You may be tested for prediabetes if you are overweight and if you have at least one other risk factor for prediabetes. Risk factors for prediabetes include:  Having a family member with type 2 diabetes.  Being overweight or obese.  Being older than age 85.  Being of American-Indian, African-American, Hispanic/Latino, or Asian/Pacific Islander descent.  Having an inactive (sedentary) lifestyle.  Having a history of gestational diabetes or polycystic ovarian syndrome (PCOS).  Having low levels of good cholesterol (HDL-C) or high levels of blood fats (triglycerides).  Having high blood pressure. What is blood glucose and how is blood glucose measured?   Blood glucose refers to the amount of glucose in your bloodstream. Glucose comes from eating foods that contain sugars and starches (carbohydrates) that the body breaks down into glucose. Your blood glucose level may be measured in mg/dL (milligrams per deciliter) or mmol/L (millimoles per liter).Your blood glucose may be checked with one or more of the following blood tests:  A fasting blood glucose (FBG) test. You will not be allowed to eat (you will fast) for at least 8 hours before a blood sample is taken.  A normal range for FBG is 70-100 mg/dl (3.9-5.6 mmol/L).  An A1c (hemoglobin A1c) blood test. This test provides information about blood glucose control over the previous 2?50months.  An oral glucose tolerance test (OGTT). This test measures your blood glucose twice:  After fasting. This is your baseline level.  Two  hours after you drink a beverage that contains glucose. You may be diagnosed with prediabetes:  If your FBG is 100?125 mg/dL (5.6-6.9 mmol/L).  If your A1c level is 5.7?6.4%.  If your OGGT result is 140?199 mg/dL (7.8-11 mmol/L). These blood tests may be repeated to confirm your diagnosis. What happens if blood glucose is too high? The pancreas produces a hormone (insulin) that helps move glucose from the bloodstream into cells. When cells in the body do not respond properly to insulin that the body makes (insulin resistance), excess glucose builds up in the blood instead of going into cells. As a result, high blood glucose (hyperglycemia) can develop, which can cause many complications. This is a symptom of prediabetes. What can happen if blood glucose stays higher than normal for a long time? Having high blood glucose for a long time is dangerous. Too much glucose in your blood can damage your nerves and blood vessels. Long-term damage can lead to complications from diabetes, which may include:  Heart disease.  Stroke.  Blindness.  Kidney disease.  Depression.  Poor circulation in the feet and legs, which could lead to surgical removal (amputation) in severe cases. How can prediabetes be prevented from turning into type 2 diabetes?   To help prevent type 2 diabetes, take the following actions:  Be physically active.  Do moderate-intensity physical activity for at least 30 minutes on at least 5 days of the week, or as much as told by your health care provider. This  could be brisk walking, biking, or water aerobics.  Ask your health care provider what activities are safe for you. A mix of physical activities may be best, such as walking, swimming, cycling, and strength training.  Lose weight as told by your health care provider.  Losing 5-7% of your body weight can reverse insulin resistance.  Your health care provider can determine how much weight loss is best for you and can  help you lose weight safely.  Follow a healthy meal plan. This includes eating lean proteins, complex carbohydrates, fresh fruits and vegetables, low-fat dairy products, and healthy fats.  Follow instructions from your health care provider about eating or drinking restrictions.  Make an appointment to see a diet and nutrition specialist (registered dietitian) to help you create a healthy eating plan that is right for you.  Do not smoke or use any tobacco products, such as cigarettes, chewing tobacco, and e-cigarettes. If you need help quitting, ask your health care provider.  Take over-the-counter and prescription medicines as told by your health care provider. You may be prescribed medicines that help lower the risk of type 2 diabetes. This information is not intended to replace advice given to you by your health care provider. Make sure you discuss any questions you have with your health care provider. Document Released: 02/15/2016 Document Revised: 03/31/2016 Document Reviewed: 12/15/2015 Elsevier Interactive Patient Education  2017 [place].  Monitor blood pressure and be in touch if consistently > 140/90.   Let's plan repeat PSA in about 6 months.

## 2016-10-18 DIAGNOSIS — Z23 Encounter for immunization: Secondary | ICD-10-CM | POA: Diagnosis not present

## 2016-11-14 ENCOUNTER — Telehealth: Payer: Self-pay | Admitting: Family Medicine

## 2016-11-14 NOTE — Telephone Encounter (Signed)
Patient dropped his blood pressure reading and it would like to increase his medication.  Contact Info: [phone]

## 2016-11-15 MED ORDER — LISINOPRIL 10 MG PO TABS: 10.0000 mg | ORAL_TABLET | Freq: Every day | ORAL | 3 refills | Status: DC

## 2016-11-15 NOTE — Telephone Encounter (Signed)
I would add Lisinopril 10 mg po qd and office follow up in about 3 weeks to reassess.

## 2016-11-15 NOTE — Telephone Encounter (Signed)
Placed readings on your desk.

## 2016-11-15 NOTE — Telephone Encounter (Signed)
Spoke with pt--he is aware of medication change and appt scheduled!

## 2016-12-06 ENCOUNTER — Encounter: Payer: Self-pay | Admitting: Family Medicine

## 2016-12-06 ENCOUNTER — Ambulatory Visit (INDEPENDENT_AMBULATORY_CARE_PROVIDER_SITE_OTHER): Payer: Medicare Other | Admitting: Family Medicine

## 2016-12-06 VITALS — BP 114/70 | HR 82 | Ht 75.0 in | Wt 261.0 lb

## 2016-12-06 DIAGNOSIS — R7303 Prediabetes: Secondary | ICD-10-CM

## 2016-12-06 DIAGNOSIS — I10 Essential (primary) hypertension: Secondary | ICD-10-CM

## 2016-12-06 LAB — BASIC METABOLIC PANEL
BUN: 16 mg/dL (ref 6–23)
CALCIUM: 9.4 mg/dL (ref 8.4–10.5)
CO2: 28 mEq/L (ref 19–32)
CREATININE: 1.03 mg/dL (ref 0.40–1.50)
Chloride: 105 mEq/L (ref 96–112)
GFR: 75.53 mL/min (ref >60.00)
Glucose, Bld: 117 mg/dL — ABNORMAL HIGH (ref 70–99)
Potassium: 3.9 mEq/L (ref 3.5–5.1)
SODIUM: 140 meq/L (ref 135–145)

## 2016-12-06 LAB — HEMOGLOBIN A1C: HEMOGLOBIN A1C: 6.3 % (ref 4.6–6.5)

## 2016-12-06 NOTE — Progress Notes (Signed)
Subjective:     Patient ID: Paul Townsend, male   DOB: 07/22/1946, 71 y.o.   MRN: [id number]  HPI Patient seen for follow-up hypertension. He has been on HCTZ and he had several readings recently over [id number] systolic and we added lisinopril 10 mg daily. He is tolerating with no side effects. Home blood pressures reviewed and he has been fairly consistently below 0000000 systolic and below 80 diastolic. No headaches. No chest pains. No dizziness. No cough.  Recent fasting blood sugar 122. He has history of prediabetes. We did not have baseline A1c and planned on getting that today. Exercise has been somewhat limited. He has had bilateral total knee replacements  Past Medical History:  Diagnosis Date  . Arthritis   . Hyperlipidemia   . Hypertension    Past Surgical History:  Procedure Laterality Date  . ORIF Rt femur Right 01/05/2014  . TONSILLECTOMY    . TOTAL KNEE ARTHROPLASTY    . VARICOSE VEIN SURGERY      reports that he has quit smoking. He has never used smokeless tobacco. He reports that he drinks alcohol. His drug history is not on file. family history includes Arthritis in his father; Cancer in his mother; Heart disease (age of onset: 79) in his father; Hypertension in his father. No Known Allergies   Review of Systems  Constitutional: Negative for fatigue.  Eyes: Negative for visual disturbance.  Respiratory: Negative for cough, chest tightness and shortness of breath.   Cardiovascular: Negative for chest pain, palpitations and leg swelling.  Neurological: Negative for dizziness, syncope, weakness, light-headedness and headaches.       Objective:   Physical Exam  Constitutional: He is oriented to person, place, and time. He appears well-developed and well-nourished.  HENT:  Right Ear: External ear normal.  Left Ear: External ear normal.  Mouth/Throat: Oropharynx is clear and moist.  Eyes: Pupils are equal, round, and reactive to light.  Neck: Neck supple. No  thyromegaly present.  Cardiovascular: Normal rate and regular rhythm.   Pulmonary/Chest: Effort normal and breath sounds normal. No respiratory distress. He has no wheezes. He has no rales.  Musculoskeletal: He exhibits no edema.  Neurological: He is alert and oriented to person, place, and time.       Assessment:     #1 hypertension greatly improved with addition of recent lisinopril  #2 prediabetes    Plan:     -Check basic metabolic panel (with recent initiation of ACE inhibitor) -Establish more consistent exercise -Reduce sugars and starches -Check A1c  Eulas Post MD  Primary [facility] at [facility]

## 2016-12-06 NOTE — Progress Notes (Signed)
Pre visit review using our clinic review tool, if applicable. No additional management support is needed unless otherwise documented below in the visit note. 

## 2016-12-06 NOTE — Patient Instructions (Signed)
Hyperglycemia  Hyperglycemia occurs when the level of sugar (glucose) in the blood is too high. Glucose is a type of sugar that provides the body's main source of energy. Certain hormones (insulin and glucagon) control the level of glucose in the blood. Insulin lowers blood glucose, and glucagon increases blood glucose. Hyperglycemia can result from having too little insulin in the bloodstream, or from the body not responding normally to insulin.  Hyperglycemia occurs most often in people who have diabetes (diabetes mellitus), but it can happen in people who do not have diabetes. It can develop quickly, and it can be life-threatening if it causes you to become severely dehydrated (diabetic ketoacidosis or hyperglycemic hyperosmolar state). Severe hyperglycemia is a medical emergency.  What are the causes?  If you have diabetes, hyperglycemia may be caused by:  · Diabetes medicine.  · Medicines that increase blood glucose or affect your diabetes control.  · Not eating enough, or not eating often enough.  · Changes in physical activity level.  · Being sick or having an infection.    If you have prediabetes or undiagnosed diabetes:  · Hyperglycemia may be caused by those conditions.    If you do not have diabetes, hyperglycemia may be caused by:  · Certain medicines, including steroid medicines, beta-blockers, epinephrine, and thiazide diuretics.  · Stress.  · Serious illness.  · Surgery.  · Diseases of the pancreas.  · Infection.    What increases the risk?  Hyperglycemia is more likely to develop in people who have risk factors for diabetes, such as:  · Having a family member with diabetes.  · Having a gene for type 1 diabetes that is passed from parent to child (inherited).  · Living in an area with cold weather conditions.  · Exposure to certain viruses.  · Certain conditions in which the body's disease-fighting (immune) system attacks itself (autoimmune disorders).  · Being overweight or obese.  · Having an  inactive (sedentary) lifestyle.  · Having been diagnosed with insulin resistance.  · Having a history of prediabetes, gestational diabetes, or polycystic ovarian syndrome (PCOS).  · Being of American-Indian, African-American, Hispanic/Latino, or Asian/Pacific Islander descent.    What are the signs or symptoms?  Hyperglycemia may not cause any symptoms. If you do have symptoms, they may include early warning signs, such as:  · Increased thirst.  · Hunger.  · Feeling very tired.  · Needing to urinate more often than usual.  · Blurry vision.    Other symptoms may develop if hyperglycemia gets worse, such as:  · Dry mouth.  · Loss of appetite.  · Fruity-smelling breath.  · Weakness.  · Unexpected or rapid weight gain or weight loss.  · Tingling or numbness in the hands or feet.  · Headache.  · Skin that does not quickly return to normal after being lightly pinched and released (poor skin turgor).  · Abdominal pain.  · Cuts or bruises that are slow to heal.    How is this diagnosed?  Hyperglycemia is diagnosed with a blood test to measure your blood glucose level. This blood test is usually done while you are having symptoms. Your health care provider may also do a physical exam and review your medical history.  You may have more tests to determine the cause of your hyperglycemia, such as:  · A fasting blood glucose (FBG) test. You will not be allowed to eat (you will fast) for at least 8 hours before a blood sample is   taken.  · An A1c (hemoglobin A1c) blood test. This provides information about blood glucose control over the previous 2-3 months.  · An oral glucose tolerance test (OGTT). This measures your blood glucose at two times:  ? After fasting. This is your baseline blood glucose level.  ? Two hours after drinking a beverage that contains glucose.    How is this treated?  Treatment depends on the cause of your hyperglycemia. Treatment may include:  · Taking medicine to regulate your blood glucose levels. If you  take insulin or other diabetes medicines, your medicine or dosage may be adjusted.  · Lifestyle changes, such as exercising more, eating healthier foods, or losing weight.  · Treating an illness or infection, if this caused your hyperglycemia.  · Checking your blood glucose more often.  · Stopping or reducing steroid medicines, if these caused your hyperglycemia.    If your hyperglycemia becomes severe and it results in hyperglycemic hyperosmolar state, you must be hospitalized and given IV fluids.  Follow these instructions at home:  General instructions   · Take over-the-counter and prescription medicines only as told by your health care provider.  · Do not use any products that contain nicotine or tobacco, such as cigarettes and e-cigarettes. If you need help quitting, ask your health care provider.  · Limit alcohol intake to no more than 1 drink per day for nonpregnant women and 2 drinks per day for men. One drink equals 12 oz of beer, 5 oz of wine, or 1½ oz of hard liquor.  · Learn to manage stress. If you need help with this, ask your health care provider.  · Keep all follow-up visits as told by your health care provider. This is important.  Eating and drinking   · Maintain a healthy weight.  · Exercise regularly, as directed by your health care provider.  · Stay hydrated, especially when you exercise, get sick, or spend time in hot temperatures.  · Eat healthy foods, such as:  ? Lean proteins.  ? Complex carbohydrates.  ? Fresh fruits and vegetables.  ? Low-fat dairy products.  ? Healthy fats.  · Drink enough fluid to keep your urine clear or pale yellow.  If you have diabetes:     · Make sure you know the symptoms of hyperglycemia.  · Follow your diabetes management plan, as told by your health care provider. Make sure you:  ? Take your insulin and medicines as directed.  ? Follow your exercise plan.  ? Follow your meal plan. Eat on time, and do not skip meals.  ? Check your blood glucose as often as  directed. Make sure to check your blood glucose before and after exercise. If you exercise longer or in a different way than usual, check your blood glucose more often.  ? Follow your sick day plan whenever you cannot eat or drink normally. Make this plan in advance with your health care provider.  · Share your diabetes management plan with people in your workplace, school, and household.  · Check your urine for ketones when you are ill and as told by your health care provider.  · Carry a medical alert card or wear medical alert jewelry.  Contact a health care provider if:  · Your blood glucose is at or above 240 mg/dL (13.3 mmol/L) for 2 days in a row.  · You have problems keeping your blood glucose in your target range.  · You have frequent episodes of hyperglycemia.    Get help right away if:  · You have difficulty breathing.  · You have a change in how you think, feel, or act (mental status).  · You have nausea or vomiting that does not go away.  These symptoms may represent a serious problem that is an emergency. Do not wait to see if the symptoms will go away. Get medical help right away. Call your local emergency services (911 in the [place] Do not drive yourself to the hospital.  Summary  · Hyperglycemia occurs when the level of sugar (glucose) in the blood is too high.  · Hyperglycemia is diagnosed with a blood test to measure your blood glucose level. This blood test is usually done while you are having symptoms. Your health care provider may also do a physical exam and review your medical history.  · If you have diabetes, follow your diabetes management plan as told by your health care provider.  · Contact your health care provider if you have problems keeping your blood glucose in your target range.  This information is not intended to replace advice given to you by your health care provider. Make sure you discuss any questions you have with your health care provider.  Document Released: 04/19/2001 Document  Revised: 07/11/2016 Document Reviewed: 07/11/2016  Elsevier Interactive Patient Education © 2017 [employer]

## 2017-02-21 DIAGNOSIS — Z96653 Presence of artificial knee joint, bilateral: Secondary | ICD-10-CM | POA: Diagnosis not present

## 2017-02-21 DIAGNOSIS — S72491D Other fracture of lower end of right femur, subsequent encounter for closed fracture with routine healing: Secondary | ICD-10-CM | POA: Diagnosis not present

## 2017-02-21 DIAGNOSIS — Z471 Aftercare following joint replacement surgery: Secondary | ICD-10-CM | POA: Diagnosis not present

## 2017-02-21 DIAGNOSIS — M25462 Effusion, left knee: Secondary | ICD-10-CM | POA: Diagnosis not present

## 2017-02-21 DIAGNOSIS — Z96651 Presence of right artificial knee joint: Secondary | ICD-10-CM | POA: Diagnosis not present

## 2017-02-21 DIAGNOSIS — M25461 Effusion, right knee: Secondary | ICD-10-CM | POA: Diagnosis not present

## 2017-02-21 DIAGNOSIS — M2342 Loose body in knee, left knee: Secondary | ICD-10-CM | POA: Diagnosis not present

## 2017-03-09 ENCOUNTER — Other Ambulatory Visit: Payer: Self-pay | Admitting: Family Medicine

## 2017-03-31 DIAGNOSIS — H10413 Chronic giant papillary conjunctivitis, bilateral: Secondary | ICD-10-CM | POA: Diagnosis not present

## 2017-07-19 DIAGNOSIS — H33303 Unspecified retinal break, bilateral: Secondary | ICD-10-CM | POA: Diagnosis not present

## 2017-07-19 DIAGNOSIS — H1851 Endothelial corneal dystrophy: Secondary | ICD-10-CM | POA: Diagnosis not present

## 2017-07-19 DIAGNOSIS — H35372 Puckering of macula, left eye: Secondary | ICD-10-CM | POA: Diagnosis not present

## 2017-07-19 DIAGNOSIS — Z961 Presence of intraocular lens: Secondary | ICD-10-CM | POA: Diagnosis not present

## 2017-09-10 ENCOUNTER — Other Ambulatory Visit: Payer: Self-pay | Admitting: Family Medicine

## 2017-09-11 NOTE — Telephone Encounter (Signed)
Copied from [place] [id number]. Topic: General - Other >> Sep 11, 2017 12:15 PM Suggs, Tammy E wrote: Reason for CRM: patient needs a refill on lisinopril 10 mg.  He is requesting a 90-day supply.  Pharmacy: [facility] on [address].

## 2017-09-19 ENCOUNTER — Ambulatory Visit (INDEPENDENT_AMBULATORY_CARE_PROVIDER_SITE_OTHER): Payer: Medicare Other | Admitting: Family Medicine

## 2017-09-19 ENCOUNTER — Ambulatory Visit (INDEPENDENT_AMBULATORY_CARE_PROVIDER_SITE_OTHER)
Admission: RE | Admit: 2017-09-19 | Discharge: 2017-09-19 | Disposition: A | Discharge status: Home / Self Care | Payer: Medicare Other | Source: Ambulatory Visit | Attending: Family Medicine | Admitting: Family Medicine

## 2017-09-19 ENCOUNTER — Encounter: Payer: Self-pay | Admitting: Family Medicine

## 2017-09-19 VITALS — BP 116/77 | HR 94 | Temp 98.9°F | Resp 16 | Ht 75.0 in | Wt 252.1 lb

## 2017-09-19 DIAGNOSIS — R7303 Prediabetes: Secondary | ICD-10-CM

## 2017-09-19 DIAGNOSIS — K5900 Constipation, unspecified: Secondary | ICD-10-CM | POA: Diagnosis not present

## 2017-09-19 DIAGNOSIS — R109 Unspecified abdominal pain: Secondary | ICD-10-CM

## 2017-09-19 DIAGNOSIS — R972 Elevated prostate specific antigen [PSA]: Secondary | ICD-10-CM | POA: Diagnosis not present

## 2017-09-19 LAB — COMPREHENSIVE METABOLIC PANEL
ALK PHOS: 61 U/L (ref 39–117)
ALT: 20 U/L (ref 0–53)
AST: 16 U/L (ref 0–37)
Albumin: 4.4 g/dL (ref 3.5–5.2)
BILIRUBIN TOTAL: 1.8 mg/dL — AB (ref 0.2–1.2)
BUN: 14 mg/dL (ref 6–23)
CO2: 29 mEq/L (ref 19–32)
Calcium: 9.6 mg/dL (ref 8.4–10.5)
Chloride: 101 mEq/L (ref 96–112)
Creatinine, Ser: 1 mg/dL (ref 0.40–1.50)
GFR: 77.98 mL/min (ref >60.00)
GLUCOSE: 129 mg/dL — AB (ref 70–99)
Potassium: 4.5 mEq/L (ref 3.5–5.1)
Sodium: 139 mEq/L (ref 135–145)
TOTAL PROTEIN: 7.3 g/dL (ref 6.0–8.3)

## 2017-09-19 LAB — CBC WITH DIFFERENTIAL/PLATELET
BASOS ABS: 0.1 10*3/uL (ref 0.0–0.1)
BASOS PCT: 0.5 % (ref 0.0–3.0)
EOS ABS: 0.4 10*3/uL (ref 0.0–0.7)
Eosinophils Relative: 3.6 % (ref 0.0–5.0)
HCT: 49.2 % (ref 39.0–52.0)
HEMOGLOBIN: 16.5 g/dL (ref 13.0–17.0)
LYMPHS PCT: 13.5 % (ref 12.0–46.0)
Lymphs Abs: 1.5 10*3/uL (ref 0.7–4.0)
MCHC: 33.5 g/dL (ref 30.0–36.0)
MCV: 96.8 fl (ref 78.0–100.0)
Monocytes Absolute: 1.2 10*3/uL — ABNORMAL HIGH (ref 0.1–1.0)
Monocytes Relative: 10.6 % (ref 3.0–12.0)
NEUTROS PCT: 71.8 % (ref 43.0–77.0)
Neutro Abs: 8 10*3/uL — ABNORMAL HIGH (ref 1.4–7.7)
Platelets: 237 10*3/uL (ref 150.0–400.0)
RBC: 5.08 Mil/uL (ref 4.22–5.81)
RDW: 13.2 % (ref 11.5–15.5)
WBC: 11.1 10*3/uL — AB (ref 4.0–10.5)

## 2017-09-19 LAB — HEMOGLOBIN A1C: Hgb A1c MFr Bld: 6.3 % (ref 4.6–6.5)

## 2017-09-19 LAB — PSA: PSA: 1.96 ng/mL (ref 0.10–4.00)

## 2017-09-19 MED ORDER — LACTULOSE 10 GM/15ML PO SOLN: 10.0000 g | Freq: Two times a day (BID) | ORAL | 0 refills | Status: AC

## 2017-09-19 NOTE — Patient Instructions (Signed)
  [name] I have seen you today for an acute visit.  A few things to remember from today's visit:   Abdominal pain, unspecified abdominal location - Plan: CBC with Differential/Platelet, Comprehensive metabolic panel, DG Abd 2 Views  Constipation, unspecified constipation type - Plan: DG Abd 2 Views, lactulose (CHRONULAC) 10 GM/15ML solution    Follow a liquid diet for the next few days, small meals, adequate hydration.   GET HELP RIGHT AWAY IF:   The pain is does not go away within 2 hours.  Sudden severe/worsening pain.  You keep throwing up (vomiting).  The pain changes and is only in the right or left part of the belly.  Not being able to pass gas or poop.  You have bloody or tarry looking poop.   MAKE SURE YOU:   Understand these instructions.  Will watch your condition.  Will get help right away if you are not doing well or get worse.   If symptoms are persistent please arrange a follow up appointment.      In general please monitor for signs of worsening symptoms and seek immediate medical attention if any concerning.  If symptoms are not resolved in 3-4 days you should schedule a follow up appointment with your doctor, before if needed.  I hope you get better soon!

## 2017-09-19 NOTE — Progress Notes (Signed)
ACUTE VISIT   HPI:  Chief Complaint  Patient presents with  . Abdominal Cramping  . Bloated    Paul Townsend is a 71 y.o. male, who is here today complaining of 2 days abdominal pain. Constant "high level of discomfort", some intermittent cramps, diffuse but mainly RUQ,, 5/10. Exacerbated by food intake, and alleviated by passing gas. + Abdominal bloating and distention. + Fatigue. Last bowel movement yesterday morning, it was hard and he had to strain. He usually has 2 bowel movements daily. He denies associated fever, chills, cough, wheezing, chest discomfort, nausea, vomiting, blood in the stool, or urinary symptoms.  He started Tumeric 3 days ago, he is not sure if this could be causing his symptoms. No changes in his diet.  He has history of constipation and remembers having similar symptoms a few years ago.  Usually OTC medication help with this problem but they have not this time.  He acknowledges he does not drink enough fluids. He reports having colonoscopy about 3-4 years ago.  He denies history of alcohol abuse.  Lab Results  Component Value Date   TSH 1.62 10/10/2016   He is also requesting PSA and A1c done today.  He is reporting history of elevated PSA and prediabetes.  He wants to have labs done before his next CPE in 11/2016.   Review of Systems  Constitutional: Positive for fatigue. Negative for appetite change, fever and unexpected weight change.  HENT: Negative for mouth sores, sore throat and trouble swallowing.   Respiratory: Negative for cough, shortness of breath and wheezing.   Cardiovascular: Negative for palpitations and leg swelling.  Gastrointestinal: Positive for abdominal distention, abdominal pain and constipation. Negative for blood in stool, nausea and vomiting.  Endocrine: Negative for cold intolerance, heat intolerance, polydipsia, polyphagia and polyuria.  Genitourinary: Negative for decreased urine volume, dysuria and  hematuria.  Musculoskeletal: Negative for back pain, gait problem and myalgias.  Skin: Negative for pallor and rash.  Neurological: Negative for syncope, weakness and headaches.  Hematological: Negative for adenopathy. Does not bruise/bleed easily.  Psychiatric/Behavioral: Negative for confusion. The patient is not nervous/anxious.       Current Outpatient Medications on File Prior to Visit  Medication Sig Dispense Refill  . aspirin (GOODSENSE ASPIRIN) 81 MG chewable tablet Chew 81 mg by mouth.    . Calcium Citrate-Vitamin D (CALCIUM + D PO) Take by mouth.    . ezetimibe-simvastatin (VYTORIN) 10-20 MG tablet TAKE 1 TABLET AT BEDTIME 90 tablet 3  . hydrochlorothiazide (MICROZIDE) 12.5 MG capsule TAKE 1 CAPSULE DAILY 90 capsule 3  . lisinopril (PRINIVIL,ZESTRIL) 10 MG tablet take 1 tablet by mouth once daily 90 tablet 1  . sildenafil (VIAGRA) 100 MG tablet Take 1 tablet (100 mg total) by mouth daily as needed for erectile dysfunction. 6 tablet 11  . vitamin C (ASCORBIC ACID) 500 MG tablet Take 500 mg by mouth daily.      . Zinc Acetate, Oral, (ZINC ACETATE PO) Take by mouth.     No current facility-administered medications on file prior to visit.      Past Medical History:  Diagnosis Date  . Arthritis   . Hyperlipidemia   . Hypertension    No Known Allergies  Social History   Socioeconomic History  . Marital status: Married    Spouse name: None  . Number of children: None  . Years of education: None  . Highest education level: None  Social Needs  . Financial  resource strain: None  . Food insecurity - worry: None  . Food insecurity - inability: None  . Transportation needs - medical: None  . Transportation needs - non-medical: None  Occupational History  . None  Tobacco Use  . Smoking status: Former Research scientist (life sciences)  . Smokeless tobacco: Never Used  Substance and Sexual Activity  . Alcohol use: Yes  . Drug use: None  . Sexual activity: None  Other Topics Concern  . None    Social History Narrative  . None    Vitals:   09/19/17 1001  BP: 116/77  Pulse: 94  Resp: 16  Temp: 98.9 F (37.2 C)  SpO2: 97%   Body mass index is 31.51 kg/m.   Physical Exam  Nursing note and vitals reviewed. Constitutional: He is oriented to person, place, and time. He appears well-developed. He does not appear ill. No distress.  HENT:  Head: Normocephalic and atraumatic.  Mouth/Throat: Oropharynx is clear and moist and mucous membranes are normal.  Eyes: Conjunctivae are normal. No scleral icterus.  Cardiovascular: Normal rate and regular rhythm.  No murmur heard. Respiratory: Effort normal and breath sounds normal. No respiratory distress.  GI: Soft. Bowel sounds are normal. He exhibits no mass. There is no hepatomegaly. There is no tenderness. There is no CVA tenderness.  Musculoskeletal: He exhibits edema (Trace pitting LE edema,bilateral.). He exhibits no tenderness.  Lymphadenopathy:    He has no cervical adenopathy.  Neurological: He is alert and oriented to person, place, and time. He has normal strength. Coordination and gait normal.  Skin: Skin is warm. No rash noted. No erythema.  Psychiatric: He has a normal mood and affect.  Well-groomed, good eye contact.    ASSESSMENT AND PLAN:   Paul Townsend was seen today for abdominal cramping and bloated.  Diagnoses and all orders for this visit:  Abdominal pain, unspecified abdominal location  We discussed possible etiologies. Examination today does not suggest acute abdomen or abdominal obstruction. Plain abdominal upright  imaging was ordered today. Liquid diet was recommended. He was clearly instructed about warning signs. The recommendation will be given according to lab results.  -     CBC with Differential/Platelet -     Comprehensive metabolic panel -     DG Abd 2 Views; Future  Constipation, unspecified constipation type  Adequate fiber and fluid intake recommended. I recommended lactulose  twice daily until he has a good bowel movement on for no longer than 3 days. After this acute episode is resolved he can continue with OTC MiraLAX daily as needed. Instructed about warning signs. Follow-up with PCP as needed.  -     DG Abd 2 Views; Future -     lactulose (CHRONULAC) 10 GM/15ML solution; Take 15 mLs (10 g total) 2 (two) times daily for 3 days by mouth.  Prediabetes -     Hemoglobin A1c  Elevated PSA -     PSA    -PaulKathaleen Townsend was advised to seek immediate medical attention if sudden worsening symptoms or to follow if they persist or if new concerns arise, he voices understanding.       Devone Tousley G. [place], MD  [facility]. [place] office.

## 2017-09-21 ENCOUNTER — Other Ambulatory Visit: Payer: Self-pay | Admitting: Family Medicine

## 2017-10-20 ENCOUNTER — Encounter: Payer: Self-pay | Admitting: Family Medicine

## 2017-10-20 ENCOUNTER — Ambulatory Visit (INDEPENDENT_AMBULATORY_CARE_PROVIDER_SITE_OTHER): Payer: Medicare Other | Admitting: Family Medicine

## 2017-10-20 VITALS — BP 110/70 | HR 78 | Ht 75.0 in | Wt 250.0 lb

## 2017-10-20 DIAGNOSIS — Z0000 Encounter for general adult medical examination without abnormal findings: Secondary | ICD-10-CM

## 2017-10-20 DIAGNOSIS — I10 Essential (primary) hypertension: Secondary | ICD-10-CM

## 2017-10-20 DIAGNOSIS — Z1159 Encounter for screening for other viral diseases: Secondary | ICD-10-CM

## 2017-10-20 DIAGNOSIS — E785 Hyperlipidemia, unspecified: Secondary | ICD-10-CM

## 2017-10-20 LAB — LIPID PANEL
CHOL/HDL RATIO: 3
Cholesterol: 151 mg/dL (ref 0–200)
HDL: 57.4 mg/dL (ref >39.00)
LDL Cholesterol: 66 mg/dL (ref 0–99)
NONHDL: 93.26
Triglycerides: 135 mg/dL (ref 0.0–149.0)
VLDL: 27 mg/dL (ref 0.0–40.0)

## 2017-10-20 NOTE — Progress Notes (Signed)
Subjective:   Paul Townsend is a 71 y.o. male who presents for Medicare Annual/Subsequent preventive examination.  Retired in 86  3 books published;  Historical fiction  Just bought one level home at [employer] back  Both children live in [place]    Diet  Loss 9lbs;  Now have cereal; no sausage or biscuits Salad and nice dinner  Educated pre diabetes   Exercise Cycle an 9 miles x 1 hour Rowing machine 3rd session with a trainer    Fall 2015  Broke leg but took 8 months to heal    BMI 31.3  Cardiac Risk Factors include: advanced age (>60men, >84 women);dyslipidemia;family history of premature cardiovascular disease;hypertension;male gender;obesity (BMI >30kg/m2)   There are no preventive care reminders to display for this patient.  will check hepatitis C at his next blood draw      Objective:    Vitals: BP 110/70 (BP Location: Left Arm, Patient Position: Sitting, Cuff Size: Large)   Pulse 78   Ht 6\' 3"  (1.905 m)   Wt 250 lb (113.4 kg)   SpO2 97%   BMI 31.25 kg/m   Body mass index is 31.25 kg/m.  Advanced Directives 10/20/2017  Does Patient Have a Medical Advance Directive? Yes    Tobacco Social History   Tobacco Use  Smoking Status Former Smoker  Smokeless Tobacco Never Used  Tobacco Comment   Did not review today due to time limits      Counseling given: Yes Comment: Did not review today due to time limits   Hearing Screening Comments: Hearing issues Anyone can have a hearing scan Vision Screening Comments: Vision exam  [facility] Dr. Ellie Lunch        Past Medical History:  Diagnosis Date  . Arthritis   . Hyperlipidemia   . Hypertension    Past Surgical History:  Procedure Laterality Date  . ORIF Rt femur Right 01/05/2014  . TONSILLECTOMY    . TOTAL KNEE ARTHROPLASTY    . VARICOSE VEIN SURGERY     Family History  Problem Relation Age of Onset  . Cancer Mother        colon  . Arthritis Father   .  Hypertension Father   . Heart disease Father 10       MI   Social History   Socioeconomic History  . Marital status: Married    Spouse name: None  . Number of children: None  . Years of education: None  . Highest education level: None  Social Needs  . Financial resource strain: None  . Food insecurity - worry: None  . Food insecurity - inability: None  . Transportation needs - medical: None  . Transportation needs - non-medical: None  Occupational History  . None  Tobacco Use  . Smoking status: Former Research scientist (life sciences)  . Smokeless tobacco: Never Used  . Tobacco comment: Did not review today due to time limits   Substance and Sexual Activity  . Alcohol use: Yes  . Drug use: None  . Sexual activity: None  Other Topics Concern  . None  Social History Narrative  . None    Outpatient Encounter Medications as of 10/20/2017  Medication Sig  . aspirin (GOODSENSE ASPIRIN) 81 MG chewable tablet Chew 81 mg by mouth.  . Calcium Citrate-Vitamin D (CALCIUM + D PO) Take by mouth.  . ezetimibe-simvastatin (VYTORIN) 10-20 MG tablet TAKE 1 TABLET AT BEDTIME  . hydrochlorothiazide (MICROZIDE) 12.5 MG capsule TAKE 1 CAPSULE  DAILY  . lisinopril (PRINIVIL,ZESTRIL) 10 MG tablet take 1 tablet by mouth once daily  . sildenafil (VIAGRA) 100 MG tablet Take 1 tablet (100 mg total) by mouth daily as needed for erectile dysfunction.  . Zinc Acetate, Oral, (ZINC ACETATE PO) Take by mouth.  . vitamin C (ASCORBIC ACID) 500 MG tablet Take 500 mg by mouth daily.     No facility-administered encounter medications on file as of 10/20/2017.     Activities of Daily Living In your present state of health, do you have any difficulty performing the following activities: 10/20/2017  Hearing? N  Vision? N  Difficulty concentrating or making decisions? N  Walking or climbing stairs? N  Dressing or bathing? N  Doing errands, shopping? N  Preparing Food and eating ? N  Using the Toilet? N  In the past six months,  have you accidently leaked urine? N  Do you have problems with loss of bowel control? N  Managing your Medications? N  Managing your Finances? N  Housekeeping or managing your Housekeeping? N  Some recent data might be hidden    Patient Care Team: Eulas Post, MD as PCP - General   Assessment:   This is a routine wellness examination for Paul Townsend.  Exercise Activities and Dietary recommendations Current Exercise Habits: Structured exercise class, Time (Minutes): 60, Frequency (Times/Week): 3, Weekly Exercise (Minutes/Week): 180, Intensity: Moderate  Goals    . Weight (lb) < 239 lb (108.4 kg)     Ongoing weight loss by cutting back calories and drinking water        Fall Risk Fall Risk  10/20/2017 10/18/2016 05/27/2015 05/27/2015  Falls in the past year? 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 Hepatitis A 04/16/2012  . Hepatitis A, Adult 05/20/2016  . Influenza Whole 08/23/2010  . Influenza, High Dose Seasonal PF 10/29/2014, 09/18/2015, 10/18/2016  . Influenza,inj,Quad PF,6+ Mos 08/19/2013  . Influenza-Unspecified 07/11/2017  . Pneumococcal Conjugate-13 05/27/2015  . Pneumococcal Polysaccharide-23 08/23/2010  . Tdap 04/16/2012  . Typhoid Inactivated 05/20/2016  . Zoster 09/18/2015  . Zoster Recombinat  (Shingrix) 02/28/2017    Qualifies for Shingles Vaccine?  He has had both of the shingrix vaccinations. He will let the office know the date of which he rec'd the last one.   Screening Tests Health Maintenance  Topic Date Due  . TETANUS/TDAP  04/16/2022  . COLONOSCOPY  08/01/2023  . INFLUENZA VACCINE  Completed  . Hepatitis C Screening  Completed  . PNA vac Low Risk Adult  Completed    Agreed to hep c in the future. [facility] not use [facility] at [facility]     Plan:     PCP Notes   Health Maintenance Agreed to have hep c at his next blood draw A1c trending up; is  in process of losing weight now  Abnormal Screens  As noted A1c   Referrals  None today   Patient concerns; Is watching his diet; cutting back on sweets and biscuits etc.  Nurse Concerns; As noted  Next PCP apt Today       I have personally reviewed and noted the following in the patient's chart:   . Medical and social history . Use of alcohol, tobacco or illicit drugs  . Current medications and supplements . Functional ability and status . Nutritional status . Physical activity . Advanced directives . List of other physicians . Hospitalizations, surgeries, and ER visits in previous 12 months . Vitals . Screenings to include cognitive, depression, and falls . Referrals and appointments  In addition, I have reviewed and discussed with patient certain preventive protocols, quality metrics, and best practice recommendations. A written personalized care plan for preventive services as well as general preventive health recommendations were provided to patient.     Wynetta Fines, RN  10/20/2017  Agree with assessment as above  Eulas Post MD [facility]

## 2017-10-20 NOTE — Patient Instructions (Addendum)
[name] , Thank you for taking time to come for your Medicare Wellness Visit. I appreciate your ongoing commitment to your health goals. Please review the following plan we discussed and let me know if I can assist you in the future.   Medicare now request all "baby boomers" test for possible exposure to Hepatitis C. Many may have been exposed due to dental work, tatoo's, vaccinations when young. The Hepatitis C virus is dormant for many years and then sometimes will cause liver cancer. If you gave blood in the past 15 years, you were most likely checked for Hep C. If you rec'd blood; you may want to consider testing or if you are high risk for any other reason.   Shingrix is a vaccine for the prevention of Shingles in Adults 50 and older.  If you are on Medicare, you can request a prescription from your doctor to be filled at a pharmacy.  Please check with your benefits regarding applicable copays or out of pocket expenses.  The Shingrix is given in 2 vaccines approx 8 weeks apart. You must receive the 2nd dose prior to 6 months from receipt of the first.     Veterans want to access their VA benefits can call [facility]  will [address] [place], [place] [address]  Phone: [phone] Or [phone] Fax: [phone]  OR   They can to to the [facility] office and call [phone] and If the prompt starts, hit 0 and then ask for eligibility. They will assist you to sign up for medical benefits.   Call us with the date of your 2nd shingles   Educated regarding prediabetes and numbers;  A1c ranges from 5.8 to 6.5 or fasting Blood sugar > 115 -126; (126 is diabetic)   Risk: >45yo; family hx; overweight or obese; African American; Hispanic; Latino; American [place] American; [place]; history of diabetes when pregnant; or birth to a baby weighing over 9 lbs. Being less physically active than 30 minutes; 3 times a week;    Prevention; Losing a modest 7 to 8 lbs; If over 200 lbs; 10 to 14 lbs;  Choose healthier foods; colorful veggies; fish or lean meats; drinks water Reduce portion size Start exercising; 30 minutes of fast walking x 30 minutes per day/ 60 min for weight loss        These are the goals we discussed: Goals    . Weight (lb) < 239 lb (108.4 kg)     Ongoing weight loss by cutting back calories and drinking water        This is a list of the screening recommended for you and due dates:  Health Maintenance  Topic Date Due  .  Hepatitis C: One time screening is recommended by Center for Disease Control  (CDC) for  adults born from [birth year] through 1966.   Mar 24, 1946  . Tetanus Vaccine  04/16/2022  . Colon Cancer Screening  08/01/2023  . Flu Shot  Completed  . Pneumonia vaccines  Completed     Health Maintenance, Male A healthy lifestyle and preventive care is important for your health and wellness. Ask your health care provider about what schedule of regular examinations is right for you. What should I know about weight and diet? Eat a Healthy Diet  Eat plenty of vegetables, fruits, whole grains, low-fat dairy products, and lean protein.  Do not eat a lot of foods high in solid fats, added sugars,  or salt.  Maintain a Healthy Weight Regular exercise can help you achieve or maintain a healthy weight. You should:  Do at least 150 minutes of exercise each week. The exercise should increase your heart rate and make you sweat (moderate-intensity exercise).  Do strength-training exercises at least twice a week.  Watch Your Levels of Cholesterol and Blood Lipids  Have your blood tested for lipids and cholesterol every 5 years starting at 71 years of age. If you are at high risk for heart disease, you should start having your blood tested when you are 72 years old. You may need to have your cholesterol levels checked more often if: ? Your lipid or cholesterol levels are high. ? You are  older than 72 years of age. ? You are at high risk for heart disease.  What should I know about cancer screening? Many types of cancers can be detected early and may often be prevented. Lung Cancer  You should be screened every year for lung cancer if: ? You are a current smoker who has smoked for at least 30 years. ? You are a former smoker who has quit within the past 15 years.  Talk to your health care provider about your screening options, when you should start screening, and how often you should be screened.  Colorectal Cancer  Routine colorectal cancer screening usually begins at 72 years of age and should be repeated every 5-10 years until you are 72 years old. You may need to be screened more often if early forms of precancerous polyps or small growths are found. Your health care provider may recommend screening at an earlier age if you have risk factors for colon cancer.  Your health care provider may recommend using home test kits to check for hidden blood in the stool.  A small camera at the end of a tube can be used to examine your colon (sigmoidoscopy or colonoscopy). This checks for the earliest forms of colorectal cancer.  Prostate and Testicular Cancer  Depending on your age and overall health, your health care provider may do certain tests to screen for prostate and testicular cancer.  Talk to your health care provider about any symptoms or concerns you have about testicular or prostate cancer.  Skin Cancer  Check your skin from head to toe regularly.  Tell your health care provider about any new moles or changes in moles, especially if: ? There is a change in a mole's size, shape, or color. ? You have a mole that is larger than a pencil eraser.  Always use sunscreen. Apply sunscreen liberally and repeat throughout the day.  Protect yourself by wearing long sleeves, pants, a wide-brimmed hat, and sunglasses when outside.  What should I know about heart disease,  diabetes, and high blood pressure?  If you are 69-83 years of age, have your blood pressure checked every 3-5 years. If you are 30 years of age or older, have your blood pressure checked every year. You should have your blood pressure measured twice-once when you are at a hospital or clinic, and once when you are not at a hospital or clinic. Record the average of the two measurements. To check your blood pressure when you are not at a hospital or clinic, you can use: ? An automated blood pressure machine at a pharmacy. ? A home blood pressure monitor.  Talk to your health care provider about your target blood pressure.  If you are between 53-55 years old, ask your health  care provider if you should take aspirin to prevent heart disease.  Have regular diabetes screenings by checking your fasting blood sugar level. ? If you are at a normal weight and have a low risk for diabetes, have this test once every three years after the age of 74. ? If you are overweight and have a high risk for diabetes, consider being tested at a younger age or more often.  A one-time screening for abdominal aortic aneurysm (AAA) by ultrasound is recommended for men aged 73-75 years who are current or former smokers. What should I know about preventing infection? Hepatitis B If you have a higher risk for hepatitis B, you should be screened for this virus. Talk with your health care provider to find out if you are at risk for hepatitis B infection. Hepatitis C Blood testing is recommended for:  Everyone born from [birth year] through 1965.  Anyone with known risk factors for hepatitis C.  Sexually Transmitted Diseases (STDs)  You should be screened each year for STDs including gonorrhea and chlamydia if: ? You are sexually active and are younger than 72 years of age. ? You are older than 72 years of age and your health care provider tells you that you are at risk for this type of infection. ? Your sexual activity has  changed since you were last screened and you are at an increased risk for chlamydia or gonorrhea. Ask your health care provider if you are at risk.  Talk with your health care provider about whether you are at high risk of being infected with HIV. Your health care provider may recommend a prescription medicine to help prevent HIV infection.  What else can I do?  Schedule regular health, dental, and eye exams.  Stay current with your vaccines (immunizations).  Do not use any tobacco products, such as cigarettes, chewing tobacco, and e-cigarettes. If you need help quitting, ask your health care provider.  Limit alcohol intake to no more than 2 drinks per day. One drink equals 12 ounces of beer, 5 ounces of wine, or 1 ounces of hard liquor.  Do not use street drugs.  Do not share needles.  Ask your health care provider for help if you need support or information about quitting drugs.  Tell your health care provider if you often feel depressed.  Tell your health care provider if you have ever been abused or do not feel safe at home. This information is not intended to replace advice given to you by your health care provider. Make sure you discuss any questions you have with your health care provider. Document Released: 04/21/2008 Document Revised: 06/22/2016 Document Reviewed: 07/28/2015 Elsevier Interactive Patient Education  [employer].

## 2017-10-20 NOTE — Progress Notes (Signed)
Subjective:     Patient ID: Paul Townsend, male   DOB: 13-Mar-1946, 71 y.o.   MRN: [id number]  HPI Patient here for Medicare wellness visit and medical follow-up. His chronic problems include history of hyperlipidemia, hypertension, prediabetes. He had some recent labs significant for A1c 6.3%. He is due for repeat lab- lipids. He is currently taking Vytorin. He recently ran out of blood pressure medications. Has not had previous hepatitis C screening.  Was seen for some abdominal pain back in November and that has resolved at this time.  Labs from that visit reviewed.  Past Medical History:  Diagnosis Date  . Arthritis   . Hyperlipidemia   . Hypertension    Past Surgical History:  Procedure Laterality Date  . ORIF Rt femur Right 01/05/2014  . TONSILLECTOMY    . TOTAL KNEE ARTHROPLASTY    . VARICOSE VEIN SURGERY      reports that he has quit smoking. he has never used smokeless tobacco. He reports that he drinks alcohol. His drug history is not on file. family history includes Arthritis in his father; Cancer in his mother; Heart disease (age of onset: 57) in his father; Hypertension in his father. No Known Allergies   Review of Systems  Constitutional: Negative for fatigue.  Eyes: Negative for visual disturbance.  Respiratory: Negative for cough, chest tightness and shortness of breath.   Cardiovascular: Negative for chest pain, palpitations and leg swelling.  Gastrointestinal: Negative for abdominal pain.  Endocrine: Negative for polydipsia and polyuria.  Neurological: Negative for dizziness, syncope, weakness, light-headedness and headaches.       Objective:   Physical Exam  Constitutional: He is oriented to person, place, and time. He appears well-developed and well-nourished.  HENT:  Right Ear: External ear normal.  Left Ear: External ear normal.  Mouth/Throat: Oropharynx is clear and moist.  Eyes: Pupils are equal, round, and reactive to light.  Neck: Neck supple.  No thyromegaly present.  Cardiovascular: Normal rate and regular rhythm.  Pulmonary/Chest: Effort normal and breath sounds normal. No respiratory distress. He has no wheezes. He has no rales.  Musculoskeletal: He exhibits no edema.  Neurological: He is alert and oriented to person, place, and time.       Assessment:     #1 history of prediabetes. A1c stable at 6.3%  #2 hypertension stable and at goal  #3 dyslipidemia    Plan:     -Obtain further labs with hepatitis C antibody, lipid panel -Flu vaccine already given -Plan routine follow-up in 6 months and sooner as needed  Eulas Post MD [facility] at [facility]

## 2017-10-21 LAB — HEPATITIS C ANTIBODY
HEP C AB: NONREACTIVE
SIGNAL TO CUT-OFF: 0.01 (ref <1.00)

## 2017-12-08 ENCOUNTER — Encounter: Payer: Self-pay | Admitting: Family Medicine

## 2017-12-08 ENCOUNTER — Ambulatory Visit (INDEPENDENT_AMBULATORY_CARE_PROVIDER_SITE_OTHER): Payer: Medicare Other | Admitting: Family Medicine

## 2017-12-08 VITALS — BP 120/76 | HR 79 | Temp 98.0°F | Ht 75.0 in | Wt 261.1 lb

## 2017-12-08 DIAGNOSIS — J209 Acute bronchitis, unspecified: Secondary | ICD-10-CM | POA: Diagnosis not present

## 2017-12-08 NOTE — Patient Instructions (Signed)

## 2017-12-08 NOTE — Progress Notes (Signed)
Subjective:     Patient ID: Paul Townsend, male   DOB: [birth year], 72 y.o.   MRN: [id number]  HPI Patient seen with 3 day history of nonproductive cough. No fever. He's had some chest wall pain when coughing. No pleuritic pain. He's taken some NyQuil and Advil which seemed to help. Denies any fever or chills. Nonsmoker.  Past Medical History:  Diagnosis Date  . Arthritis   . Hyperlipidemia   . Hypertension    Past Surgical History:  Procedure Laterality Date  . ORIF Rt femur Right 01/05/2014  . TONSILLECTOMY    . TOTAL KNEE ARTHROPLASTY    . VARICOSE VEIN SURGERY      reports that he has quit smoking. he has never used smokeless tobacco. He reports that he drinks alcohol. His drug history is not on file. family history includes Arthritis in his father; Cancer in his mother; Heart disease (age of onset: 32) in his father; Hypertension in his father. No Known Allergies   Review of Systems  Constitutional: Positive for fatigue. Negative for chills and fever.  HENT: Positive for congestion.   Respiratory: Positive for cough. Negative for shortness of breath and wheezing.        Objective:   Physical Exam  Constitutional: He appears well-developed and well-nourished.  HENT:  Right Ear: External ear normal.  Left Ear: External ear normal.  Mouth/Throat: Oropharynx is clear and moist.  Neck: Neck supple.  Cardiovascular: Normal rate and regular rhythm.  Pulmonary/Chest: Effort normal and breath sounds normal. No respiratory distress. He has no wheezes. He has no rales.  Lymphadenopathy:    He has no cervical adenopathy.       Assessment:     Cough. Suspect acute viral bronchitis    Plan:     -Continue over-the-counter medications as needed. Follow-up promptly for any fever or other changes symptom  Eulas Post MD [facility] at [facility]

## 2018-03-09 ENCOUNTER — Other Ambulatory Visit: Payer: Self-pay | Admitting: Family Medicine

## 2018-03-16 ENCOUNTER — Other Ambulatory Visit: Payer: Self-pay | Admitting: *Deleted

## 2018-03-16 MED ORDER — LISINOPRIL 10 MG PO TABS: 10.0000 mg | ORAL_TABLET | Freq: Every day | ORAL | 2 refills | Status: DC

## 2018-03-20 ENCOUNTER — Other Ambulatory Visit: Payer: Self-pay | Admitting: Family Medicine

## 2018-04-10 DIAGNOSIS — Z96653 Presence of artificial knee joint, bilateral: Secondary | ICD-10-CM | POA: Diagnosis not present

## 2018-04-10 DIAGNOSIS — Z471 Aftercare following joint replacement surgery: Secondary | ICD-10-CM | POA: Diagnosis not present

## 2018-04-10 DIAGNOSIS — Z96651 Presence of right artificial knee joint: Secondary | ICD-10-CM | POA: Diagnosis not present

## 2018-04-10 DIAGNOSIS — Z96652 Presence of left artificial knee joint: Secondary | ICD-10-CM | POA: Diagnosis not present

## 2018-07-30 ENCOUNTER — Ambulatory Visit (INDEPENDENT_AMBULATORY_CARE_PROVIDER_SITE_OTHER): Payer: Medicare Other | Admitting: *Deleted

## 2018-07-30 DIAGNOSIS — Z23 Encounter for immunization: Secondary | ICD-10-CM | POA: Diagnosis not present

## 2018-07-30 NOTE — Progress Notes (Signed)
Per orders of Dr. Elease Hashimoto, injection of influenza vaccine given by Agnes Lawrence. Patient tolerated injection well.

## 2018-07-31 ENCOUNTER — Other Ambulatory Visit: Payer: Self-pay

## 2018-07-31 ENCOUNTER — Encounter: Payer: Self-pay | Admitting: Family Medicine

## 2018-07-31 ENCOUNTER — Ambulatory Visit (INDEPENDENT_AMBULATORY_CARE_PROVIDER_SITE_OTHER): Payer: Medicare Other | Admitting: Family Medicine

## 2018-07-31 VITALS — BP 136/76 | HR 82 | Temp 98.3°F | Ht 75.0 in | Wt 245.9 lb

## 2018-07-31 DIAGNOSIS — R202 Paresthesia of skin: Secondary | ICD-10-CM

## 2018-07-31 DIAGNOSIS — R7303 Prediabetes: Secondary | ICD-10-CM

## 2018-07-31 DIAGNOSIS — R42 Dizziness and giddiness: Secondary | ICD-10-CM | POA: Diagnosis not present

## 2018-07-31 DIAGNOSIS — R970 Elevated carcinoembryonic antigen [CEA]: Secondary | ICD-10-CM | POA: Diagnosis not present

## 2018-07-31 NOTE — Progress Notes (Signed)
Subjective:     Patient ID: Paul Townsend, male   DOB: 01/08/1946, 71 y.o.   MRN: [id number]  HPI Here to discuss multiple recent issues  Martin Majestic to apply for additional life insurance.  He had multiple lab work that he would like to review.  Labs were significant for nonfasting glucose 135, hemoglobin A1c 6.1%, mildly elevated triglycerides of 212.  For some reason, the company he was applying to did a CEA level which was 3.0.  He had colonoscopy about 3 years ago with no polyps.  He has no history of cancer whatsoever.  He has some episodes of lightheadedness and dizziness sometimes when going from sitting to standing.  He drinks mostly coffee and sodas.  Very little water.  He realizes this is probably some dehydration.  No syncope.  No chest pains.  He has been losing some weight due to his efforts.  He attributes this to reducing sugars and starches and exercising more  He had some tingling sensation and mostly the great toe bilaterally over the past few months.  He wondered if this was blood sugar related.  He also started himself on over-the-counter B12 supplement.  No significant pain.  No weakness.  Past Medical History:  Diagnosis Date  . Arthritis   . Hyperlipidemia   . Hypertension    Past Surgical History:  Procedure Laterality Date  . ORIF Rt femur Right 01/05/2014  . TONSILLECTOMY    . TOTAL KNEE ARTHROPLASTY    . VARICOSE VEIN SURGERY      reports that he has quit smoking. He has never used smokeless tobacco. He reports that he drinks alcohol. His drug history is not on file. family history includes Arthritis in his father; Cancer in his mother; Heart disease (age of onset: 24) in his father; Hypertension in his father. No Known Allergies   Review of Systems  Constitutional: Negative for fatigue.  Eyes: Negative for visual disturbance.  Respiratory: Negative for cough, chest tightness and shortness of breath.   Cardiovascular: Negative for chest pain, palpitations  and leg swelling.  Neurological: Positive for dizziness, light-headedness and numbness. Negative for syncope, speech difficulty, weakness and headaches.       Objective:   Physical Exam  Constitutional: He is oriented to person, place, and time. He appears well-developed and well-nourished.  HENT:  Right Ear: External ear normal.  Left Ear: External ear normal.  Mouth/Throat: Oropharynx is clear and moist.  Eyes: Pupils are equal, round, and reactive to light.  Neck: Neck supple. No thyromegaly present.  Cardiovascular: Normal rate and regular rhythm.  Good distal pulses in both feet  Pulmonary/Chest: Effort normal and breath sounds normal. No respiratory distress. He has no wheezes. He has no rales.  Musculoskeletal: He exhibits no edema.  Neurological: He is alert and oriented to person, place, and time.  Monofilament testing is normal throughout both feet.       Assessment:     #1 intermittent lightheadedness.  Probably related to his recent weight loss and lower blood pressure.  ?mild dehydration.  Standing blood pressure 122/70 which is minimal change from sitting at this time  #2 recent CEA level of 3.0 per life insurance labs.  Uncertain significance.  No prior history of malignancy and no worrisome symptoms on exam.  We explained it is not standard to use CEA for screening purposes and not sure why life insurance company ordered this.  #3 history of hyperglycemia/prediabetes.  A1c recently 6.1%.  He had A1c of  6.3% last year here    Plan:     -Continue with regular exercise and weight control efforts -We explained that CEA is a very nonspecific test and is generally not recommended for screening but more monitoring purposes.  We explained that there are multiple nonspecific causes for elevation and that even though this can be associated with various malignancies such as thyroid, breast, pancreas, or lung can also be associated with many nonmalignant nonspecific  conditions. -He has physical in a couple months.  Will consider repeat CEA level then -Consider B12, TSH, repeat A1c, and serum protein electrophoresis to evaluate his paresthesias if still present at follow-up for his physical -Drink more fluids-especially water -Consider discontinue HCTZ if lightheadedness persist  Eulas Post MD [facility] at [facility]

## 2018-07-31 NOTE — Patient Instructions (Signed)
CEA Carcinoembryonic antigen (CEA) is a protein that is elevated in your blood if you have some types of cancer. This protein is normally found in babies, but it decreases after birth. Adults typically have a very low level of CEA. CEA is measured with a blood test. It requires a blood sample from a vein in your arm or hand. Your health care provider may order this blood test if you have been diagnosed with cancer. This test can be used to monitor your cancer before, during, and after treatment. Conditions that can be monitored with CEA include cancer of the:  Colon.  Breast.  Lungs.  Ovaries.  Pancreas.  Stomach.  Bladder.  Thyroid.  Liver.  CEA is not used to screen for cancer because certain noncancerous conditions can also increase CEA. These include breast, lung, and digestive system diseases. How do I prepare for this test? Let your health care provider know if you use any tobacco products, including cigarettes, chewing tobacco, or electronic cigarettes, as tobacco can make CEA go up. Your health care provider may ask you to stop using tobacco for a few days before the test. If you need help quitting, ask your health care provider. What do the results mean? It is your responsibility to obtain your test results. Ask the lab or department performing the test when and how you will get your results. Contact your health care provider to discuss any questions you have about your results. Range of Normal Values Ranges for normal values vary among different labs and hospitals. You should always check with your health care provider after having lab work or other tests done to discuss whether your values are considered within normal limits. The normal range for CEA is 0-2.5 micrograms per liter (mcg/L). If you use tobacco, a normal range can be up to 5 mcg/L. Higher levels are in the positive range. The higher the level, the more significant the result is. Meaning of Results Outside Normal  Range Values If you have cancer, a high CEA level may mean that your cancer:  Is advanced.  Is not responding to treatment.  Has come back after treatment.  Other conditions may also cause a high CEA level. These include:  Heavy tobacco use.  Liver disease.  Certain bowel diseases.  Inflammation of your pancreas.  Lung disease or infection.  Stomach ulcer.  Breast disease that is not cancer.  Ovarian disease that is not cancer.  Talk with your health care provider to discuss your results, treatment options, and if necessary, the need for more tests. Talk with your health care provider if you have any questions about your results. This information is not intended to replace advice given to you by your health care provider. Make sure you discuss any questions you have with your health care provider. Document Released: 11/17/2004 Document Revised: 06/28/2016 Document Reviewed: 01/22/2014 Elsevier Interactive Patient Education  [employer].

## 2018-08-01 DIAGNOSIS — H35372 Puckering of macula, left eye: Secondary | ICD-10-CM | POA: Diagnosis not present

## 2018-08-01 DIAGNOSIS — H52203 Unspecified astigmatism, bilateral: Secondary | ICD-10-CM | POA: Diagnosis not present

## 2018-08-01 DIAGNOSIS — Z961 Presence of intraocular lens: Secondary | ICD-10-CM | POA: Diagnosis not present

## 2018-09-07 IMAGING — DX DG ABDOMEN 2V
3 series · 3 of 3 positions shown · non-contrast
Comparison: None in PACs

CLINICAL DATA: Constipation and bloating for the past 2 days

EXAM:
ABDOMEN - 2 VIEW

[abdomen erect]
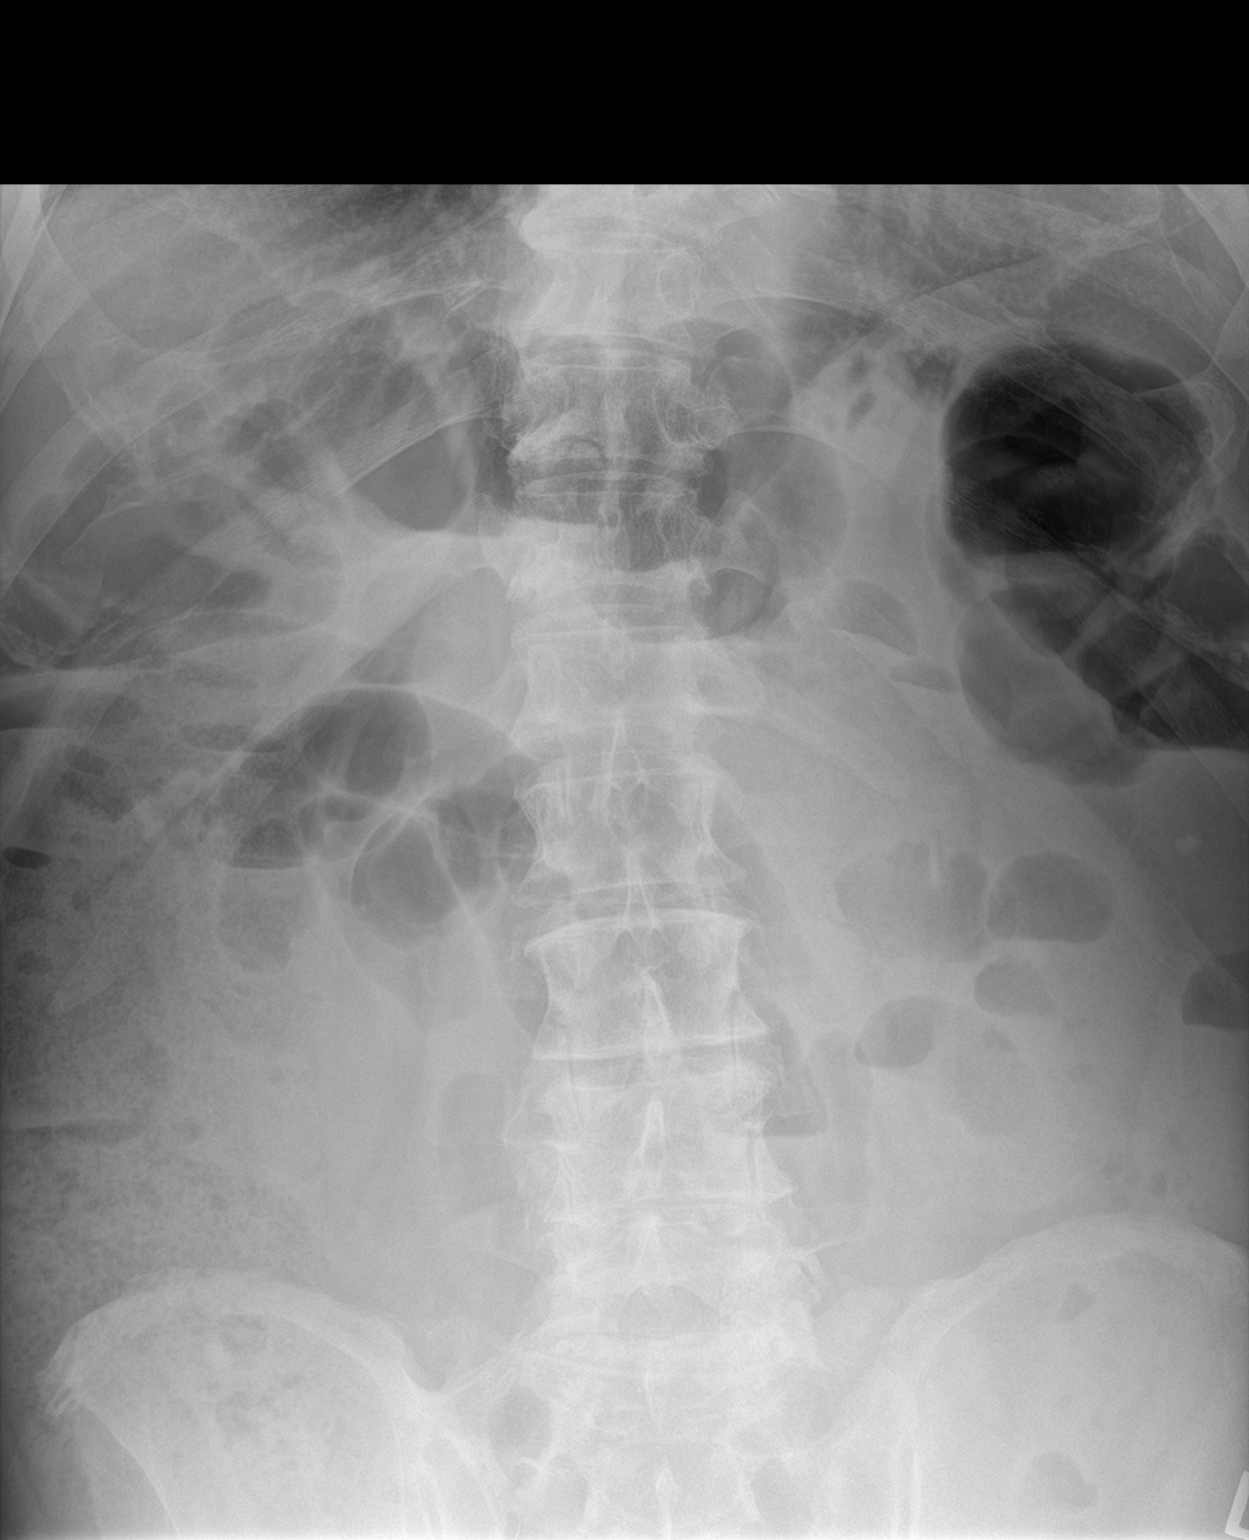

[abdomen supine (1 of 2)]
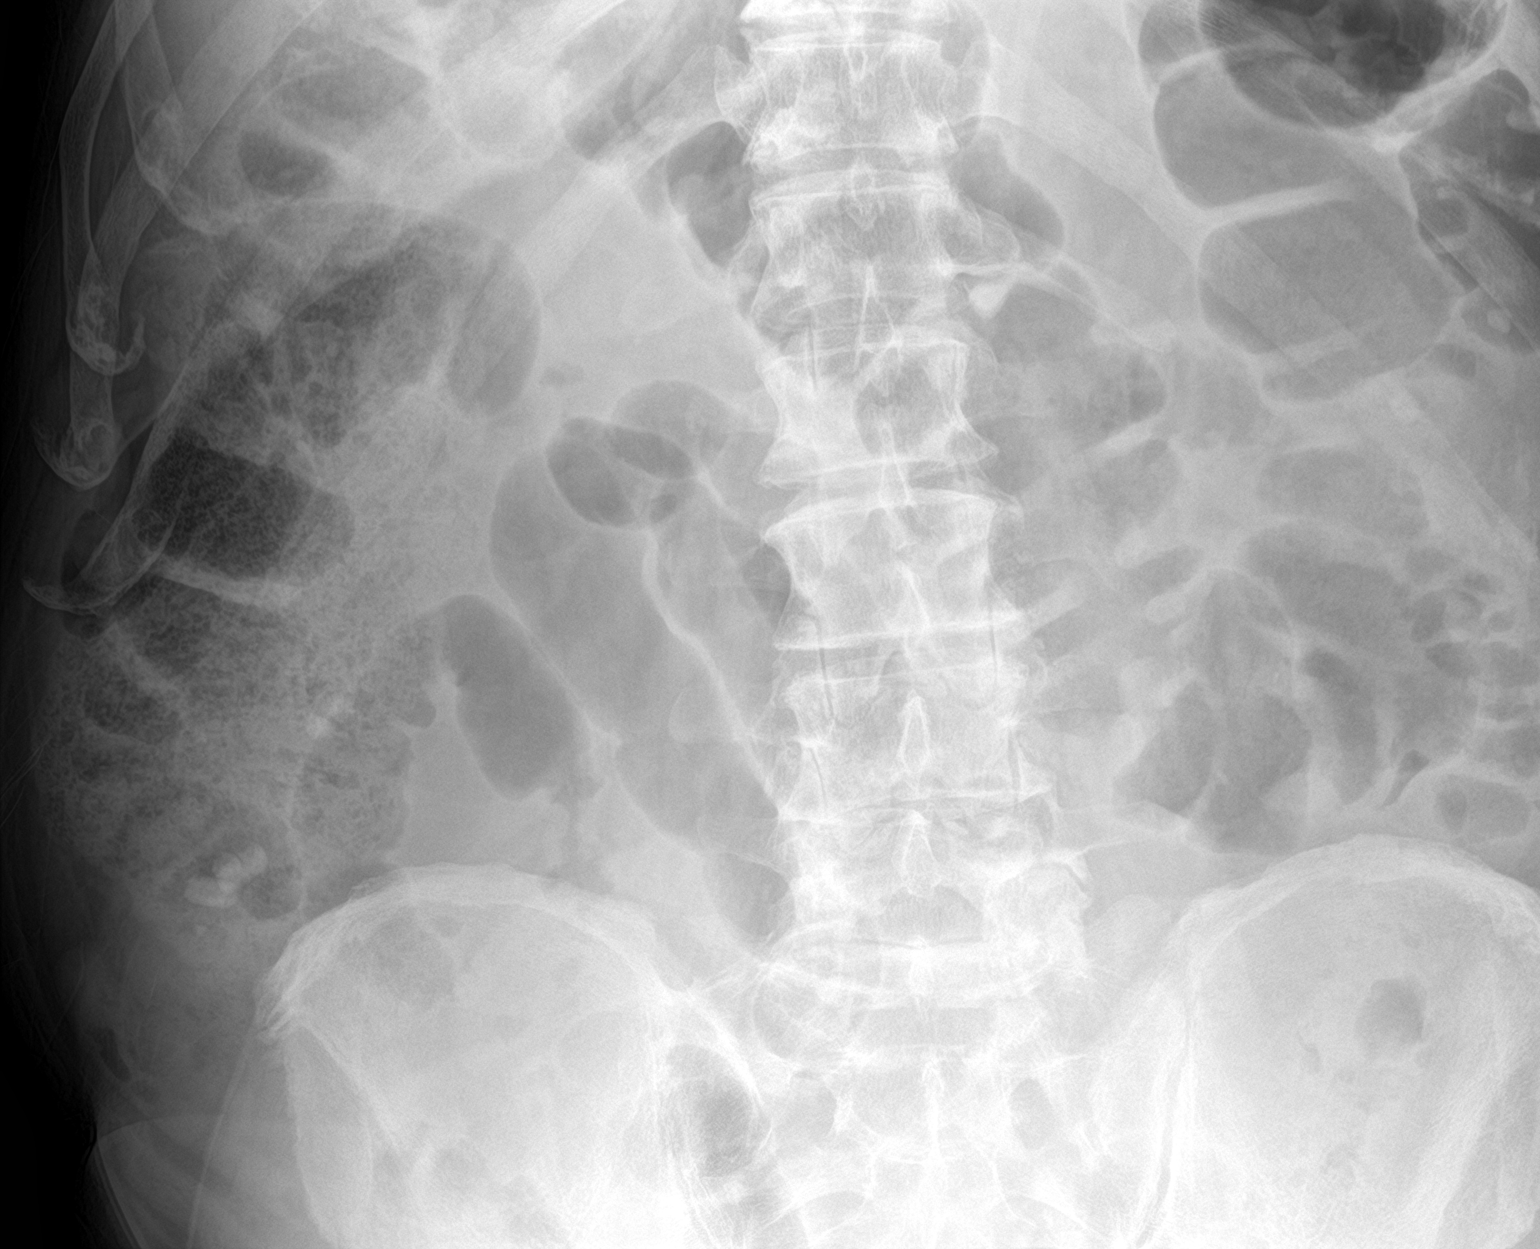

[abdomen supine (2 of 2)]
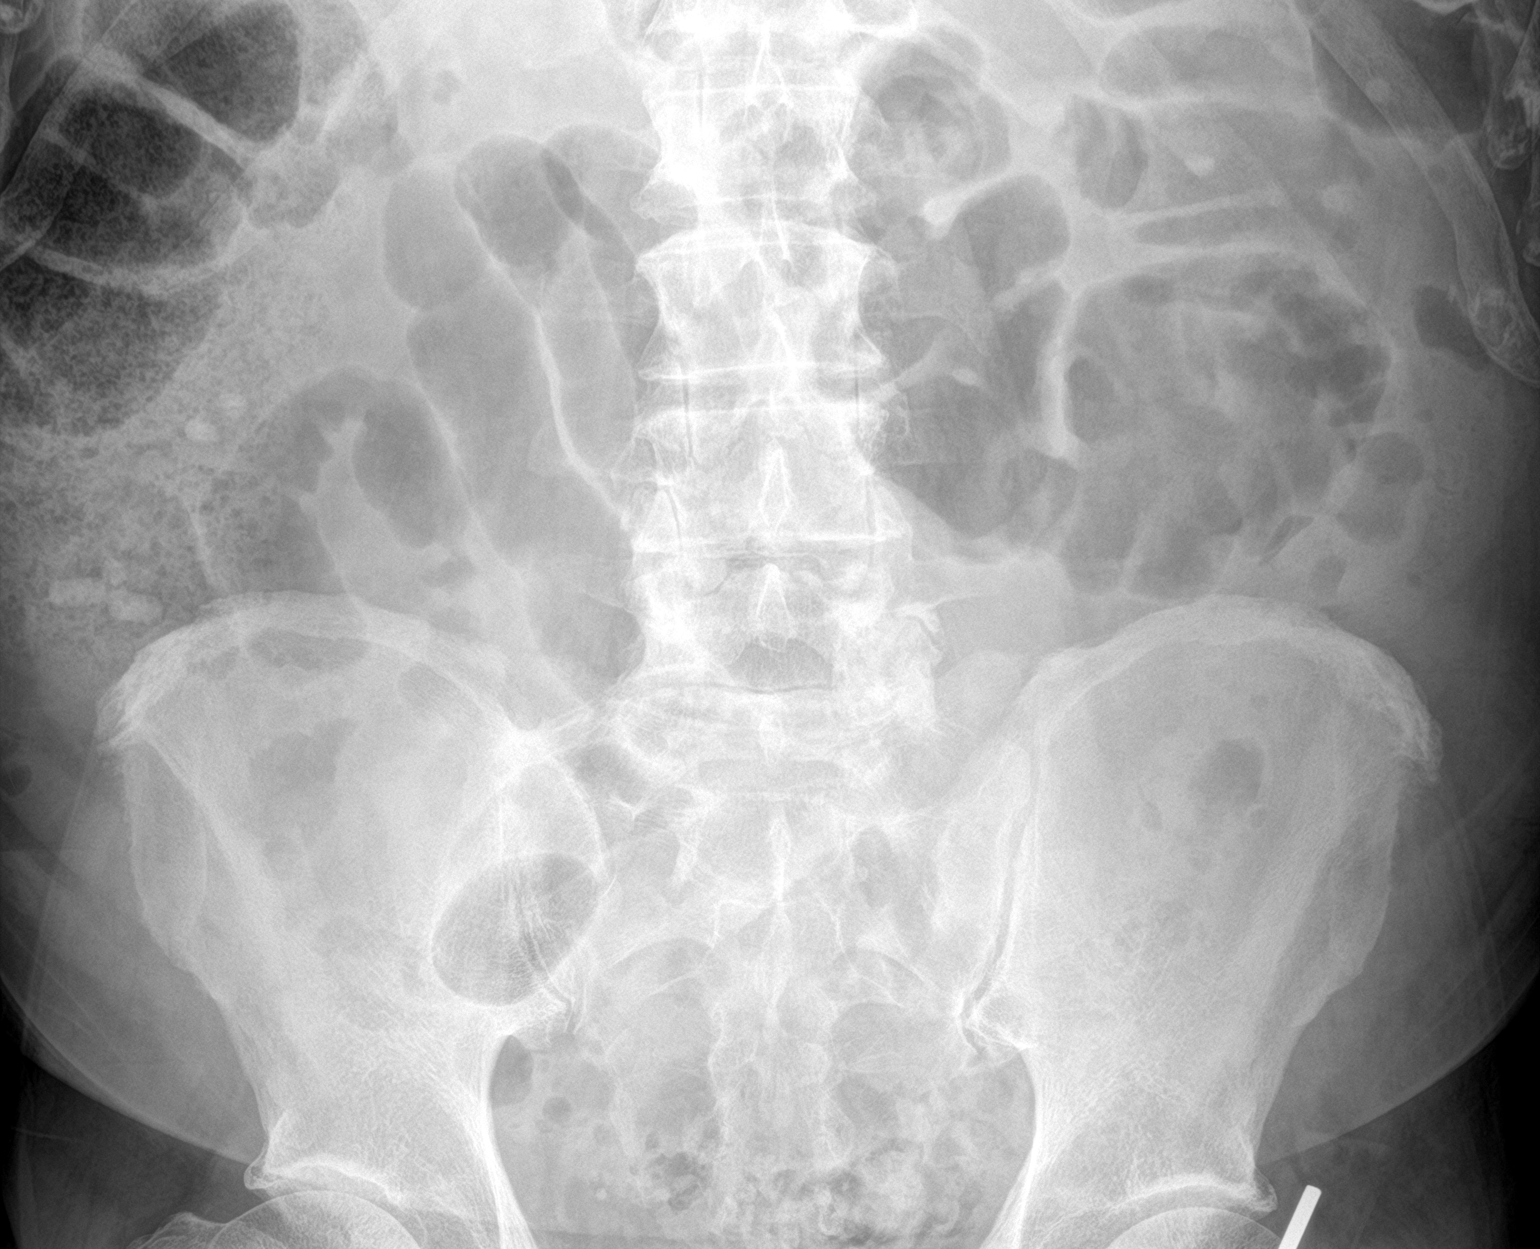

[3 of 3 positions shown; findings below may reference images not displayed]

FINDINGS: There is a moderate amount of gas within mildly distended small
bowel loops. There is gas in the transverse portion of the: . There
is considerable stool in the right colon with a moderate amount of
stool in the descending colon and rectosigmoid. No free extraluminal
gas collections are observed. There are no abnormal soft tissue
calcifications. There are degenerative changes of the lower lumbar
spine.
IMPRESSION: Moderately increase colonic stool burden consistent with
constipation. Moderate volume of gas within small and large bowel
loops without evidence of obstruction.

## 2018-09-16 ENCOUNTER — Other Ambulatory Visit: Payer: Self-pay | Admitting: Family Medicine

## 2018-09-20 DIAGNOSIS — Z800 Family history of malignant neoplasm of digestive organs: Secondary | ICD-10-CM | POA: Diagnosis not present

## 2018-09-20 DIAGNOSIS — Z1211 Encounter for screening for malignant neoplasm of colon: Secondary | ICD-10-CM | POA: Diagnosis not present

## 2018-09-20 DIAGNOSIS — K219 Gastro-esophageal reflux disease without esophagitis: Secondary | ICD-10-CM | POA: Diagnosis not present

## 2018-09-24 ENCOUNTER — Encounter: Payer: Self-pay | Admitting: Family Medicine

## 2018-09-24 ENCOUNTER — Other Ambulatory Visit: Payer: Self-pay

## 2018-09-24 ENCOUNTER — Ambulatory Visit (INDEPENDENT_AMBULATORY_CARE_PROVIDER_SITE_OTHER): Payer: Medicare Other | Admitting: Family Medicine

## 2018-09-24 ENCOUNTER — Ambulatory Visit: Payer: Self-pay

## 2018-09-24 VITALS — BP 134/76 | HR 94 | Temp 98.9°F | Wt 249.6 lb

## 2018-09-24 DIAGNOSIS — R1011 Right upper quadrant pain: Secondary | ICD-10-CM | POA: Diagnosis not present

## 2018-09-24 DIAGNOSIS — R110 Nausea: Secondary | ICD-10-CM

## 2018-09-24 NOTE — Patient Instructions (Signed)
We will be setting up abdominal ultrasound to further assess  Eat bland (low fat) diet for now.  Follow up for any fever, recurrent vomiting, or progressive pain.

## 2018-09-24 NOTE — Telephone Encounter (Signed)
Pt. Reports he drank a lot of orange juice over the weekend and began to notice bloating, abdominal pain. Pain is at the belt line and more to the right - radiates around to his back.Pain is constant and "is a little better." Denies any constipation or diarrhea. Instructed to eat light today.Appointment made for today, per agent.Instructed if symptoms worsen, to call back or go to ED.Verbalizes understanding.  Reason for Disposition . [1] MODERATE pain (e.g., interferes with normal activities) AND [2] pain comes and goes (cramps) AND [3] present > 24 hours  (Exception: pain with Vomiting or Diarrhea - see that Guideline)  Answer Assessment - Initial Assessment Questions 1. LOCATION: "Where does it hurt?"      Hurts at the belt line more on the right 2. RADIATION: "Does the pain shoot anywhere else?" (e.g., chest, back)     Hurts around to his back 3. ONSET: "When did the pain begin?" (Minutes, hours or days ago)      Started Saturday 4. SUDDEN: "Gradual or sudden onset?"     Gradual 5. PATTERN "Does the pain come and go, or is it constant?"    - If constant: "Is it getting better, staying the same, or worsening?"      (Note: Constant means the pain never goes away completely; most serious pain is constant and it progresses)     - If intermittent: "How long does it last?" "Do you have pain now?"     (Note: Intermittent means the pain goes away completely between bouts)      Constant 6. SEVERITY: "How bad is the pain?"  (e.g., Scale 1-10; mild, moderate, or severe)    - MILD (1-3): doesn't interfere with normal activities, abdomen soft and not tender to touch     - MODERATE (4-7): interferes with normal activities or awakens from sleep, tender to touch     - SEVERE (8-10): excruciating pain, doubled over, unable to do any normal activities       4-5 7. RECURRENT SYMPTOM: "Have you ever had this type of abdominal pain before?" If so, ask: "When was the last time?" and "What happened that time?"       No 8. CAUSE: "What do you think is causing the abdominal pain?"     Unsure 9. RELIEVING/AGGRAVATING FACTORS: "What makes it better or worse?" (e.g., movement, antacids, bowel movement)     Antacid, Gas X 10. OTHER SYMPTOMS: "Has there been any vomiting, diarrhea, constipation, or urine problems?"       No  Protocols used: ABDOMINAL PAIN - MALE-A-AH

## 2018-09-24 NOTE — Progress Notes (Signed)
  Subjective:     Patient ID: Paul Townsend, male   DOB: [birth year], 72 y.o.   MRN: [id number]  HPI Patient seen with 1 day history of abdominal pain right upper quadrant with radiation toward the back.  He states that he had not had bowel movement for a couple days and drank about a quart of orange juice and water initially and thought that may have irritated his system.  He has had somewhat nonspecific bloating.  He had some nausea but no vomiting.  He has a dull achy pain right upper quadrant which is somewhat intermittent and radiates to the back.  Symptoms were somewhat worse last night.  No fevers or chills.  No pleuritic pain.  No cough.  No chest pain.  No history of gallstones.  Decreased appetite today.  Very little intake today of food.  Past Medical History:  Diagnosis Date  . Arthritis   . Hyperlipidemia   . Hypertension    Past Surgical History:  Procedure Laterality Date  . ORIF Rt femur Right 01/05/2014  . TONSILLECTOMY    . TOTAL KNEE ARTHROPLASTY    . VARICOSE VEIN SURGERY      reports that he has quit smoking. He has never used smokeless tobacco. He reports that he drinks alcohol. His drug history is not on file. family history includes Arthritis in his father; Cancer in his mother; Heart disease (age of onset: 103) in his father; Hypertension in his father. No Known Allergies   Review of Systems  Constitutional: Negative for chills and fever.  Respiratory: Negative for cough and shortness of breath.   Cardiovascular: Negative for chest pain.  Gastrointestinal: Positive for abdominal pain and nausea. Negative for blood in stool, diarrhea and vomiting.  Genitourinary: Negative for dysuria.       Objective:   Physical Exam  Constitutional: He appears well-developed and well-nourished.  Cardiovascular: Normal rate and regular rhythm.  Pulmonary/Chest: Effort normal and breath sounds normal.  Abdominal: Soft. Bowel sounds are normal. He exhibits no distension  and no mass. There is no rebound and no guarding.  He has some tenderness right upper quadrant to deep palpation.  No guarding or rebound.  No hepatomegaly noted.       Assessment:     Presents with couple day history of intermittent abdominal pain right upper quadrant with radiation posteriorly.  Nonacute abdomen.  Rule out symptomatic cholecystitis/cholelithiasis    Plan:     -Check labs with CBC, comprehensive metabolic panel, lipase -Set up abdominal ultrasound to further assess- will try to get as soon as possible. -Bland diet until further evaluated -Follow-up immediately for any fever, recurrent vomiting, or any progressive pain  Eulas Post MD [facility]

## 2018-09-25 ENCOUNTER — Telehealth: Payer: Self-pay

## 2018-09-25 ENCOUNTER — Emergency Department (HOSPITAL_COMMUNITY): Payer: Medicare Other

## 2018-09-25 ENCOUNTER — Emergency Department (HOSPITAL_COMMUNITY)
Admission: EM | Admit: 2018-09-25 | Discharge: 2018-09-25 | Disposition: A | Discharge status: Home / Self Care | Payer: Medicare Other | Attending: Emergency Medicine | Admitting: Emergency Medicine

## 2018-09-25 ENCOUNTER — Encounter (HOSPITAL_COMMUNITY): Payer: Self-pay

## 2018-09-25 ENCOUNTER — Other Ambulatory Visit: Payer: Self-pay

## 2018-09-25 DIAGNOSIS — I2510 Atherosclerotic heart disease of native coronary artery without angina pectoris: Secondary | ICD-10-CM

## 2018-09-25 DIAGNOSIS — R1011 Right upper quadrant pain: Secondary | ICD-10-CM | POA: Diagnosis not present

## 2018-09-25 DIAGNOSIS — I10 Essential (primary) hypertension: Secondary | ICD-10-CM | POA: Diagnosis not present

## 2018-09-25 DIAGNOSIS — Z87891 Personal history of nicotine dependence: Secondary | ICD-10-CM | POA: Diagnosis not present

## 2018-09-25 DIAGNOSIS — Z96659 Presence of unspecified artificial knee joint: Secondary | ICD-10-CM | POA: Insufficient documentation

## 2018-09-25 DIAGNOSIS — Z7982 Long term (current) use of aspirin: Secondary | ICD-10-CM | POA: Insufficient documentation

## 2018-09-25 DIAGNOSIS — I2584 Coronary atherosclerosis due to calcified coronary lesion: Secondary | ICD-10-CM

## 2018-09-25 DIAGNOSIS — Z79899 Other long term (current) drug therapy: Secondary | ICD-10-CM | POA: Insufficient documentation

## 2018-09-25 DIAGNOSIS — K859 Acute pancreatitis without necrosis or infection, unspecified: Secondary | ICD-10-CM | POA: Insufficient documentation

## 2018-09-25 DIAGNOSIS — I712 Thoracic aortic aneurysm, without rupture, unspecified: Secondary | ICD-10-CM

## 2018-09-25 DIAGNOSIS — N281 Cyst of kidney, acquired: Secondary | ICD-10-CM | POA: Diagnosis not present

## 2018-09-25 LAB — URINALYSIS, ROUTINE W REFLEX MICROSCOPIC
BACTERIA UA: NONE SEEN
Bilirubin Urine: NEGATIVE
Glucose, UA: NEGATIVE mg/dL
Hgb urine dipstick: NEGATIVE
Ketones, ur: 5 mg/dL — AB
LEUKOCYTES UA: NEGATIVE
Nitrite: NEGATIVE
PH: 6 (ref 5.0–8.0)
Protein, ur: 100 mg/dL — AB
SPECIFIC GRAVITY, URINE: 1.015 (ref 1.005–1.030)

## 2018-09-25 LAB — CBC WITH DIFFERENTIAL/PLATELET
Basophils Absolute: 0.1 10*3/uL (ref 0.0–0.1)
Basophils Relative: 0.5 % (ref 0.0–3.0)
EOS ABS: 0 10*3/uL (ref 0.0–0.7)
Eosinophils Relative: 0 % (ref 0.0–5.0)
HCT: 51.1 % (ref 39.0–52.0)
HEMOGLOBIN: 17.3 g/dL — AB (ref 13.0–17.0)
Lymphocytes Relative: 3.5 % — ABNORMAL LOW (ref 12.0–46.0)
Lymphs Abs: 0.7 10*3/uL (ref 0.7–4.0)
MCHC: 33.8 g/dL (ref 30.0–36.0)
MCV: 95 fl (ref 78.0–100.0)
MONO ABS: 1.9 10*3/uL — AB (ref 0.1–1.0)
Monocytes Relative: 9.4 % (ref 3.0–12.0)
Neutro Abs: 17.5 10*3/uL — ABNORMAL HIGH (ref 1.4–7.7)
Neutrophils Relative %: 86.6 % — ABNORMAL HIGH (ref 43.0–77.0)
Platelets: 238 10*3/uL (ref 150.0–400.0)
RBC: 5.38 Mil/uL (ref 4.22–5.81)
RDW: 13.5 % (ref 11.5–15.5)

## 2018-09-25 LAB — COMPREHENSIVE METABOLIC PANEL
ALBUMIN: 4.2 g/dL (ref 3.5–5.0)
ALK PHOS: 58 U/L (ref 38–126)
ALT: 16 U/L (ref 0–53)
ALT: 19 U/L (ref 0–44)
ANION GAP: 12 (ref 5–15)
AST: 15 U/L (ref 0–37)
AST: 23 U/L (ref 15–41)
Albumin: 4.6 g/dL (ref 3.5–5.2)
Alkaline Phosphatase: 61 U/L (ref 39–117)
BILIRUBIN TOTAL: 2.1 mg/dL — AB (ref 0.3–1.2)
BUN: 15 mg/dL (ref 6–23)
BUN: 19 mg/dL (ref 8–23)
CHLORIDE: 96 meq/L (ref 96–112)
CHLORIDE: 97 mmol/L — AB (ref 98–111)
CO2: 28 meq/L (ref 19–32)
CO2: 26 mmol/L (ref 22–32)
Calcium: 9.7 mg/dL (ref 8.4–10.5)
Calcium: 9 mg/dL (ref 8.9–10.3)
Creatinine, Ser: 0.91 mg/dL (ref 0.40–1.50)
Creatinine, Ser: 1.04 mg/dL (ref 0.61–1.24)
GFR calc Af Amer: >60 mL/min (ref >60)
GFR calc non Af Amer: >60 mL/min (ref >60)
GFR: 86.7 mL/min (ref >60.00)
Glucose, Bld: 141 mg/dL — ABNORMAL HIGH (ref 70–99)
Glucose, Bld: 146 mg/dL — ABNORMAL HIGH (ref 70–99)
POTASSIUM: 3.8 mmol/L (ref 3.5–5.1)
Potassium: 4.1 mEq/L (ref 3.5–5.1)
SODIUM: 137 meq/L (ref 135–145)
Sodium: 135 mmol/L (ref 135–145)
Total Bilirubin: 1.5 mg/dL — ABNORMAL HIGH (ref 0.2–1.2)
Total Protein: 7.6 g/dL (ref 6.0–8.3)
Total Protein: 8 g/dL (ref 6.5–8.1)

## 2018-09-25 LAB — CBC
HEMATOCRIT: 50.4 % (ref 39.0–52.0)
Hemoglobin: 16.8 g/dL (ref 13.0–17.0)
MCH: 31.7 pg (ref 26.0–34.0)
MCHC: 33.3 g/dL (ref 30.0–36.0)
MCV: 95.1 fL (ref 80.0–100.0)
NRBC: 0 % (ref 0.0–0.2)
Platelets: 214 10*3/uL (ref 150–400)
RBC: 5.3 MIL/uL (ref 4.22–5.81)
RDW: 13.1 % (ref 11.5–15.5)
WBC: 22.3 10*3/uL — ABNORMAL HIGH (ref 4.0–10.5)

## 2018-09-25 LAB — LIPASE, BLOOD: Lipase: 52 U/L — ABNORMAL HIGH (ref 11–51)

## 2018-09-25 LAB — LIPASE: Lipase: 189 U/L — ABNORMAL HIGH (ref 11.0–59.0)

## 2018-09-25 MED ORDER — HYDROMORPHONE HCL 1 MG/ML IJ SOLN
0.5000 mg | Freq: Once | INTRAMUSCULAR | Status: AC
  Administered 2018-09-25: 0.5 mg via INTRAVENOUS
  Filled 2018-09-25: qty 1

## 2018-09-25 MED ORDER — PANTOPRAZOLE SODIUM 40 MG PO TBEC: 40.0000 mg | DELAYED_RELEASE_TABLET | Freq: Every day | ORAL | 0 refills | Status: DC

## 2018-09-25 MED ORDER — TRAMADOL HCL 50 MG PO TABS: 50.0000 mg | ORAL_TABLET | Freq: Four times a day (QID) | ORAL | 0 refills | Status: DC | PRN

## 2018-09-25 MED ORDER — IOPAMIDOL (ISOVUE-300) INJECTION 61%
100.0000 mL | Freq: Once | INTRAVENOUS | Status: AC | PRN
  Administered 2018-09-25: 100 mL via INTRAVENOUS

## 2018-09-25 MED ORDER — ONDANSETRON HCL 4 MG/2ML IJ SOLN
4.0000 mg | Freq: Once | INTRAMUSCULAR | Status: AC
  Administered 2018-09-25: 4 mg via INTRAVENOUS
  Filled 2018-09-25: qty 2

## 2018-09-25 MED ORDER — IOPAMIDOL (ISOVUE-300) INJECTION 61%
INTRAVENOUS | Status: AC
  Filled 2018-09-25: qty 100

## 2018-09-25 MED ORDER — SODIUM CHLORIDE (PF) 0.9 % IJ SOLN
INTRAMUSCULAR | Status: AC
  Filled 2018-09-25: qty 50

## 2018-09-25 MED ORDER — SODIUM CHLORIDE 0.9 % IV BOLUS
1000.0000 mL | Freq: Once | INTRAVENOUS | Status: AC
  Administered 2018-09-25: 1000 mL via INTRAVENOUS

## 2018-09-25 NOTE — ED Notes (Signed)
As I write this, he is undergoing his abd. U/s.

## 2018-09-25 NOTE — ED Provider Notes (Signed)
[facility] DEPT Provider Note   CSN: [id number] Arrival date & time: 09/25/18  1007     History   Chief Complaint Chief Complaint  Patient presents with  . Abdominal Pain    HPI Paul Townsend is a 72 y.o. male.  Patient c/o ruq pain for past 2 days. Pain constant, moderate-sev, persistent, radiates to back. States similar pain in past, but milder, briefer, and occasionally worse after eating fatty foods. Nausea. No vomiting. No abd distension. No prior abd surgery. Denies hx pud, pancreatitis or gallstones. No chest pain or discomfort. No sob. No fever or chills.  The history is provided by the patient.  Abdominal Pain   Pertinent negatives include fever, dysuria and headaches.    Past Medical History:  Diagnosis Date  . Arthritis   . Hyperlipidemia   . Hypertension     Patient Active Problem List   Diagnosis Date Noted  . Prediabetes 10/17/2016  . Osteoarthritis of multiple joints 10/17/2016  . At risk for infectious disease due to recent foreign travel 05/23/2016  . Contact with and (suspected) exposure to other communicable diseases 05/23/2016  . Encounter for immunization 05/23/2016  . At high risk for infection 05/23/2016  . Travel advice encounter 05/20/2016  . Obesity (BMI 30-39.9) 08/19/2013  . Constipation 11/04/2011  . Hyperlipidemia 08/23/2010  . Essential hypertension 08/23/2010  . 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 ORIF Rt femur Right 01/05/2014  . TONSILLECTOMY    . TOTAL KNEE ARTHROPLASTY    . VARICOSE VEIN SURGERY          Home Medications    Prior to Admission medications   Medication Sig Start Date End Date Taking? Authorizing Provider  aspirin (GOODSENSE ASPIRIN) 81 MG chewable tablet Chew 81 mg by mouth.    [provider]  Calcium Citrate-Vitamin D (CALCIUM + D PO) Take by mouth.    [provider]    ezetimibe-simvastatin (VYTORIN) 10-20 MG tablet TAKE 1 TABLET AT BEDTIME 09/17/18   Burchette, Alinda Sierras, MD  hydrochlorothiazide (MICROZIDE) 12.5 MG capsule TAKE 1 CAPSULE DAILY 09/17/18   Burchette, Alinda Sierras, MD  lisinopril (PRINIVIL,ZESTRIL) 10 MG tablet Take 1 tablet (10 mg total) by mouth daily. 03/16/18   Burchette, Alinda Sierras, MD  omeprazole (PRILOSEC OTC) 20 MG tablet Take by mouth.    [provider]  sildenafil (VIAGRA) 100 MG tablet Take 1 tablet (100 mg total) by mouth daily as needed for erectile dysfunction. 10/17/16   Burchette, Alinda Sierras, MD  vitamin C (ASCORBIC ACID) 500 MG tablet Take 500 mg by mouth daily.      [provider]  Zinc Acetate, Oral, (ZINC ACETATE PO) Take by mouth.    [provider]    Family History Family History  Problem Relation Age of Onset  . Cancer Mother        colon  . Arthritis Father   . Hypertension Father   . Heart disease Father 28       MI    Social History Social History   Tobacco Use  . Smoking status: Former Research scientist (life sciences)  . Smokeless tobacco: Never Used  . Tobacco comment: Did not review today due to time limits   Substance Use Topics  . Alcohol use: Yes  . Drug use: Not on file     Allergies   Patient has no known allergies.   Review of Systems Review of  Systems  Constitutional: Negative for fever.  HENT: Negative for sore throat.   Eyes: Negative for redness.  Respiratory: Negative for shortness of breath.   Cardiovascular: Negative for chest pain.  Gastrointestinal: Positive for abdominal pain.  Endocrine: Negative for polyuria.  Genitourinary: Negative for dysuria and flank pain.  Musculoskeletal: Negative for neck pain.  Skin: Negative for rash.  Neurological: Negative for headaches.  Hematological: Does not bruise/bleed easily.  Psychiatric/Behavioral: Negative for confusion.     Physical Exam Updated Vital Signs BP 113/86 (BP Location: Right Arm)   Pulse (!) 104   Temp 98.3 F (36.8  C) (Oral)   Resp 16   Ht 1.905 m (6\' 3" )   Wt 112.9 kg   SpO2 96%   BMI 31.12 kg/m   Physical Exam  Constitutional: He appears well-developed and well-nourished.  HENT:  Mouth/Throat: Oropharynx is clear and moist.  Eyes: Conjunctivae are normal.  Neck: Neck supple. No tracheal deviation present.  Cardiovascular: Normal rate, regular rhythm, normal heart sounds and intact distal pulses. Exam reveals no gallop and no friction rub.  No murmur heard. Pulmonary/Chest: Effort normal and breath sounds normal. No accessory muscle usage. No respiratory distress.  Abdominal: Soft. Bowel sounds are normal. He exhibits no distension and no mass. There is tenderness. There is no rebound and no guarding. No hernia.  ruq tenderness. No rebound or guarding. No puls mass.   Genitourinary:  Genitourinary Comments: No cva tenderness  Musculoskeletal: He exhibits no edema.  Neurological: He is alert.  Skin: Skin is warm and dry. No rash noted.  Psychiatric: He has a normal mood and affect.  Nursing note and vitals reviewed.    ED Treatments / Results  Labs (all labs ordered are listed, but only abnormal results are displayed) Results for orders placed or performed during the hospital encounter of 09/25/18  Lipase, blood  Result Value Ref Range   Lipase 52 (H) 11 - 51 U/L  Comprehensive metabolic panel  Result Value Ref Range   Sodium 135 135 - 145 mmol/L   Potassium 3.8 3.5 - 5.1 mmol/L   Chloride 97 (L) 98 - 111 mmol/L   CO2 26 22 - 32 mmol/L   Glucose, Bld 146 (H) 70 - 99 mg/dL   BUN 19 8 - 23 mg/dL   Creatinine, Ser 1.04 0.61 - 1.24 mg/dL   Calcium 9.0 8.9 - 10.3 mg/dL   Total Protein 8.0 6.5 - 8.1 g/dL   Albumin 4.2 3.5 - 5.0 g/dL   AST 23 15 - 41 U/L   ALT 19 0 - 44 U/L   Alkaline Phosphatase 58 38 - 126 U/L   Total Bilirubin 2.1 (H) 0.3 - 1.2 mg/dL   GFR calc non Af Amer >60 >60 mL/min   GFR calc Af Amer >60 >60 mL/min   Anion gap 12 5 - 15  CBC  Result Value Ref Range    WBC 22.3 (H) 4.0 - 10.5 K/uL   RBC 5.30 4.22 - 5.81 MIL/uL   Hemoglobin 16.8 13.0 - 17.0 g/dL   HCT 50.4 39.0 - 52.0 %   MCV 95.1 80.0 - 100.0 fL   MCH 31.7 26.0 - 34.0 pg   MCHC 33.3 30.0 - 36.0 g/dL   RDW 13.1 11.5 - 15.5 %   Platelets 214 150 - 400 K/uL   nRBC 0.0 0.0 - 0.2 %  Urinalysis, Routine w reflex microscopic  Result Value Ref Range   Color, Urine YELLOW YELLOW   APPearance CLEAR  CLEAR   Specific Gravity, Urine 1.015 1.005 - 1.030   pH 6.0 5.0 - 8.0   Glucose, UA NEGATIVE NEGATIVE mg/dL   Hgb urine dipstick NEGATIVE NEGATIVE   Bilirubin Urine NEGATIVE NEGATIVE   Ketones, ur 5 (A) NEGATIVE mg/dL   Protein, ur 100 (A) NEGATIVE mg/dL   Nitrite NEGATIVE NEGATIVE   Leukocytes, UA NEGATIVE NEGATIVE   RBC / HPF 0-5 0 - 5 RBC/hpf   WBC, UA 0-5 0 - 5 WBC/hpf   Bacteria, UA NONE SEEN NONE SEEN   Squamous Epithelial / LPF 0-5 0 - 5   Mucus PRESENT    Hyaline Casts, UA PRESENT    US Abdomen Limited  Result Date: 09/25/2018 CLINICAL DATA:  Right quadrant pain EXAM: ULTRASOUND ABDOMEN LIMITED RIGHT UPPER QUADRANT COMPARISON:  None. FINDINGS: Gallbladder: No gallstones or wall thickening visualized. No sonographic Murphy sign noted by sonographer. Common bile duct: Diameter: Liver: No focal lesion identified. Within normal limits in parenchymal echogenicity. Portal vein is patent on color Doppler imaging with normal direction of blood flow towards the liver. IMPRESSION: No cholelithiasis or sonographic evidence of acute cholecystitis. Electronically Signed   By: Kathreen Devoid   On: 09/25/2018 12:06     EKG None  Radiology Ct Chest W Contrast  Result Date: 09/25/2018 CLINICAL DATA:  Right upper abdominal/costal margin pain with leukocytosis. Rule out infectious process. EXAM: CT CHEST, ABDOMEN, AND PELVIS WITH CONTRAST TECHNIQUE: Multidetector CT imaging of the chest, abdomen and pelvis was performed following the standard protocol during bolus administration of intravenous  contrast. CONTRAST:  112mL ISOVUE-300 IOPAMIDOL (ISOVUE-300) INJECTION 61% COMPARISON:  Right upper quadrant abdominal ultrasound 09/25/2018 FINDINGS: CT CHEST FINDINGS Cardiovascular: Normal heart size without pericardial effusion or thickening. Left main and three-vessel coronary arteriosclerosis is noted. 4.3 cm ascending thoracic aortic aneurysm with minimal aortic atherosclerosis along the descending thoracic aorta. No dissection. Patent great vessels with atherosclerosis of the left subclavian artery origin. No large central pulmonary embolus though study is not tailored toward assessment of pulmonary emboli. Mediastinum/Nodes: No enlarged mediastinal, hilar, or axillary lymph nodes. A calcified mediastinal lymph node is noted along the right lower paratracheal portion consistent with old granulomatous disease. Thyroid gland, trachea, and esophagus demonstrate no significant findings. Lungs/Pleura: Calcified 7 mm pulmonary nodule at the right lung apex consistent with a small granuloma. Minimal biapical pleuroparenchymal scarring. No pulmonary confluence, overt pulmonary edema nor dominant mass. Bibasilar atelectasis is noted at each lung base slightly more focal in the left lower lobe posteriorly. Musculoskeletal: No acute nor suspicious osseous abnormalities. Osteoarthritis of the included left glenohumeral joint. CT ABDOMEN PELVIS FINDINGS Hepatobiliary: No focal liver abnormality is seen. No gallstones, gallbladder wall thickening, or biliary dilatation. Pancreas: Pancreas is fatty replaced. Hazy appearance of the peripancreatic fat and pancreatic gland along the pancreatic head and uncinate process may reflect mild pancreatitis without complicating features. As the second portion of the duodenum is also in this location, stigmata of duodenitis is also remote possibility though believed less likely given lack of significant mural thickening. Spleen: Normal Adrenals/Urinary Tract: Normal bilateral adrenal  glands. 19 mm cyst in the interpolar aspect of the right kidney with 17 mm cyst in the lower pole. No hydroureteronephrosis. No ureteral calculus. The urinary bladder is unremarkable for degree of distention. Stomach/Bowel: Physiologic distention of the stomach. Normal small bowel rotation and ligament of Treitz position. No small bowel obstruction or inflammation. Normal-appearing appendix. Moderate stool retention within the right colon. No significant mural thickening of the colon nor  inflammatory change. Circular muscle hypertrophy and sigmoid diverticulosis is noted. Vascular/Lymphatic: Aortic atherosclerosis. No enlarged abdominal or pelvic lymph nodes. Reproductive: Prostate is unremarkable. Other: Small fat containing inguinal hernias. Musculoskeletal: Osteoarthritis of the right SI joint. Lower lumbar facet arthrosis. Moderate-to-marked degenerative disc disease L1 through L5. S1-S2 disc noted. No acute nor aggressive osseous lesions. Thoracolumbar spondylosis is identified with multilevel degenerative disc and osteophyte formation. IMPRESSION: 1. 4.3 cm ascending thoracic aortic aneurysm without dissection. Recommend annual imaging followup by CTA or MRA. This recommendation follows 2010 ACCF/AHA/AATS/ACR/ASA/SCA/SCAI/SIR/STS/SVM Guidelines for the Diagnosis and Management of Patients with Thoracic Aortic Disease. 2010; 121: Z124-P809. 2. Left main and three-vessel coronary arteriosclerosis. 3. Calcified 7 mm pulmonary nodule in the right lung apex consistent with a small granuloma. Right paratracheal calcified lymph node also consistent with old granulomatous disease. 4. Hazy appearance of the peripancreatic fat and pancreatic gland along the pancreatic head and uncinate process question acute uncomplicated pancreatitis. As the second portion of the duodenum is also in this location, stigmata of duodenitis is a possibility though believed less likely given lack of significant mural thickening. 5. Simple  right-sided renal cysts. 6. Thoracolumbar spondylosis. Electronically Signed   By: Ashley Royalty M.D.   On: 09/25/2018 14:08   Ct Abdomen Pelvis W Contrast  Result Date: 09/25/2018 CLINICAL DATA:  Right upper abdominal/costal margin pain with leukocytosis. Rule out infectious process. EXAM: CT CHEST, ABDOMEN, AND PELVIS WITH CONTRAST TECHNIQUE: Multidetector CT imaging of the chest, abdomen and pelvis was performed following the standard protocol during bolus administration of intravenous contrast. CONTRAST:  185mL ISOVUE-300 IOPAMIDOL (ISOVUE-300) INJECTION 61% COMPARISON:  Right upper quadrant abdominal ultrasound 09/25/2018 FINDINGS: CT CHEST FINDINGS Cardiovascular: Normal heart size without pericardial effusion or thickening. Left main and three-vessel coronary arteriosclerosis is noted. 4.3 cm ascending thoracic aortic aneurysm with minimal aortic atherosclerosis along the descending thoracic aorta. No dissection. Patent great vessels with atherosclerosis of the left subclavian artery origin. No large central pulmonary embolus though study is not tailored toward assessment of pulmonary emboli. Mediastinum/Nodes: No enlarged mediastinal, hilar, or axillary lymph nodes. A calcified mediastinal lymph node is noted along the right lower paratracheal portion consistent with old granulomatous disease. Thyroid gland, trachea, and esophagus demonstrate no significant findings. Lungs/Pleura: Calcified 7 mm pulmonary nodule at the right lung apex consistent with a small granuloma. Minimal biapical pleuroparenchymal scarring. No pulmonary confluence, overt pulmonary edema nor dominant mass. Bibasilar atelectasis is noted at each lung base slightly more focal in the left lower lobe posteriorly. Musculoskeletal: No acute nor suspicious osseous abnormalities. Osteoarthritis of the included left glenohumeral joint. CT ABDOMEN PELVIS FINDINGS Hepatobiliary: No focal liver abnormality is seen. No gallstones, gallbladder  wall thickening, or biliary dilatation. Pancreas: Pancreas is fatty replaced. Hazy appearance of the peripancreatic fat and pancreatic gland along the pancreatic head and uncinate process may reflect mild pancreatitis without complicating features. As the second portion of the duodenum is also in this location, stigmata of duodenitis is also remote possibility though believed less likely given lack of significant mural thickening. Spleen: Normal Adrenals/Urinary Tract: Normal bilateral adrenal glands. 19 mm cyst in the interpolar aspect of the right kidney with 17 mm cyst in the lower pole. No hydroureteronephrosis. No ureteral calculus. The urinary bladder is unremarkable for degree of distention. Stomach/Bowel: Physiologic distention of the stomach. Normal small bowel rotation and ligament of Treitz position. No small bowel obstruction or inflammation. Normal-appearing appendix. Moderate stool retention within the right colon. No significant mural thickening of the colon nor inflammatory  change. Circular muscle hypertrophy and sigmoid diverticulosis is noted. Vascular/Lymphatic: Aortic atherosclerosis. No enlarged abdominal or pelvic lymph nodes. Reproductive: Prostate is unremarkable. Other: Small fat containing inguinal hernias. Musculoskeletal: Osteoarthritis of the right SI joint. Lower lumbar facet arthrosis. Moderate-to-marked degenerative disc disease L1 through L5. S1-S2 disc noted. No acute nor aggressive osseous lesions. Thoracolumbar spondylosis is identified with multilevel degenerative disc and osteophyte formation. IMPRESSION: 1. 4.3 cm ascending thoracic aortic aneurysm without dissection. Recommend annual imaging followup by CTA or MRA. This recommendation follows 2010 ACCF/AHA/AATS/ACR/ASA/SCA/SCAI/SIR/STS/SVM Guidelines for the Diagnosis and Management of Patients with Thoracic Aortic Disease. 2010; 121: M546-T035. 2. Left main and three-vessel coronary arteriosclerosis. 3. Calcified 7 mm  pulmonary nodule in the right lung apex consistent with a small granuloma. Right paratracheal calcified lymph node also consistent with old granulomatous disease. 4. Hazy appearance of the peripancreatic fat and pancreatic gland along the pancreatic head and uncinate process question acute uncomplicated pancreatitis. As the second portion of the duodenum is also in this location, stigmata of duodenitis is a possibility though believed less likely given lack of significant mural thickening. 5. Simple right-sided renal cysts. 6. Thoracolumbar spondylosis. Electronically Signed   By: Ashley Royalty M.D.   On: 09/25/2018 14:08   US Abdomen Limited  Result Date: 09/25/2018 CLINICAL DATA:  Right quadrant pain EXAM: ULTRASOUND ABDOMEN LIMITED RIGHT UPPER QUADRANT COMPARISON:  None. FINDINGS: Gallbladder: No gallstones or wall thickening visualized. No sonographic Murphy sign noted by sonographer. Common bile duct: Diameter: Liver: No focal lesion identified. Within normal limits in parenchymal echogenicity. Portal vein is patent on color Doppler imaging with normal direction of blood flow towards the liver. IMPRESSION: No cholelithiasis or sonographic evidence of acute cholecystitis. Electronically Signed   By: Kathreen Devoid   On: 09/25/2018 12:06    Procedures Procedures (including critical care time)  Medications Ordered in ED Medications  HYDROmorphone (DILAUDID) injection 0.5 mg (has no administration in time range)  ondansetron (ZOFRAN) injection 4 mg (has no administration in time range)  sodium chloride 0.9 % bolus 1,000 mL (has no administration in time range)     Initial Impression / Assessment and Plan / ED Course  I have reviewed the triage vital signs and the nursing notes.  Pertinent labs & imaging results that were available during my care of the patient were reviewed by me and considered in my medical decision making (see chart for details).  Iv ns bolus. Labs and ultrasound  ordered.  Dilaudid .5 mg iv. zofran iv. (pt indicates spouse drove him, does not have to drive).  Reviewed nursing notes and prior charts for additional history.   U/s reviewed - neg for gb issue. ua is pending.   Pain improved w meds.   Labs reviewed - chem normal, lipase slightly elev. Wbc high - pt denies fever/chills, denies dysuria or gu c/o, denies cough or uri symptoms.   As cause ruq pain/back pain not entirely clear from initial labs and u/s, will get ct.   Ct w ?uncompl pancreatitis vs duodenitis. Several incidental findings also noted including cardiac calcification and thoracic aneurysm - discussed w pt. Pt has fam hx cad/father in his mid 19s. Pt denies and current or recent chest pain, no sob or unusual doe.   Will tx for pain, and start on acid blocker therapy. Discussed need for close pcp f/u, and f/u of ct findings. Return precautions provided.  Pain improved. Afebrile. Vitals normal. Pt currently appears stable for d/c.  Final Clinical Impressions(s) / ED Diagnoses   Final diagnoses:  None    ED Discharge Orders    None       Lajean Saver, MD 09/25/18 1443

## 2018-09-25 NOTE — Telephone Encounter (Signed)
CRITICAL VALUE STICKER  CRITICAL VALUE:  WBC 20.2  RECEIVER (on-site recipient of call): Gwenyth Ober CMA  DATE & TIME NOTIFIED: 10:35 am 09/25/2018 MD NOTIFIED: Yes via verbal and message  TIME OF NOTIFICATION: 10:45am 09/25/2018  RESPONSE: No response required pt in ED now

## 2018-09-25 NOTE — ED Notes (Signed)
As I write this, he is in CT.

## 2018-09-25 NOTE — Discharge Instructions (Addendum)
It was our pleasure to provide your ER care today - we hope that you feel better.  Your ct scan was read as follows: IMPRESSION: 1. 4.3 cm ascending thoracic aortic aneurysm without dissection. Recommend annual imaging CTA or MRA.  2. Left main and three-vessel coronary arteriosclerosis. 3. Calcified 7 mm pulmonary nodule in the right lung apex consistent with a small granuloma. Right paratracheal calcified lymph node also consistent with old granulomatous disease. 4. Hazy appearance of the peripancreatic fat and pancreatic gland along the pancreatic head and uncinate process question acute uncomplicated pancreatitis. As the second portion of the duodenum is also in this location, stigmata of duodenitis is a possibility though believed less likely given lack of significant mural thickening. 5. Simple right-sided renal cysts.  6. Thoracolumbar spondylosis.  *From the CT reading, it appears you may have mild pancreatitis vs duodenitis.   Drink plenty of fluids. Clear liquid diet for the next day, then advance as tolerated. Take protonix (acid blocker medication). You may take ultram as need for pain - no driving for the next 4 hours, or when taking ultram.  *Follow up with Dr Elease Hashimoto, in the next 1-2 days for recheck. Have him review your labs and ct findings - regarding the thoracic aneurysm - have him arrange follow up and follow up imaging. For the coronary calcifications, discuss possible referral to cardiology for additional testing.   Return to ER if worse, new symptoms, fevers, worsening or severe pain, new pain, chest pain, trouble breathing.

## 2018-09-25 NOTE — Telephone Encounter (Signed)
noted 

## 2018-09-27 ENCOUNTER — Other Ambulatory Visit: Payer: Medicare Other

## 2018-10-19 ENCOUNTER — Other Ambulatory Visit: Payer: Self-pay

## 2018-10-19 ENCOUNTER — Encounter: Payer: Self-pay | Admitting: Family Medicine

## 2018-10-19 ENCOUNTER — Ambulatory Visit (INDEPENDENT_AMBULATORY_CARE_PROVIDER_SITE_OTHER): Payer: Medicare Other | Admitting: Family Medicine

## 2018-10-19 VITALS — BP 120/78 | HR 80 | Temp 98.0°F | Ht 73.5 in | Wt 243.4 lb

## 2018-10-19 DIAGNOSIS — I712 Thoracic aortic aneurysm, without rupture: Secondary | ICD-10-CM | POA: Insufficient documentation

## 2018-10-19 DIAGNOSIS — I7121 Aneurysm of the ascending aorta, without rupture: Secondary | ICD-10-CM | POA: Insufficient documentation

## 2018-10-19 DIAGNOSIS — Z1331 Encounter for screening for depression: Secondary | ICD-10-CM | POA: Diagnosis not present

## 2018-10-19 DIAGNOSIS — Z0000 Encounter for general adult medical examination without abnormal findings: Secondary | ICD-10-CM | POA: Diagnosis not present

## 2018-10-19 DIAGNOSIS — Z136 Encounter for screening for cardiovascular disorders: Secondary | ICD-10-CM | POA: Diagnosis not present

## 2018-10-19 LAB — BASIC METABOLIC PANEL
BUN: 16 mg/dL (ref 6–23)
CALCIUM: 9.3 mg/dL (ref 8.4–10.5)
CO2: 28 mEq/L (ref 19–32)
Chloride: 103 mEq/L (ref 96–112)
Creatinine, Ser: 0.95 mg/dL (ref 0.40–1.50)
GFR: 82.49 mL/min (ref >60.00)
Glucose, Bld: 111 mg/dL — ABNORMAL HIGH (ref 70–99)
Potassium: 4.8 mEq/L (ref 3.5–5.1)
Sodium: 141 mEq/L (ref 135–145)

## 2018-10-19 LAB — HEPATIC FUNCTION PANEL
ALK PHOS: 45 U/L (ref 39–117)
ALT: 15 U/L (ref 0–53)
AST: 14 U/L (ref 0–37)
Albumin: 4.1 g/dL (ref 3.5–5.2)
BILIRUBIN TOTAL: 1 mg/dL (ref 0.2–1.2)
Bilirubin, Direct: 0.2 mg/dL (ref 0.0–0.3)
Total Protein: 6.5 g/dL (ref 6.0–8.3)

## 2018-10-19 LAB — LIPID PANEL
CHOLESTEROL: 124 mg/dL (ref 0–200)
HDL: 52.7 mg/dL (ref >39.00)
LDL Cholesterol: 55 mg/dL (ref 0–99)
NonHDL: 71.34
Total CHOL/HDL Ratio: 2
Triglycerides: 83 mg/dL (ref 0.0–149.0)
VLDL: 16.6 mg/dL (ref 0.0–40.0)

## 2018-10-19 LAB — PSA: PSA: 2.53 ng/mL (ref 0.10–4.00)

## 2018-10-19 LAB — TSH: TSH: 1.65 u[IU]/mL (ref 0.35–4.50)

## 2018-10-19 NOTE — Patient Instructions (Signed)
We will set up Cardiology appointment.  Will also set up abdominal aortic aneurysm screen.

## 2018-10-19 NOTE — Progress Notes (Signed)
Subjective:     Patient ID: Paul Townsend, male   DOB: 13-Mar-1946, 72 y.o.   MRN: [id number]  HPI Patient seen for physical.  He had recent episode of acute pancreatitis.  Etiology unclear.  No gallstones.  Does not drink alcohol regularly and usually only about 1 beer when he drinks.  No recurrence of symptoms since then.  In the process of work-up he had CT scan which did show 4.3 cm ascending thoracic aneurysm and also mention of atherosclerosis left main and three-vessel.  He exercises regularly had no chest pain whatsoever with exercise.  No history of stress testing.  No dyspnea with exercise.  Good exercise tolerance.  Intermittent depression symptoms.  No suicidal ideation.  IMPRESSION: 1. 4.3 cm ascending thoracic aortic aneurysm without dissection. Recommend annual imaging followup by CTA or MRA. This recommendation follows 2010 ACCF/AHA/AATS/ACR/ASA/SCA/SCAI/SIR/STS/SVM Guidelines for the Diagnosis and Management of Patients with Thoracic Aortic Disease. 2010; 121: Y101-B510. 2. Left main and three-vessel coronary arteriosclerosis. 3. Calcified 7 mm pulmonary nodule in the right lung apex consistent with a small granuloma. Right paratracheal calcified lymph node also consistent with old granulomatous disease. 4. Hazy appearance of the peripancreatic fat and pancreatic gland along the pancreatic head and uncinate process question acute uncomplicated pancreatitis. As the second portion of the duodenum is also in this location, stigmata of duodenitis is a possibility though believed less likely given lack of significant mural thickening. 5. Simple right-sided renal cysts. 6. Thoracolumbar spondylosis.     Past Medical History:  Diagnosis Date  . Arthritis   . Hyperlipidemia   . Hypertension    Past Surgical History:  Procedure Laterality Date  . ORIF Rt femur Right 01/05/2014  . TONSILLECTOMY    . TOTAL KNEE ARTHROPLASTY    . VARICOSE VEIN SURGERY      reports that he has quit smoking. He has never used smokeless tobacco. He reports current alcohol use. No history on file for drug. family history includes Arthritis in his father; Cancer in his mother; Heart disease (age of onset: 69) in his father; Hypertension in his father. No Known Allergies   Review of Systems  Constitutional: Negative for activity change, appetite change, fatigue and fever.  HENT: Negative for congestion, ear pain and trouble swallowing.   Eyes: Negative for pain and visual disturbance.  Respiratory: Negative for cough, shortness of breath and wheezing.   Cardiovascular: Negative for chest pain and palpitations.  Gastrointestinal: Negative for abdominal distention, abdominal pain, blood in stool, constipation, diarrhea, nausea, rectal pain and vomiting.  Genitourinary: Negative for dysuria, hematuria and testicular pain.  Musculoskeletal: Negative for arthralgias and joint swelling.  Skin: Negative for rash.  Neurological: Negative for dizziness, syncope and headaches.  Hematological: Negative for adenopathy.  Psychiatric/Behavioral: Positive for dysphoric mood. Negative for confusion.       Objective:   Physical Exam Constitutional:      General: He is not in acute distress.    Appearance: He is well-developed.  HENT:     Head: Normocephalic and atraumatic.     Right Ear: External ear normal.     Left Ear: External ear normal.  Eyes:     Conjunctiva/sclera: Conjunctivae normal.     Pupils: Pupils are equal, round, and reactive to light.  Neck:     Musculoskeletal: Normal range of motion and neck supple.     Thyroid: No thyromegaly.  Cardiovascular:     Rate and Rhythm: Normal rate and regular rhythm.  Heart sounds: Normal heart sounds. No murmur.  Pulmonary:     Effort: No respiratory distress.     Breath sounds: No wheezing or rales.  Abdominal:     General: Bowel sounds are normal. There is no distension.     Palpations: Abdomen is soft.      Tenderness: There is no abdominal tenderness. There is no guarding or rebound.     Comments: Small umbilical hernia.  Soft and nontender  Lymphadenopathy:     Cervical: No cervical adenopathy.  Skin:    Findings: No rash.  Neurological:     Mental Status: He is alert and oriented to person, place, and time.     Cranial Nerves: No cranial nerve deficit.     Deep Tendon Reflexes: Reflexes normal.  Psychiatric:     Comments: PHQ 9 score of 6        Assessment:     Physical exam.  Several issues addressed as below  -Recent acute pancreatitis with symptoms fully resolved.  Etiology unclear -Mention of 4.3 cm ascending aneurysm on CT scan -Mention of atherosclerosis left main and three-vessel.  Patient has good exercise tolerance and currently asymptomatic.  -Increased depression symptoms    Plan:     -Check PHQ 9 =6. -Obtain lab work -The natural history of prostate cancer and ongoing controversy regarding screening and potential treatment outcomes of prostate cancer has been discussed with the patient. The meaning of a false positive PSA and a false negative PSA has been discussed. He indicates understanding of the limitations of this screening test and wishes to proceed with screening PSA testing. -Set up Medicare abdominal aortic aneurysm screen -Set up cardiology referral to consider stress testing or other further evaluation regarding his atherosclerotic findings on exam  Eulas Post MD [facility]

## 2018-10-24 ENCOUNTER — Other Ambulatory Visit: Payer: Self-pay | Admitting: Family Medicine

## 2018-10-24 ENCOUNTER — Encounter (INDEPENDENT_AMBULATORY_CARE_PROVIDER_SITE_OTHER): Payer: Self-pay

## 2018-10-24 ENCOUNTER — Ambulatory Visit (HOSPITAL_COMMUNITY)
Admission: RE | Admit: 2018-10-24 | Discharge: 2018-10-24 | Disposition: A | Discharge status: Home / Self Care | Payer: Medicare Other | Source: Ambulatory Visit | Attending: Cardiovascular Disease | Admitting: Cardiovascular Disease

## 2018-10-24 DIAGNOSIS — Z136 Encounter for screening for cardiovascular disorders: Secondary | ICD-10-CM | POA: Diagnosis not present

## 2018-10-24 DIAGNOSIS — Z87891 Personal history of nicotine dependence: Secondary | ICD-10-CM

## 2018-11-27 ENCOUNTER — Other Ambulatory Visit: Payer: Self-pay | Admitting: Family Medicine

## 2018-11-28 ENCOUNTER — Encounter: Payer: Self-pay | Admitting: Family Medicine

## 2018-11-28 DIAGNOSIS — Z1211 Encounter for screening for malignant neoplasm of colon: Secondary | ICD-10-CM | POA: Diagnosis not present

## 2018-11-28 DIAGNOSIS — D128 Benign neoplasm of rectum: Secondary | ICD-10-CM | POA: Diagnosis not present

## 2018-11-28 DIAGNOSIS — K621 Rectal polyp: Secondary | ICD-10-CM | POA: Diagnosis not present

## 2018-11-28 DIAGNOSIS — Z800 Family history of malignant neoplasm of digestive organs: Secondary | ICD-10-CM | POA: Diagnosis not present

## 2018-11-28 DIAGNOSIS — K5730 Diverticulosis of large intestine without perforation or abscess without bleeding: Secondary | ICD-10-CM | POA: Diagnosis not present

## 2019-01-04 NOTE — Progress Notes (Signed)
Cardiology Office Note   Date:  01/08/2019   ID:  Paul Townsend, Paul Townsend 1946-10-15, MRN [id number]  PCP:  Eulas Post, MD  Cardiologist:   Jenkins Rouge, MD   No chief complaint on file.     History of Present Illness: Paul Townsend is a 73 y.o. male who presents for consultation regarding ascending aortic aneurysm and CAD seen on CT scan. Patient had pancreatitis and 4.3 cm ascending aortic root dilatation noted on CT done 09/25/18 Also commented on coronary calcification. 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 Arthritis   . Hyperlipidemia   . Hypertension     Past Surgical History:  Procedure Laterality Date  . ORIF Rt femur Right 01/05/2014  . TONSILLECTOMY    . TOTAL KNEE ARTHROPLASTY    . VARICOSE VEIN SURGERY       Current Outpatient Medications  Medication Sig Dispense Refill  . aspirin (GOODSENSE ASPIRIN) 81 MG chewable tablet Chew 81 mg by mouth.    . Calcium Citrate-Vitamin D (CALCIUM + D PO) Take by mouth.    . ezetimibe-simvastatin (VYTORIN) 10-20 MG tablet TAKE 1 TABLET AT BEDTIME 90 tablet 3  . hydrochlorothiazide (MICROZIDE) 12.5 MG capsule TAKE 1 CAPSULE DAILY 90 capsule 3  . lisinopril (PRINIVIL,ZESTRIL) 10 MG tablet TAKE 1 TABLET DAILY (NEED AN APPOINTMENT) 90 tablet 4  . omeprazole (PRILOSEC OTC) 20 MG tablet Take by mouth.    . sildenafil (VIAGRA) 100 MG tablet Take 1 tablet (100 mg total) by mouth daily as needed for erectile dysfunction. 6 tablet 11  . vitamin C (ASCORBIC ACID) 500 MG tablet Take 500 mg by mouth daily.      . Zinc Acetate, Oral,  (ZINC ACETATE PO) Take by mouth.     No current facility-administered medications for this visit.     Allergies:   Patient has no known allergies.    Social History:  The patient  reports that he has quit smoking. He has never used smokeless tobacco. He reports current alcohol use.   Family History:  The patient's family history includes Arthritis in his father; Cancer in his mother; Heart disease (age of onset: 86) in his father; Hypertension in his father.    ROS:  Please see the history of present illness.   Otherwise, review of systems are positive for none.   All other systems are reviewed and negative.    PHYSICAL EXAM: VS:  BP 118/82   Pulse 84   Ht 6' 1.5" (1.867 m)   Wt 112.5 kg   SpO2 96%   BMI 32.28 kg/m  , BMI Body mass index is 32.28 kg/m. Affect appropriate Healthy:  appears stated age 26: normal Neck supple with no adenopathy JVP normal no bruits no thyromegaly Lungs clear with no wheezing and good diaphragmatic motion Heart:  S1/S2 no murmur, no rub, gallop or click PMI normal Abdomen: benighn, BS positve, no tenderness, no AAA no bruit.  No HSM or HJR Distal pulses intact with no bruits No edema Neuro non-focal Skin warm and dry No muscular weakness    EKG:  01/08/19 SR PVC rate 84 ? Old IMI    Recent Labs: 09/25/2018: Hemoglobin 16.8; Platelets 214 10/19/2018: ALT 15; BUN 16; Creatinine, Ser 0.95; Potassium 4.8; Sodium 141; TSH 1.65    Lipid Panel    Component Value Date/Time   CHOL 124 10/19/2018 0903   TRIG 83.0 10/19/2018 0903   HDL 52.70 10/19/2018 0903   CHOLHDL 2 10/19/2018 0903   VLDL 16.6 10/19/2018 0903   LDLCALC 55 10/19/2018 0903   LDLDIRECT 114.7 08/19/2013 0953      Wt Readings from Last 3 Encounters:  01/08/19 112.5 kg  10/19/18 110.4 kg  09/25/18 112.9 kg      Other studies Reviewed: Additional studies/ records that were reviewed today include: Notes from primary Hospital notes pancreatitis CT abdomen US abdomen  labs and ECG .    ASSESSMENT AND PLAN:  1.  Thoracic Aneurysm:  4.3 cm f/u CTA  November 2020 BP control  2.  CAD:  On CT will risk stratify with exercise Myovue 3. HTN:  Well controlled.  Continue current medications and low sodium Dash type diet.   4.  HLD:  Continue Vytorin target LDL 75 or less given CAD 5. GERD:  Continue prilosec and protonix f/u primary    Current medicines are reviewed at length with the patient today.  The patient does not have concerns regarding medicines.  The following changes have been made:  no change  Labs/ tests ordered today include: Myovue CTA chest November 2020   Orders Placed This Encounter  Procedures  . MYOCARDIAL PERFUSION IMAGING  . EKG 12-Lead     Disposition:   FU with cardiology in a year      Signed, Jenkins Rouge, MD  01/08/2019 10:07 AM    [address], [place], [place]  [address] Phone: [phone]; Fax: [phone]

## 2019-01-08 ENCOUNTER — Ambulatory Visit (INDEPENDENT_AMBULATORY_CARE_PROVIDER_SITE_OTHER): Payer: Medicare Other | Admitting: Cardiovascular Disease

## 2019-01-08 ENCOUNTER — Encounter: Payer: Self-pay | Admitting: Cardiovascular Disease

## 2019-01-08 VITALS — BP 118/82 | HR 84 | Ht 73.5 in | Wt 248.0 lb

## 2019-01-08 DIAGNOSIS — E785 Hyperlipidemia, unspecified: Secondary | ICD-10-CM

## 2019-01-08 DIAGNOSIS — I2510 Atherosclerotic heart disease of native coronary artery without angina pectoris: Secondary | ICD-10-CM | POA: Diagnosis not present

## 2019-01-08 DIAGNOSIS — I712 Thoracic aortic aneurysm, without rupture, unspecified: Secondary | ICD-10-CM

## 2019-01-08 NOTE — Patient Instructions (Addendum)
Medication Instructions:   If you need a refill on your cardiac medications before your next appointment, please call your pharmacy.   Lab work:  If you have labs (blood work) drawn today and your tests are completely normal, you will receive your results only by: Marland Kitchen MyChart Message (if you have MyChart) OR . A paper copy in the mail If you have any lab test that is abnormal or we need to change your treatment, we will call you to review the results.  Testing/Procedures: Your physician has requested that you have en exercise stress myoview. For further information please visit [email or web address]. Please follow instruction sheet, as given.  Follow-Up: At [facility], you and your health needs are our priority.  As part of our continuing mission to provide you with exceptional heart care, we have created designated Provider Care Teams.  These Care Teams include your primary Cardiologist (physician) and Advanced Practice Providers (APPs -  Physician Assistants and Nurse Practitioners) who all work together to provide you with the care you need, when you need it. You will need a follow up appointment in 12 months.  Please call our office 2 months in advance to schedule this appointment.  You may see Dr. Johnsie Cancel or one of the following Advanced Practice Providers on your designated Care Team:   Truitt Merle, NP Cecilie Kicks, NP . Kathyrn Drown, NP

## 2019-02-27 ENCOUNTER — Telehealth: Payer: Self-pay | Admitting: Cardiovascular Disease

## 2019-02-27 NOTE — Telephone Encounter (Signed)
Pt returning this office call stated he miss it a moment ago please give him a call back.  He is waiting.

## 2019-02-27 NOTE — Telephone Encounter (Signed)
   Primary Cardiologist:  Johnsie Cancel   Patient contacted.  History reviewed. Left V/M  The patient was seen in consultation by Dr. Collier Salina admission on March 3.  He was noted to have a small thoracic aneurysm.  He had a CT scan which showed coronary calcifications.  He apparently is asymptomatic.  He works out on a stationary bike on a regular basis.  An exercise Myoview study was ordered in order to further assess the coronary artery calcifications.     No symptoms to suggest any unstable cardiac conditions.  Based on discussion, with current pandemic situation, we will be postponing this appointment for Kathaleen Bury with a plan for f/u in  12 wks or sooner if feasible/necessary.  If symptoms change, he has been instructed to contact our office.   Routing to C19 CANCEL pool for tracking (P CV DIV CV19 CANCEL - reason for visit "other.") and assigning priority (1 = 4-6 wks, 2 = 6-12 wks, 3 = >12 wks).   Mertie Moores, MD  02/27/2019 12:00 PM         .

## 2019-02-27 NOTE — Telephone Encounter (Signed)
Pt called Dr back please give him a call back ... per pt was on ZOOM call at the time.    Pt stated thank you

## 2019-03-05 ENCOUNTER — Encounter (HOSPITAL_COMMUNITY): Payer: Medicare Other

## 2019-03-18 ENCOUNTER — Telehealth (HOSPITAL_COMMUNITY): Payer: Self-pay

## 2019-03-19 ENCOUNTER — Other Ambulatory Visit: Payer: Self-pay

## 2019-03-19 ENCOUNTER — Ambulatory Visit (HOSPITAL_COMMUNITY): Payer: Medicare Other | Attending: Cardiovascular Disease

## 2019-03-19 ENCOUNTER — Encounter (HOSPITAL_COMMUNITY): Payer: Self-pay

## 2019-03-19 DIAGNOSIS — I712 Thoracic aortic aneurysm, without rupture, unspecified: Secondary | ICD-10-CM

## 2019-03-19 DIAGNOSIS — E785 Hyperlipidemia, unspecified: Secondary | ICD-10-CM | POA: Diagnosis not present

## 2019-03-19 DIAGNOSIS — I2510 Atherosclerotic heart disease of native coronary artery without angina pectoris: Secondary | ICD-10-CM

## 2019-03-19 LAB — MYOCARDIAL PERFUSION IMAGING
LV dias vol: 98 mL (ref 62–150)
LV sys vol: 41 mL
Peak HR: 77 {beats}/min
Rest HR: 68 {beats}/min
SDS: 2
SRS: 0
SSS: 2
TID: 1.02

## 2019-03-19 MED ORDER — TECHNETIUM TC 99M TETROFOSMIN IV KIT
31.5000 | PACK | Freq: Once | INTRAVENOUS | Status: AC | PRN
  Administered 2019-03-19: 31.5 via INTRAVENOUS
  Filled 2019-03-19: qty 32

## 2019-03-19 MED ORDER — TECHNETIUM TC 99M TETROFOSMIN IV KIT
9.2000 | PACK | Freq: Once | INTRAVENOUS | Status: AC | PRN
  Administered 2019-03-19: 9.2 via INTRAVENOUS
  Filled 2019-03-19: qty 10

## 2019-03-19 MED ORDER — REGADENOSON 0.4 MG/5ML IV SOLN
0.4000 mg | Freq: Once | INTRAVENOUS | Status: AC
  Administered 2019-03-19: 0.4 mg via INTRAVENOUS

## 2019-04-09 DIAGNOSIS — M2342 Loose body in knee, left knee: Secondary | ICD-10-CM | POA: Diagnosis not present

## 2019-04-09 DIAGNOSIS — Z96653 Presence of artificial knee joint, bilateral: Secondary | ICD-10-CM | POA: Diagnosis not present

## 2019-04-09 DIAGNOSIS — M2341 Loose body in knee, right knee: Secondary | ICD-10-CM | POA: Diagnosis not present

## 2019-04-09 DIAGNOSIS — M8588 Other specified disorders of bone density and structure, other site: Secondary | ICD-10-CM | POA: Diagnosis not present

## 2019-04-09 DIAGNOSIS — I998 Other disorder of circulatory system: Secondary | ICD-10-CM | POA: Diagnosis not present

## 2019-04-09 DIAGNOSIS — Z981 Arthrodesis status: Secondary | ICD-10-CM | POA: Diagnosis not present

## 2019-04-09 DIAGNOSIS — M25461 Effusion, right knee: Secondary | ICD-10-CM | POA: Diagnosis not present

## 2019-04-09 DIAGNOSIS — Z9889 Other specified postprocedural states: Secondary | ICD-10-CM | POA: Diagnosis not present

## 2019-04-09 DIAGNOSIS — M25462 Effusion, left knee: Secondary | ICD-10-CM | POA: Diagnosis not present

## 2019-04-09 DIAGNOSIS — Z471 Aftercare following joint replacement surgery: Secondary | ICD-10-CM | POA: Diagnosis not present

## 2019-04-09 DIAGNOSIS — Z8781 Personal history of (healed) traumatic fracture: Secondary | ICD-10-CM | POA: Diagnosis not present

## 2019-04-09 DIAGNOSIS — Z96652 Presence of left artificial knee joint: Secondary | ICD-10-CM | POA: Diagnosis not present

## 2019-04-09 DIAGNOSIS — Z96651 Presence of right artificial knee joint: Secondary | ICD-10-CM | POA: Diagnosis not present

## 2019-04-09 DIAGNOSIS — S72461K Displaced supracondylar fracture with intracondylar extension of lower end of right femur, subsequent encounter for closed fracture with nonunion: Secondary | ICD-10-CM | POA: Diagnosis not present

## 2019-06-10 ENCOUNTER — Other Ambulatory Visit: Payer: Self-pay

## 2019-07-03 ENCOUNTER — Encounter: Payer: Self-pay | Admitting: Family Medicine

## 2019-07-03 NOTE — Telephone Encounter (Signed)
error:[id number] ° °

## 2019-07-16 ENCOUNTER — Other Ambulatory Visit: Payer: Self-pay

## 2019-07-16 ENCOUNTER — Ambulatory Visit (INDEPENDENT_AMBULATORY_CARE_PROVIDER_SITE_OTHER): Payer: Medicare Other | Admitting: Family Medicine

## 2019-07-16 DIAGNOSIS — Z23 Encounter for immunization: Secondary | ICD-10-CM

## 2019-07-16 NOTE — Progress Notes (Signed)
Gave patient flu vaccine in left deltoid. Patient tolerated well.

## 2019-08-07 DIAGNOSIS — H35372 Puckering of macula, left eye: Secondary | ICD-10-CM | POA: Diagnosis not present

## 2019-08-07 DIAGNOSIS — H1851 Endothelial corneal dystrophy: Secondary | ICD-10-CM | POA: Diagnosis not present

## 2019-08-07 DIAGNOSIS — H52203 Unspecified astigmatism, bilateral: Secondary | ICD-10-CM | POA: Diagnosis not present

## 2019-08-07 DIAGNOSIS — Z961 Presence of intraocular lens: Secondary | ICD-10-CM | POA: Diagnosis not present

## 2019-10-02 ENCOUNTER — Other Ambulatory Visit: Payer: Self-pay | Admitting: Family Medicine

## 2019-11-19 ENCOUNTER — Encounter: Payer: Medicare Other | Admitting: Family Medicine

## 2019-11-26 ENCOUNTER — Encounter: Payer: Self-pay | Admitting: Family Medicine

## 2019-11-26 ENCOUNTER — Other Ambulatory Visit: Payer: Self-pay

## 2019-11-26 ENCOUNTER — Ambulatory Visit (INDEPENDENT_AMBULATORY_CARE_PROVIDER_SITE_OTHER): Payer: Medicare Other | Admitting: Family Medicine

## 2019-11-26 VITALS — BP 110/72 | HR 72 | Temp 97.3°F | Ht 74.25 in | Wt 254.3 lb

## 2019-11-26 DIAGNOSIS — I712 Thoracic aortic aneurysm, without rupture, unspecified: Secondary | ICD-10-CM

## 2019-11-26 DIAGNOSIS — I10 Essential (primary) hypertension: Secondary | ICD-10-CM | POA: Diagnosis not present

## 2019-11-26 DIAGNOSIS — E785 Hyperlipidemia, unspecified: Secondary | ICD-10-CM | POA: Diagnosis not present

## 2019-11-26 DIAGNOSIS — Z0000 Encounter for general adult medical examination without abnormal findings: Secondary | ICD-10-CM | POA: Diagnosis not present

## 2019-11-26 DIAGNOSIS — Z125 Encounter for screening for malignant neoplasm of prostate: Secondary | ICD-10-CM | POA: Diagnosis not present

## 2019-11-26 DIAGNOSIS — I7121 Aneurysm of the ascending aorta, without rupture: Secondary | ICD-10-CM

## 2019-11-26 LAB — HEPATIC FUNCTION PANEL
ALT: 14 U/L (ref 0–53)
AST: 18 U/L (ref 0–37)
Albumin: 4.2 g/dL (ref 3.5–5.2)
Alkaline Phosphatase: 39 U/L (ref 39–117)
Bilirubin, Direct: 0.3 mg/dL (ref 0.0–0.3)
Total Bilirubin: 1.5 mg/dL — ABNORMAL HIGH (ref 0.2–1.2)
Total Protein: 6.8 g/dL (ref 6.0–8.3)

## 2019-11-26 LAB — BASIC METABOLIC PANEL
BUN: 13 mg/dL (ref 6–23)
CO2: 31 mEq/L (ref 19–32)
Calcium: 9.5 mg/dL (ref 8.4–10.5)
Chloride: 100 mEq/L (ref 96–112)
Creatinine, Ser: 1.01 mg/dL (ref 0.40–1.50)
GFR: 72.09 mL/min (ref >60.00)
Glucose, Bld: 125 mg/dL — ABNORMAL HIGH (ref 70–99)
Potassium: 4.2 mEq/L (ref 3.5–5.1)
Sodium: 138 mEq/L (ref 135–145)

## 2019-11-26 LAB — LIPID PANEL
Cholesterol: 159 mg/dL (ref 0–200)
HDL: 58.4 mg/dL (ref >39.00)
LDL Cholesterol: 74 mg/dL (ref 0–99)
NonHDL: 100.82
Total CHOL/HDL Ratio: 3
Triglycerides: 136 mg/dL (ref 0.0–149.0)
VLDL: 27.2 mg/dL (ref 0.0–40.0)

## 2019-11-26 LAB — TSH: TSH: 2.2 u[IU]/mL (ref 0.35–4.50)

## 2019-11-26 LAB — PSA, MEDICARE: PSA: 1.83 ng/ml (ref 0.10–4.00)

## 2019-11-26 NOTE — Patient Instructions (Signed)

## 2019-11-26 NOTE — Progress Notes (Signed)
Subjective:     Patient ID: Paul Townsend, male   DOB: 1945/12/06, 74 y.o.   MRN: [id number]  HPI   Gene is seen for Medicare subsequent annual wellness visit and medical follow-up.  He has chronic problems including hyperlipidemia and hypertension.  Compliant with medications.  Denies any side effects from medications.  He had episode of pancreatitis last year.  In the process he had CT scan which showed some coronary calcification.  He had subsequent cardiology referral and had nuclear stress test which was unremarkable.  Currently walking 3 and half miles per day and feels good overall.  No chest pains.  He also had note of 4.3 cm ascending thoracic aneurysm with recommended annual surveillance.  He had Medicare abdominal aortic aneurysm screen which was negative for aneurysm.  Health maintenance reviewed.  Colonoscopy up-to-date.  Immunizations up-to-date.  He has not gotten Covid vaccine yet though and he is looking for getting signed up for that  Past Medical History:  Diagnosis Date  . Arthritis   . Hyperlipidemia   . Hypertension    Past Surgical History:  Procedure Laterality Date  . ORIF Rt femur Right 01/05/2014  . TONSILLECTOMY    . TOOTH EXTRACTION     3 teeth  . TOTAL KNEE ARTHROPLASTY    . VARICOSE VEIN SURGERY      reports that he has quit smoking. He has never used smokeless tobacco. He reports current alcohol use. No history on file for drug. family history includes Arthritis in his father; Cancer in his mother; Heart disease (age of onset: 44) in his father; Hypertension in his father. No Known Allergies  1.  Risk factors based on Past Medical , Social, and Family history reviewed and as indicated above with no changes  2.  Limitations in physical activities None.  No recent falls. Walks 3-1/2 miles daily.  Good balance  3.  Depression/mood No active depression or anxiety issues PHQ 2 equals 0 He 4.  Hearing No deficits  5.  ADLs independent in  all.  6.  Cognitive function (orientation to time and place, language, writing, speech,memory) no short or long term memory issues.  Language and judgement intact.  7.  Home Safety no issues  8.  Height, weight, and visual acuity-he has had some weight gain over the past couple years.  He had recent eye exam and was given option of glasses for distance vision.  He states that exam was just a few months ago.  Wt Readings from Last 3 Encounters:  11/26/19 254 lb 4.8 oz (115.3 kg)  01/08/19 248 lb (112.5 kg)  10/19/18 243 lb 6.4 oz (110.4 kg)    9.  Counseling discussed  Counseled regarding age and gender appropriate preventative screenings and immunizations.  10. Recommendation of preventive services.  Discussed Covid vaccine and he was given information regarding website to check in for availability and signing up  11. Labs based on risk factors-lipid, hepatic, basic metabolic panel, TSH The natural history of prostate cancer and ongoing controversy regarding screening and potential treatment outcomes of prostate cancer has been discussed with the patient. The meaning of a false positive PSA and a false negative PSA has been discussed. He indicates understanding of the limitations of this screening test and wishes to proceed with screening PSA testing.   12. Care Plan-as below  13. Other Providers-none regularly  14. Written schedule of screening/prevention services given to patient. Health Maintenance  Topic Date Due  . TETANUS/TDAP  04/16/2022  . COLONOSCOPY  08/01/2023  . INFLUENZA VACCINE  Completed  . Hepatitis C Screening  Completed  . PNA vac Low Risk Adult  Completed   We discussed advanced directives then he has these in place   Review of Systems  Constitutional: Negative for activity change, appetite change, fatigue and fever.  HENT: Negative for congestion, ear pain and trouble swallowing.   Eyes: Negative for pain and visual disturbance.  Respiratory: Negative for  cough, shortness of breath and wheezing.   Cardiovascular: Negative for chest pain and palpitations.  Gastrointestinal: Negative for abdominal distention, abdominal pain, blood in stool, constipation, diarrhea, nausea, rectal pain and vomiting.  Genitourinary: Negative for dysuria, hematuria and testicular pain.  Musculoskeletal: Negative for arthralgias and joint swelling.  Skin: Negative for rash.  Neurological: Negative for dizziness, syncope and headaches.  Hematological: Negative for adenopathy.  Psychiatric/Behavioral: Negative for confusion and dysphoric mood.       Objective:   Physical Exam Constitutional:      General: He is not in acute distress.    Appearance: He is well-developed.  HENT:     Head: Normocephalic and atraumatic.     Right Ear: External ear normal.     Left Ear: External ear normal.  Eyes:     Conjunctiva/sclera: Conjunctivae normal.     Pupils: Pupils are equal, round, and reactive to light.  Neck:     Thyroid: No thyromegaly.  Cardiovascular:     Rate and Rhythm: Normal rate and regular rhythm.     Heart sounds: Normal heart sounds. No murmur.  Pulmonary:     Effort: No respiratory distress.     Breath sounds: No wheezing or rales.  Abdominal:     General: Bowel sounds are normal. There is no distension.     Palpations: Abdomen is soft. There is no mass.     Tenderness: There is no abdominal tenderness. There is no guarding or rebound.  Musculoskeletal:     Cervical back: Normal range of motion and neck supple.  Lymphadenopathy:     Cervical: No cervical adenopathy.  Skin:    Findings: No rash.  Neurological:     Mental Status: He is alert and oriented to person, place, and time.     Cranial Nerves: No cranial nerve deficit.     Deep Tendon Reflexes: Reflexes normal.        Assessment:     #1 Medicare subsequent annual wellness visit.  We addressed preventative issues as below  #2 hypertension stable and at goal  #3 hyperlipidemia  treated with statin  #4 ascending thoracic aneurysm noted incidentally on x-rays last year.  Needs annual follow-up    Plan:     -Labs as above -Set up CT angiogram of chest to reassess aneurysm and this will need to be done yearly -Continue regular exercise habits -Information given on Covid vaccine -Continue annual flu vaccine  Eulas Post MD [facility]

## 2019-12-20 DIAGNOSIS — Z23 Encounter for immunization: Secondary | ICD-10-CM | POA: Diagnosis not present

## 2020-01-03 ENCOUNTER — Other Ambulatory Visit: Payer: Self-pay | Admitting: *Deleted

## 2020-01-03 MED ORDER — EZETIMIBE-SIMVASTATIN 10-20 MG PO TABS: 1.0000 | ORAL_TABLET | Freq: Every day | ORAL | 0 refills | Status: DC

## 2020-01-03 MED ORDER — HYDROCHLOROTHIAZIDE 12.5 MG PO CAPS: 12.5000 mg | ORAL_CAPSULE | Freq: Every day | ORAL | 0 refills | Status: DC

## 2020-01-03 MED ORDER — LISINOPRIL 10 MG PO TABS: ORAL_TABLET | ORAL | 4 refills | Status: DC

## 2020-01-09 ENCOUNTER — Other Ambulatory Visit: Payer: Self-pay

## 2020-01-10 ENCOUNTER — Encounter: Payer: Self-pay | Admitting: Family Medicine

## 2020-01-10 ENCOUNTER — Other Ambulatory Visit: Payer: Self-pay

## 2020-01-10 ENCOUNTER — Telehealth: Payer: Self-pay | Admitting: Radiology

## 2020-01-10 ENCOUNTER — Ambulatory Visit (INDEPENDENT_AMBULATORY_CARE_PROVIDER_SITE_OTHER): Payer: Medicare Other | Admitting: Family Medicine

## 2020-01-10 VITALS — BP 118/72 | HR 78 | Temp 97.6°F | Ht 74.25 in | Wt 243.8 lb

## 2020-01-10 DIAGNOSIS — I10 Essential (primary) hypertension: Secondary | ICD-10-CM

## 2020-01-10 DIAGNOSIS — I712 Thoracic aortic aneurysm, without rupture: Secondary | ICD-10-CM

## 2020-01-10 DIAGNOSIS — R55 Syncope and collapse: Secondary | ICD-10-CM | POA: Diagnosis not present

## 2020-01-10 DIAGNOSIS — I7121 Aneurysm of the ascending aorta, without rupture: Secondary | ICD-10-CM

## 2020-01-10 NOTE — Progress Notes (Signed)
Subjective:     Patient ID: Paul Townsend, male   DOB: May 06, 1946, 74 y.o.   MRN: [id number]  HPI   Paul Townsend has history of hypertension, osteoarthritis, hyperlipidemia, prediabetes.  He had some recent weight gain and blood sugar was elevating up to 125 and we advised some weight loss.  He did lose about 8 pounds.  He had couple episodes recently of dizziness which he thought may be related to low blood pressure.  He states that around 10 PM on Wednesday 2 days ago he stood up from the couch after watching some television became very dizzy.  He states his legs 'gave ou't and he fell back on his buttock.  There was no full loss of consciousness.  Blood pressure immediately after that was 139/74.  He has had a few other episodes of dizziness when first standing.  He notices when he bends over and stands back up he feels dizzy frequently.  He has not had any low blood pressure readings by home monitoring  He is still walking about 3-1/2 miles or so around partner's house without any chest discomfort or other problems.  Occasional fluttering feeling of his heart after caffeine or coffee.  He admits that he does not drink a lot of water and wonders if he may be having some dehydration.  He has scaled back alcohol use.  He does have history of a ascending aortic aneurysm and we had ordered follow-up CT angiogram several weeks ago he has not heard back anything yet.  Past Medical History:  Diagnosis Date  . Arthritis   . Hyperlipidemia   . Hypertension    Past Surgical History:  Procedure Laterality Date  . ORIF Rt femur Right 01/05/2014  . TONSILLECTOMY    . TOOTH EXTRACTION     3 teeth  . TOTAL KNEE ARTHROPLASTY    . VARICOSE VEIN SURGERY      reports that he has quit smoking. He has never used smokeless tobacco. He reports current alcohol use. No history on file for drug. family history includes Arthritis in his father; Cancer in his mother; Heart disease (age of onset: 33) in his father;  Hypertension in his father. No Known Allergies   Review of Systems  Constitutional: Negative for chills, fatigue and fever.  Eyes: Negative for visual disturbance.  Respiratory: Negative for cough, chest tightness and shortness of breath.   Cardiovascular: Positive for palpitations. Negative for chest pain and leg swelling.  Neurological: Positive for dizziness and light-headedness. Negative for seizures, syncope, speech difficulty, weakness and headaches.       Objective:   Physical Exam Constitutional:      Appearance: He is well-developed.  HENT:     Right Ear: External ear normal.     Left Ear: External ear normal.  Eyes:     Pupils: Pupils are equal, round, and reactive to light.  Neck:     Thyroid: No thyromegaly.  Cardiovascular:     Rate and Rhythm: Normal rate and regular rhythm.  Pulmonary:     Effort: Pulmonary effort is normal. No respiratory distress.     Breath sounds: Normal breath sounds. No wheezing or rales.  Musculoskeletal:     Cervical back: Neck supple.  Neurological:     Mental Status: He is alert and oriented to person, place, and time.        Assessment:     #1 hypertension stable.  We obtained normal blood pressure readings here today.  No orthostatic change.  Supine blood pressure 130/88 with pulse of 78 and standing 130/80 with same pulse  #2 intermittent dizziness and near syncopal type symptoms.  He differentiates his dizziness clearly from vertigo.  No demonstrated drop in blood pressure.  Previous nuclear stress test Mar 19, 2019 unremarkable  #3 hx of 4.3 cm ascending aortic aneurysm by previous CT and due for follow up by CTA or MRA.    Plan:     -Set up cardiac event monitor. -Consider echocardiogram if symptoms persist -Change positions slowly -We will have someone call to see regarding his CT angiogram of the chest aorta which is already been ordered  Eulas Post MD [facility] at [facility]

## 2020-01-10 NOTE — Telephone Encounter (Signed)
Enrolled patient for a 30 day [employer] Event Monitor to be mailed to patients home. Brief instructions were gone over with the patient and he knows to expect the monitor to arrive in 5-7 days.

## 2020-01-10 NOTE — Patient Instructions (Signed)
Drink more water  We will set up cardiac event monitor  Your CT angiogram has been ordered and I will check to see why not done yet.

## 2020-01-21 ENCOUNTER — Encounter (INDEPENDENT_AMBULATORY_CARE_PROVIDER_SITE_OTHER): Payer: Medicare Other

## 2020-01-21 DIAGNOSIS — R55 Syncope and collapse: Secondary | ICD-10-CM | POA: Diagnosis not present

## 2020-02-24 ENCOUNTER — Other Ambulatory Visit: Payer: Self-pay

## 2020-02-24 ENCOUNTER — Ambulatory Visit (INDEPENDENT_AMBULATORY_CARE_PROVIDER_SITE_OTHER): Payer: Medicare Other | Admitting: Family Medicine

## 2020-02-24 ENCOUNTER — Encounter: Payer: Self-pay | Admitting: Family Medicine

## 2020-02-24 VITALS — BP 122/64 | HR 71 | Temp 98.2°F | Wt 245.5 lb

## 2020-02-24 DIAGNOSIS — R739 Hyperglycemia, unspecified: Secondary | ICD-10-CM | POA: Diagnosis not present

## 2020-02-24 DIAGNOSIS — R002 Palpitations: Secondary | ICD-10-CM

## 2020-02-24 DIAGNOSIS — R7303 Prediabetes: Secondary | ICD-10-CM

## 2020-02-24 DIAGNOSIS — I7121 Aneurysm of the ascending aorta, without rupture: Secondary | ICD-10-CM

## 2020-02-24 DIAGNOSIS — I712 Thoracic aortic aneurysm, without rupture: Secondary | ICD-10-CM

## 2020-02-24 LAB — GLUCOSE, POCT (MANUAL RESULT ENTRY): POC Glucose: 112 mg/dl — AB (ref 70–99)

## 2020-02-24 LAB — POCT GLYCOSYLATED HEMOGLOBIN (HGB A1C): Hemoglobin A1C: 5.7 % — AB (ref 4.0–5.6)

## 2020-02-24 NOTE — Patient Instructions (Signed)
CT angiogram of chest to reassess ascending thoracic aneurysm. [id number].    Call to set up.    I will also have our scheduler check into this.  Let me know in one week if have not heard about heart monitor.   Prediabetes Eating Plan Prediabetes is a condition that causes blood sugar (glucose) levels to be higher than normal. This increases the risk for developing diabetes. In order to prevent diabetes from developing, your health care provider may recommend a diet and other lifestyle changes to help you:  Control your blood glucose levels.  Improve your cholesterol levels.  Manage your blood pressure. Your health care provider may recommend working with a diet and nutrition specialist (dietitian) to make a meal plan that is best for you. What are tips for following this plan? Lifestyle  Set weight loss goals with the help of your health care team. It is recommended that most people with prediabetes lose 7% of their current body weight.  Exercise for at least 30 minutes at least 5 days a week.  Attend a support group or seek ongoing support from a mental health counselor.  Take over-the-counter and prescription medicines only as told by your health care provider. Reading food labels  Read food labels to check the amount of fat, salt (sodium), and sugar in prepackaged foods. Avoid foods that have: ? Saturated fats. ? Trans fats. ? Added sugars.  Avoid foods that have more than 300 milligrams (mg) of sodium per serving. Limit your daily sodium intake to less than 2,300 mg each day. Shopping  Avoid buying pre-made and processed foods. Cooking  Cook with olive oil. Do not use butter, lard, or ghee.  Bake, broil, grill, or boil foods. Avoid frying. Meal planning   Work with your dietitian to develop an eating plan that is right for you. This may include: ? Tracking how many calories you take in. Use a food diary, notebook, or mobile application to track what you eat at each  meal. ? Using the glycemic index (GI) to plan your meals. The index tells you how quickly a food will raise your blood glucose. Choose low-GI foods. These foods take a longer time to raise blood glucose.  Consider following a Mediterranean diet. This diet includes: ? Several servings each day of fresh fruits and vegetables. ? Eating fish at least twice a week. ? Several servings each day of whole grains, beans, nuts, and seeds. ? Using olive oil instead of other fats. ? Moderate alcohol consumption. ? Eating small amounts of red meat and whole-fat dairy.  If you have high blood pressure, you may need to limit your sodium intake or follow a diet such as the DASH eating plan. DASH is an eating plan that aims to lower high blood pressure. What foods are recommended? The items listed below may not be a complete list. Talk with your dietitian about what dietary choices are best for you. Grains Whole grains, such as whole-wheat or whole-grain breads, crackers, cereals, and pasta. Unsweetened oatmeal. Bulgur. Barley. Quinoa. Brown rice. Corn or whole-wheat flour tortillas or taco shells. Vegetables Lettuce. Spinach. Peas. Beets. Cauliflower. Cabbage. Broccoli. Carrots. Tomatoes. Squash. Eggplant. Herbs. Peppers. Onions. Cucumbers. Brussels sprouts. Fruits Berries. Bananas. Apples. Oranges. Grapes. Papaya. Mango. Pomegranate. Kiwi. Grapefruit. Cherries. Meats and other protein foods Seafood. Poultry without skin. Lean cuts of pork and beef. Tofu. Eggs. Nuts. Beans. Dairy Low-fat or fat-free dairy products, such as yogurt, cottage cheese, and cheese. Beverages Water. Tea. Coffee. Sugar-free or  diet soda. Seltzer water. Lowfat or no-fat milk. Milk alternatives, such as soy or almond milk. Fats and oils Olive oil. Canola oil. Sunflower oil. Grapeseed oil. Avocado. Walnuts. Sweets and desserts Sugar-free or low-fat pudding. Sugar-free or low-fat ice cream and other frozen treats. Seasoning and  other foods Herbs. Sodium-free spices. Mustard. Relish. Low-fat, low-sugar ketchup. Low-fat, low-sugar barbecue sauce. Low-fat or fat-free mayonnaise. What foods are not recommended? The items listed below may not be a complete list. Talk with your dietitian about what dietary choices are best for you. Grains Refined white flour and flour products, such as bread, pasta, snack foods, and cereals. Vegetables Canned vegetables. Frozen vegetables with butter or cream sauce. Fruits Fruits canned with syrup. Meats and other protein foods Fatty cuts of meat. Poultry with skin. Breaded or fried meat. Processed meats. Dairy Full-fat yogurt, cheese, or milk. Beverages Sweetened drinks, such as sweet iced tea and soda. Fats and oils Butter. Lard. Ghee. Sweets and desserts Baked goods, such as cake, cupcakes, pastries, cookies, and cheesecake. Seasoning and other foods Spice mixes with added salt. Ketchup. Barbecue sauce. Mayonnaise. Summary  To prevent diabetes from developing, you may need to make diet and other lifestyle changes to help control blood sugar, improve cholesterol levels, and manage your blood pressure.  Set weight loss goals with the help of your health care team. It is recommended that most people with prediabetes lose 7 percent of their current body weight.  Consider following a Mediterranean diet that includes plenty of fresh fruits and vegetables, whole grains, beans, nuts, seeds, fish, lean meat, low-fat dairy, and healthy oils. This information is not intended to replace advice given to you by your health care provider. Make sure you discuss any questions you have with your health care provider. Document Revised: 02/15/2019 Document Reviewed: 12/28/2016 Elsevier Patient Education  2020 [employer].

## 2020-02-24 NOTE — Progress Notes (Signed)
Subjective:     Patient ID: Paul Townsend, male   DOB: [birth year], 74 y.o.   MRN: [id number]  HPI   Paul Townsend is here to discuss several issues as follows  He has history of prediabetes range blood sugars. He has done excellent job with restricting calories and has lost 9 lbs in January. He has done this partly through reducing his wine intake. He had been drinking wine most nights with meal and has scaled back considerably. No polyuria or polydipsia. Fasting glucose today 112 with A1c 5.7%  Recent increased palpitations and intermittent dizziness with near syncopal type symptoms as per prior note dated 01/10/2020. He had no further symptoms since then. We sent him for event monitor and he turned this in last week. We have not gotten final results yet. Nuclear stress test May 2020 unremarkable.  He has history of 4.3 cm a sending thoracic aortic aneurysm and is due for repeat imaging. We had ordered CT angiogram of chest back in January of is not her anything back yet regarding that. No recent chest pains.  Past Medical History:  Diagnosis Date  . Arthritis   . Hyperlipidemia   . Hypertension    Past Surgical History:  Procedure Laterality Date  . ORIF Rt femur Right 01/05/2014  . TONSILLECTOMY    . TOOTH EXTRACTION     3 teeth  . TOTAL KNEE ARTHROPLASTY    . VARICOSE VEIN SURGERY      reports that he has quit smoking. He has never used smokeless tobacco. He reports current alcohol use. No history on file for drug. family history includes Arthritis in his father; Cancer in his mother; Heart disease (age of onset: 16) in his father; Hypertension in his father. No Known Allergies  Wt Readings from Last 3 Encounters:  02/24/20 245 lb 8 oz (111.4 kg)  01/10/20 243 lb 12.8 oz (110.6 kg)  11/26/19 254 lb 4.8 oz (115.3 kg)       Review of Systems  Constitutional: Negative for appetite change and fatigue.  Eyes: Negative for visual disturbance.  Respiratory: Negative for cough,  chest tightness and shortness of breath.   Cardiovascular: Negative for chest pain, palpitations and leg swelling.  Endocrine: Negative for polydipsia and polyuria.  Neurological: Negative for dizziness, syncope, weakness, light-headedness and headaches.       Objective:   Physical Exam Constitutional:      Appearance: He is well-developed.  HENT:     Right Ear: External ear normal.     Left Ear: External ear normal.  Eyes:     Pupils: Pupils are equal, round, and reactive to light.  Neck:     Thyroid: No thyromegaly.  Cardiovascular:     Rate and Rhythm: Normal rate and regular rhythm.  Pulmonary:     Effort: Pulmonary effort is normal. No respiratory distress.     Breath sounds: Normal breath sounds. No wheezing or rales.  Musculoskeletal:     Cervical back: Neck supple.     Right lower leg: No edema.     Left lower leg: No edema.  Neurological:     Mental Status: He is alert and oriented to person, place, and time.        Assessment:     #1 prediabetes. Blood sugars improved with recent weight loss. Glucose today 112 with A1c 5.7%  #2 history of near syncope and palpitations. Event monitor results pending  #3  4.3 cm ascending thoracic aortic aneurysm due for follow-up  Plan:     -Continue healthy lifestyle changes. We would like to see him drop another 8 or 10 lbs. Low glycemic diet. Check A1c at least yearly with physicals  -We have instructed that he call us in 1 week if we have not gotten results yet of his event monitor  -We are checking into his CT angiogram of the chest to see why this has not been set up yet  Paul Post MD [facility] at [facility]

## 2020-03-10 ENCOUNTER — Ambulatory Visit
Admission: RE | Admit: 2020-03-10 | Discharge: 2020-03-10 | Disposition: A | Discharge status: Home / Self Care | Payer: Medicare Other | Source: Ambulatory Visit | Attending: Family Medicine | Admitting: Family Medicine

## 2020-03-10 DIAGNOSIS — I712 Thoracic aortic aneurysm, without rupture, unspecified: Secondary | ICD-10-CM

## 2020-03-10 MED ORDER — IOPAMIDOL (ISOVUE-370) INJECTION 76%
75.0000 mL | Freq: Once | INTRAVENOUS | Status: AC | PRN
  Administered 2020-03-10: 12:00:00 75 mL via INTRAVENOUS

## 2020-04-07 ENCOUNTER — Telehealth: Payer: Self-pay | Admitting: Family Medicine

## 2020-04-07 MED ORDER — HYDROCHLOROTHIAZIDE 12.5 MG PO CAPS: 12.5000 mg | ORAL_CAPSULE | Freq: Every day | ORAL | 1 refills | Status: DC

## 2020-04-07 MED ORDER — LISINOPRIL 10 MG PO TABS: ORAL_TABLET | ORAL | 2 refills | Status: DC

## 2020-04-07 MED ORDER — EZETIMIBE-SIMVASTATIN 10-20 MG PO TABS: 1.0000 | ORAL_TABLET | Freq: Every day | ORAL | 1 refills | Status: DC

## 2020-04-07 NOTE — Telephone Encounter (Signed)
Refill sent in

## 2020-04-07 NOTE — Telephone Encounter (Signed)
Pt would like refill on 3 medication: Pt had AWV in Jan.  Ezetimibe-Simvastatin 10-20 mg Hydrochlorothiazide 12.5 mg Lisinopril 10mg   Send to [facility]

## 2020-04-07 NOTE — Addendum Note (Signed)
Addended by: Modena Morrow R on: 04/07/2020 10:27 AM   Modules accepted: Orders

## 2020-04-14 DIAGNOSIS — S72461K Displaced supracondylar fracture with intracondylar extension of lower end of right femur, subsequent encounter for closed fracture with nonunion: Secondary | ICD-10-CM | POA: Diagnosis not present

## 2020-04-14 DIAGNOSIS — I999 Unspecified disorder of circulatory system: Secondary | ICD-10-CM | POA: Diagnosis not present

## 2020-04-14 DIAGNOSIS — M85861 Other specified disorders of bone density and structure, right lower leg: Secondary | ICD-10-CM | POA: Diagnosis not present

## 2020-04-14 DIAGNOSIS — M25461 Effusion, right knee: Secondary | ICD-10-CM | POA: Diagnosis not present

## 2020-04-14 DIAGNOSIS — Z96653 Presence of artificial knee joint, bilateral: Secondary | ICD-10-CM | POA: Diagnosis not present

## 2020-04-14 DIAGNOSIS — Z96651 Presence of right artificial knee joint: Secondary | ICD-10-CM | POA: Diagnosis not present

## 2020-04-14 DIAGNOSIS — M25462 Effusion, left knee: Secondary | ICD-10-CM | POA: Diagnosis not present

## 2020-04-14 DIAGNOSIS — M85862 Other specified disorders of bone density and structure, left lower leg: Secondary | ICD-10-CM | POA: Diagnosis not present

## 2020-04-14 DIAGNOSIS — Z96652 Presence of left artificial knee joint: Secondary | ICD-10-CM | POA: Diagnosis not present

## 2020-04-14 DIAGNOSIS — Z471 Aftercare following joint replacement surgery: Secondary | ICD-10-CM | POA: Diagnosis not present

## 2020-08-12 DIAGNOSIS — H18513 Endothelial corneal dystrophy, bilateral: Secondary | ICD-10-CM | POA: Diagnosis not present

## 2020-08-12 DIAGNOSIS — H52203 Unspecified astigmatism, bilateral: Secondary | ICD-10-CM | POA: Diagnosis not present

## 2020-08-12 DIAGNOSIS — Z961 Presence of intraocular lens: Secondary | ICD-10-CM | POA: Diagnosis not present

## 2020-08-12 DIAGNOSIS — H35372 Puckering of macula, left eye: Secondary | ICD-10-CM | POA: Diagnosis not present

## 2020-08-13 ENCOUNTER — Other Ambulatory Visit: Payer: Self-pay

## 2020-08-13 ENCOUNTER — Ambulatory Visit (INDEPENDENT_AMBULATORY_CARE_PROVIDER_SITE_OTHER): Payer: Medicare Other

## 2020-08-13 DIAGNOSIS — Z23 Encounter for immunization: Secondary | ICD-10-CM | POA: Diagnosis not present

## 2020-09-07 ENCOUNTER — Ambulatory Visit: Payer: Medicare Other | Admitting: Family Medicine

## 2020-09-07 ENCOUNTER — Other Ambulatory Visit: Payer: Self-pay

## 2020-09-07 ENCOUNTER — Ambulatory Visit (INDEPENDENT_AMBULATORY_CARE_PROVIDER_SITE_OTHER): Payer: Medicare Other | Admitting: Family Medicine

## 2020-09-07 ENCOUNTER — Encounter: Payer: Self-pay | Admitting: Family Medicine

## 2020-09-07 VITALS — BP 120/74 | HR 85 | Temp 98.1°F | Wt 248.6 lb

## 2020-09-07 DIAGNOSIS — M545 Low back pain, unspecified: Secondary | ICD-10-CM

## 2020-09-07 DIAGNOSIS — M1990 Unspecified osteoarthritis, unspecified site: Secondary | ICD-10-CM | POA: Diagnosis not present

## 2020-09-07 NOTE — Patient Instructions (Addendum)
You can use Biofreeze, Aspercreme, icy hot as needed for your back.  Continue heat, ice, massage, stretching.  Arthritis Arthritis is a term that is commonly used to refer to joint pain or joint disease. There are more than 100 types of arthritis. What are the causes? The most common cause of this condition is wear and tear of a joint. Other causes include:  Gout.  Inflammation of a joint.  An infection of a joint.  Sprains and other injuries near the joint.  A reaction to medicines or drugs, or an allergic reaction. In some cases, the cause may not be known. What are the signs or symptoms? The main symptom of this condition is pain in the joint during movement. Other symptoms include:  Redness, swelling, or stiffness at a joint.  Warmth coming from the joint.  Fever.  Overall feeling of illness. How is this diagnosed? This condition may be diagnosed with a physical exam and tests, including:  Blood tests.  Urine tests.  Imaging tests, such as X-rays, an MRI, or a CT scan. Sometimes, fluid is removed from a joint for testing. How is this treated? This condition may be treated with:  Treatment of the cause, if it is known.  Rest.  Raising (elevating) the joint.  Applying cold or hot packs to the joint.  Medicines to improve symptoms and reduce inflammation.  Injections of a steroid such as cortisone into the joint to help reduce pain and inflammation. Depending on the cause of your arthritis, you may need to make lifestyle changes to reduce stress on your joint. Changes may include:  Exercising more.  Losing weight. Follow these instructions at home: Medicines  Take over-the-counter and prescription medicines only as told by your health care provider.  Do not take aspirin to relieve pain if your health care provider thinks that gout may be causing your pain. Activity  Rest your joint if told by your health care provider. Rest is important when your  disease is active and your joint feels painful, swollen, or stiff.  Avoid activities that make the pain worse. It is important to balance activity with rest.  Exercise your joint regularly with range-of-motion exercises as told by your health care provider. Try doing low-impact exercise, such as: ? Swimming. ? Water aerobics. ? Biking. ? Walking. Managing pain, stiffness, and swelling      If directed, put ice on the joint. ? Put ice in a plastic bag. ? Place a towel between your skin and the bag. ? Leave the ice on for 20 minutes, 2-3 times per day.  If your joint is swollen, raise (elevate) it above the level of your heart if directed by your health care provider.  If your joint feels stiff in the morning, try taking a warm shower.  If directed, apply heat to the affected area as often as told by your health care provider. Use the heat source that your health care provider recommends, such as a moist heat pack or a heating pad. If you have diabetes, do not apply heat without permission from your health care provider. To apply heat: ? Place a towel between your skin and the heat source. ? Leave the heat on for 20-30 minutes. ? Remove the heat if your skin turns bright red. This is especially important if you are unable to feel pain, heat, or cold. You may have a greater risk of getting burned. General instructions  Do not use any products that contain nicotine or tobacco,  such as cigarettes, e-cigarettes, and chewing tobacco. If you need help quitting, ask your health care provider.  Keep all follow-up visits as told by your health care provider. This is important. Contact a health care provider if:  The pain gets worse.  You have a fever. Get help right away if:  You develop severe joint pain, swelling, or redness.  Many joints become painful and swollen.  You develop severe back pain.  You develop severe weakness in your leg.  You cannot control your bladder or  bowels. Summary  Arthritis is a term that is commonly used to refer to joint pain or joint disease. There are more than 100 types of arthritis.  The most common cause of this condition is wear and tear of a joint. Other causes include gout, inflammation or infection of the joint, sprains, or allergies.  Symptoms of this condition include redness, swelling, or stiffness of the joint. Other symptoms include warmth, fever, or feeling ill.  This condition is treated with rest, elevation, medicines, and applying cold or hot packs.  Follow your health care provider's instructions about medicines, activity, exercises, and other home care treatments. This information is not intended to replace advice given to you by your health care provider. Make sure you discuss any questions you have with your health care provider. Document Revised: 10/01/2018 Document Reviewed: 10/01/2018 Elsevier Patient Education  2020 [place] or Strain Rehab Ask your health care provider which exercises are safe for you. Do exercises exactly as told by your health care provider and adjust them as directed. It is normal to feel mild stretching, pulling, tightness, or discomfort as you do these exercises. Stop right away if you feel sudden pain or your pain gets worse. Do not begin these exercises until told by your health care provider. Stretching and range-of-motion exercises These exercises warm up your muscles and joints and improve the movement and flexibility of your back. These exercises also help to relieve pain, numbness, and tingling. Lumbar rotation  1. Lie on your back on a firm surface and bend your knees. 2. Straighten your arms out to your sides so each arm forms a 90-degree angle (right angle) with a side of your body. 3. Slowly move (rotate) both of your knees to one side of your body until you feel a stretch in your lower back (lumbar). Try not to let your shoulders lift off the  floor. 4. Hold this position for __________ seconds. 5. Tense your abdominal muscles and slowly move your knees back to the starting position. 6. Repeat this exercise on the other side of your body. Repeat __________ times. Complete this exercise __________ times a day. Single knee to chest  1. Lie on your back on a firm surface with both legs straight. 2. Bend one of your knees. Use your hands to move your knee up toward your chest until you feel a gentle stretch in your lower back and buttock. ? Hold your leg in this position by holding on to the front of your knee. ? Keep your other leg as straight as possible. 3. Hold this position for __________ seconds. 4. Slowly return to the starting position. 5. Repeat with your other leg. Repeat __________ times. Complete this exercise __________ times a day. Prone extension on elbows  1. Lie on your abdomen on a firm surface (prone position). 2. Prop yourself up on your elbows. 3. Use your arms to help lift your chest up until you feel  a gentle stretch in your abdomen and your lower back. ? This will place some of your body weight on your elbows. If this is uncomfortable, try stacking pillows under your chest. ? Your hips should stay down, against the surface that you are lying on. Keep your hip and back muscles relaxed. 4. Hold this position for __________ seconds. 5. Slowly relax your upper body and return to the starting position. Repeat __________ times. Complete this exercise __________ times a day. Strengthening exercises These exercises build strength and endurance in your back. Endurance is the ability to use your muscles for a long time, even after they get tired. Pelvic tilt This exercise strengthens the muscles that lie deep in the abdomen. 1. Lie on your back on a firm surface. Bend your knees and keep your feet flat on the floor. 2. Tense your abdominal muscles. Tip your pelvis up toward the ceiling and flatten your lower back  into the floor. ? To help with this exercise, you may place a small towel under your lower back and try to push your back into the towel. 3. Hold this position for __________ seconds. 4. Let your muscles relax completely before you repeat this exercise. Repeat __________ times. Complete this exercise __________ times a day. Alternating arm and leg raises  1. Get on your hands and knees on a firm surface. If you are on a hard floor, you may want to use padding, such as an exercise mat, to cushion your knees. 2. Line up your arms and legs. Your hands should be directly below your shoulders, and your knees should be directly below your hips. 3. Lift your left leg behind you. At the same time, raise your right arm and straighten it in front of you. ? Do not lift your leg higher than your hip. ? Do not lift your arm higher than your shoulder. ? Keep your abdominal and back muscles tight. ? Keep your hips facing the ground. ? Do not arch your back. ? Keep your balance carefully, and do not hold your breath. 4. Hold this position for __________ seconds. 5. Slowly return to the starting position. 6. Repeat with your right leg and your left arm. Repeat __________ times. Complete this exercise __________ times a day. Abdominal set with straight leg raise  1. Lie on your back on a firm surface. 2. Bend one of your knees and keep your other leg straight. 3. Tense your abdominal muscles and lift your straight leg up, 4-6 inches (10-15 cm) off the ground. 4. Keep your abdominal muscles tight and hold this position for __________ seconds. ? Do not hold your breath. ? Do not arch your back. Keep it flat against the ground. 5. Keep your abdominal muscles tense as you slowly lower your leg back to the starting position. 6. Repeat with your other leg. Repeat __________ times. Complete this exercise __________ times a day. Single leg lower with bent knees 1. Lie on your back on a firm surface. 2. Tense  your abdominal muscles and lift your feet off the floor, one foot at a time, so your knees and hips are bent in 90-degree angles (right angles). ? Your knees should be over your hips and your lower legs should be parallel to the floor. 3. Keeping your abdominal muscles tense and your knee bent, slowly lower one of your legs so your toe touches the ground. 4. Lift your leg back up to return to the starting position. ? Do not hold your  breath. ? Do not let your back arch. Keep your back flat against the ground. 5. Repeat with your other leg. Repeat __________ times. Complete this exercise __________ times a day. Posture and body mechanics Good posture and healthy body mechanics can help to relieve stress in your body's tissues and joints. Body mechanics refers to the movements and positions of your body while you do your daily activities. Posture is part of body mechanics. Good posture means:  Your spine is in its natural S-curve position (neutral).  Your shoulders are pulled back slightly.  Your head is not tipped forward. Follow these guidelines to improve your posture and body mechanics in your everyday activities. Standing   When standing, keep your spine neutral and your feet about hip width apart. Keep a slight bend in your knees. Your ears, shoulders, and hips should line up.  When you do a task in which you stand in one place for a long time, place one foot up on a stable object that is 2-4 inches (5-10 cm) high, such as a footstool. This helps keep your spine neutral. Sitting   When sitting, keep your spine neutral and keep your feet flat on the floor. Use a footrest, if necessary, and keep your thighs parallel to the floor. Avoid rounding your shoulders, and avoid tilting your head forward.  When working at a desk or a computer, keep your desk at a height where your hands are slightly lower than your elbows. Slide your chair under your desk so you are close enough to maintain good  posture.  When working at a computer, place your monitor at a height where you are looking straight ahead and you do not have to tilt your head forward or downward to look at the screen. Resting  When lying down and resting, avoid positions that are most painful for you.  If you have pain with activities such as sitting, bending, stooping, or squatting, lie in a position in which your body does not bend very much. For example, avoid curling up on your side with your arms and knees near your chest (fetal position).  If you have pain with activities such as standing for a long time or reaching with your arms, lie with your spine in a neutral position and bend your knees slightly. Try the following positions: ? Lying on your side with a pillow between your knees. ? Lying on your back with a pillow under your knees. Lifting   When lifting objects, keep your feet at least shoulder width apart and tighten your abdominal muscles.  Bend your knees and hips and keep your spine neutral. It is important to lift using the strength of your legs, not your back. Do not lock your knees straight out.  Always ask for help to lift heavy or awkward objects. This information is not intended to replace advice given to you by your health care provider. Make sure you discuss any questions you have with your health care provider. Document Revised: 02/15/2019 Document Reviewed: 11/15/2018 Elsevier Patient Education  [place].  Back Exercises The following exercises strengthen the muscles that help to support the trunk and back. They also help to keep the lower back flexible. Doing these exercises can help to prevent back pain or lessen existing pain.  If you have back pain or discomfort, try doing these exercises 2-3 times each day or as told by your health care provider.  As your pain improves, do them once each day, but  increase the number of times that you repeat the steps for each exercise (do more  repetitions).  To prevent the recurrence of back pain, continue to do these exercises once each day or as told by your health care provider. Do exercises exactly as told by your health care provider and adjust them as directed. It is normal to feel mild stretching, pulling, tightness, or discomfort as you do these exercises, but you should stop right away if you feel sudden pain or your pain gets worse. Exercises Single knee to chest Repeat these steps 3-5 times for each leg: 1. Lie on your back on a firm bed or the floor with your legs extended. 2. Bring one knee to your chest. Your other leg should stay extended and in contact with the floor. 3. Hold your knee in place by grabbing your knee or thigh with both hands and hold. 4. Pull on your knee until you feel a gentle stretch in your lower back or buttocks. 5. Hold the stretch for 10-30 seconds. 6. Slowly release and straighten your leg. Pelvic tilt Repeat these steps 5-10 times: 1. Lie on your back on a firm bed or the floor with your legs extended. 2. Bend your knees so they are pointing toward the ceiling and your feet are flat on the floor. 3. Tighten your lower abdominal muscles to press your lower back against the floor. This motion will tilt your pelvis so your tailbone points up toward the ceiling instead of pointing to your feet or the floor. 4. With gentle tension and even breathing, hold this position for 5-10 seconds. Cat-cow Repeat these steps until your lower back becomes more flexible: 1. Get into a hands-and-knees position on a firm surface. Keep your hands under your shoulders, and keep your knees under your hips. You may place padding under your knees for comfort. 2. Let your head hang down toward your chest. Contract your abdominal muscles and point your tailbone toward the floor so your lower back becomes rounded like the back of a cat. 3. Hold this position for 5 seconds. 4. Slowly lift your head, let your abdominal  muscles relax and point your tailbone up toward the ceiling so your back forms a sagging arch like the back of a cow. 5. Hold this position for 5 seconds.  Press-ups Repeat these steps 5-10 times: 1. Lie on your abdomen (face-down) on the floor. 2. Place your palms near your head, about shoulder-width apart. 3. Keeping your back as relaxed as possible and keeping your hips on the floor, slowly straighten your arms to raise the top half of your body and lift your shoulders. Do not use your back muscles to raise your upper torso. You may adjust the placement of your hands to make yourself more comfortable. 4. Hold this position for 5 seconds while you keep your back relaxed. 5. Slowly return to lying flat on the floor.  Bridges Repeat these steps 10 times: 1. Lie on your back on a firm surface. 2. Bend your knees so they are pointing toward the ceiling and your feet are flat on the floor. Your arms should be flat at your sides, next to your body. 3. Tighten your buttocks muscles and lift your buttocks off the floor until your waist is at almost the same height as your knees. You should feel the muscles working in your buttocks and the back of your thighs. If you do not feel these muscles, slide your feet 1-2 inches farther away  from your buttocks. 4. Hold this position for 3-5 seconds. 5. Slowly lower your hips to the starting position, and allow your buttocks muscles to relax completely. If this exercise is too easy, try doing it with your arms crossed over your chest. Abdominal crunches Repeat these steps 5-10 times: 1. Lie on your back on a firm bed or the floor with your legs extended. 2. Bend your knees so they are pointing toward the ceiling and your feet are flat on the floor. 3. Cross your arms over your chest. 4. Tip your chin slightly toward your chest without bending your neck. 5. Tighten your abdominal muscles and slowly raise your trunk (torso) high enough to lift your shoulder  blades a tiny bit off the floor. Avoid raising your torso higher than that because it can put too much stress on your low back and does not help to strengthen your abdominal muscles. 6. Slowly return to your starting position. Back lifts Repeat these steps 5-10 times: 1. Lie on your abdomen (face-down) with your arms at your sides, and rest your forehead on the floor. 2. Tighten the muscles in your legs and your buttocks. 3. Slowly lift your chest off the floor while you keep your hips pressed to the floor. Keep the back of your head in line with the curve in your back. Your eyes should be looking at the floor. 4. Hold this position for 3-5 seconds. 5. Slowly return to your starting position. Contact a health care provider if:  Your back pain or discomfort gets much worse when you do an exercise.  Your worsening back pain or discomfort does not lessen within 2 hours after you exercise. If you have any of these problems, stop doing these exercises right away. Do not do them again unless your health care provider says that you can. Get help right away if:  You develop sudden, severe back pain. If this happens, stop doing the exercises right away. Do not do them again unless your health care provider says that you can. This information is not intended to replace advice given to you by your health care provider. Make sure you discuss any questions you have with your health care provider. Document Revised: 02/28/2019 Document Reviewed: 07/26/2018 Elsevier Patient Education  [place].

## 2020-09-07 NOTE — Progress Notes (Signed)
Subjective:    Patient ID: Paul Townsend, male    DOB: 1946-09-17, 74 y.o.   MRN: [id number]  No chief complaint on file.   HPI  Patient was seen today for acute concern.  Patient endorses low back pain bilaterally x10 days, improving.  Patient states symptoms started one night while up to use the restroom.  Patient states pain became so severe with standing and sitting that he had to use a walker for a few days.  Patient has seen his chiropractor 6 times since the pain started.  Patient using ice, heat, attempting stretches for his symptoms.  Patient denies any strenuous activity, pushing, pulling.  Patient was walking 4 miles per day for exercise.  States tennis shoes he wears for exercise for over a year old/slightly worn.  Patient denies numbness, tingling, weakness in lower extremities, fever, chills, dysuria.  Social: Patient is retired Corporate treasurer, previously worked in [employer].  Patient was stationed in [place] x2 and jumped out of planes.  He has a PhD and is currently writing a book.  Past Medical History:  Diagnosis Date  . Arthritis   . Hyperlipidemia   . Hypertension     No Known Allergies  ROS General: Denies fever, chills, night sweats, changes in weight, changes in appetite HEENT: Denies headaches, ear pain, changes in vision, rhinorrhea, sore throat CV: Denies CP, palpitations, SOB, orthopnea Pulm: Denies SOB, cough, wheezing GI: Denies abdominal pain, nausea, vomiting, diarrhea, constipation GU: Denies dysuria, hematuria, frequency, vaginal discharge Msk: Denies muscle cramps, joint pains  + bilateral low back pain Neuro: Denies weakness, numbness, tingling Skin: Denies rashes, bruising Psych: Denies depression, anxiety, hallucinations     Objective:    Blood pressure 120/74, pulse 85, temperature 98.1 F (36.7 C), temperature source Oral, weight 248 lb 9.6 oz (112.8 kg), SpO2 96 %.   Gen. Pleasant, well-nourished, in no distress, normal affect   HEENT: [place]/AT, face  symmetric, conjunctiva clear, no scleral icterus, PERRLA, EOMI, nares patent without drainage Lungs: no accessory muscle use Cardiovascular: RRR, no peripheral edema Abdomen: BS present, soft, NT/ND, no hepatosplenomegaly. Musculoskeletal: No TTP of cervical, thoracic, lumbar spine midline.  TTP of right sided lumbar paraspinal muscles.  No deformities, no cyanosis or clubbing, normal tone Neuro:  A&Ox3, CN II-XII intact, normal gait Skin:  Warm, no lesions/ rash   Wt Readings from Last 3 Encounters:  09/07/20 248 lb 9.6 oz (112.8 kg)  02/24/20 245 lb 8 oz (111.4 kg)  01/10/20 243 lb 12.8 oz (110.6 kg)    Lab Results  Component Value Date   WBC 22.3 (H) 09/25/2018   HGB 16.8 09/25/2018   HCT 50.4 09/25/2018   PLT 214 09/25/2018   GLUCOSE 125 (H) 11/26/2019   CHOL 159 11/26/2019   TRIG 136.0 11/26/2019   HDL 58.40 11/26/2019   LDLDIRECT 114.7 08/19/2013   LDLCALC 74 11/26/2019   ALT 14 11/26/2019   AST 18 11/26/2019   NA 138 11/26/2019   K 4.2 11/26/2019   CL 100 11/26/2019   CREATININE 1.01 11/26/2019   BUN 13 11/26/2019   CO2 31 11/26/2019   TSH 2.20 11/26/2019   PSA 1.83 11/26/2019   INR 0.94 01/05/2014   HGBA1C 5.7 (A) 02/24/2020    Assessment/Plan:  Arthritis  Acute bilateral low back pain without sciatica -Patient advised symptoms likely 2/2 arthritis.  Also consider muscular strain-patient does not recall injury. -CT scan of abdomen and pelvis from 09/25/2018 reviewed.  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  Lower lumbar facet arthrosis.  Moderate to marked degenerative disc disease L1-L5.  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 -Discussed continuing supportive care including ice, heat, massage, stretching, topical analgesics, NSAIDs or Tylenol as needed -Okay to continue chiropractic therapy -For continued worsening symptoms consider prednisone taper -Advised to consider PT -Given handout  F/u as  needed  Grier Mitts, MD

## 2020-09-11 ENCOUNTER — Other Ambulatory Visit: Payer: Self-pay

## 2020-09-11 ENCOUNTER — Ambulatory Visit (INDEPENDENT_AMBULATORY_CARE_PROVIDER_SITE_OTHER): Payer: Medicare Other | Admitting: Family Medicine

## 2020-09-11 ENCOUNTER — Encounter: Payer: Self-pay | Admitting: Family Medicine

## 2020-09-11 VITALS — BP 140/82 | HR 72 | Ht 74.25 in | Wt 244.1 lb

## 2020-09-11 DIAGNOSIS — M545 Low back pain, unspecified: Secondary | ICD-10-CM | POA: Diagnosis not present

## 2020-09-11 MED ORDER — METHOCARBAMOL 500 MG PO TABS: 500.0000 mg | ORAL_TABLET | Freq: Three times a day (TID) | ORAL | 0 refills | Status: DC | PRN

## 2020-09-11 NOTE — Progress Notes (Signed)
Established Patient Office Visit  Subjective:  Patient ID: Paul Townsend, male    DOB: 1946/06/10  Age: 74 y.o. MRN: [id number]  CC:  Chief Complaint  Patient presents with   Back Pain    back pain x 2 week , no injury , using ice and heat, and couldn't walk , lower back      HPI Paul Townsend presents for follow-up regarding low back pain.  He states about 2 weeks ago in the absence of any recent injury he woke up on a Friday with a very stiff back which made it difficult even to get up from sitting.  He had difficulty ambulating.  He went to his chiropractor and has had about 6 visits since then.  He has been using some ice and Advil and has had some adjustments done and states he is about 90% better.  He is walking some but not back to his usual 3 to 4 miles per day.  His pain has consistently been right lower lumbar area.  He denies any fevers or chills.  He has had some intentional weight loss from exercise.  No fevers or chills.  No urine or stool incontinence.  He had previous CT abdomen pelvis which showed severe degenerative changes L1-L5.  He denies any radiculitis symptoms.  Past Medical History:  Diagnosis Date   Arthritis    Hyperlipidemia    Hypertension     Past Surgical History:  Procedure Laterality Date   ORIF Rt femur Right 01/05/2014   TONSILLECTOMY     TOOTH EXTRACTION     3 teeth   TOTAL KNEE ARTHROPLASTY     VARICOSE VEIN SURGERY      Family History  Problem Relation Age of Onset   Cancer Mother        colon   Arthritis Father    Hypertension Father    Heart disease Father 68       MI    Social History   Socioeconomic History   Marital status: Married    Spouse name: Not on file   Number of children: Not on file   Years of education: Not on file   Highest education level: Not on file  Occupational History   Not on file  Tobacco Use   Smoking status: Former Smoker   Smokeless tobacco: Never Used   Tobacco  comment: Did not review today due to time limits   Vaping Use   Vaping Use: Never used  Substance and Sexual Activity   Alcohol use: Yes    Comment: ooc   Drug use: Not on file   Sexual activity: Not on file  Other Topics Concern   Not on file  Social History Narrative   Not on file   Social Determinants of Health   Financial Resource Strain:    Difficulty of Paying Living Expenses: Not on file  Food Insecurity:    Worried About Running Out of Food in the Last Year: Not on file   [employer] of Food in the Last Year: Not on file  Transportation Needs:    Lack of Transportation (Medical): Not on file   Lack of Transportation (Non-Medical): Not on file  Physical Activity:    Days of Exercise per Week: Not on file   Minutes of Exercise per Session: Not on file  Stress:    Feeling of Stress : Not on file  Social Connections:    Frequency of Communication with Friends and  Family: Not on file   Frequency of Social Gatherings with Friends and Family: Not on file   Attends Religious Services: Not on file   Active Member of Clubs or Organizations: Not on file   Attends Archivist Meetings: Not on file   Marital Status: Not on file  Intimate Partner Violence:    Fear of Current or Ex-Partner: Not on file   Emotionally Abused: Not on file   Physically Abused: Not on file   Sexually Abused: Not on file    Outpatient Medications Prior to Visit  Medication Sig Dispense Refill   aspirin (GOODSENSE ASPIRIN) 81 MG chewable tablet Chew 81 mg by mouth.     ezetimibe-simvastatin (VYTORIN) 10-20 MG tablet Take 1 tablet by mouth at bedtime. 90 tablet 1   hydrochlorothiazide (MICROZIDE) 12.5 MG capsule Take 1 capsule (12.5 mg total) by mouth daily. 90 capsule 1   lisinopril (ZESTRIL) 10 MG tablet TAKE 1 TABLET DAILY 90 tablet 2   Misc Natural Products (TART CHERRY ADVANCED PO) Take 1 capsule by mouth daily.     omeprazole (PRILOSEC OTC) 20 MG tablet Take  by mouth.     Potassium 99 MG TABS Take 1 tablet by mouth daily.     sildenafil (VIAGRA) 100 MG tablet Take 1 tablet (100 mg total) by mouth daily as needed for erectile dysfunction. 6 tablet 11   Calcium Citrate-Vitamin D (CALCIUM + D PO) Take by mouth. (Patient not taking: Reported on 09/11/2020)     cyanocobalamin 1000 MCG tablet Take 1,000 mcg by mouth daily. (Patient not taking: Reported on 09/11/2020)     vitamin C (ASCORBIC ACID) 500 MG tablet Take 500 mg by mouth daily.   (Patient not taking: Reported on 09/11/2020)     Zinc Acetate, Oral, (ZINC ACETATE PO) Take by mouth. (Patient not taking: Reported on 09/11/2020)     No facility-administered medications prior to visit.    No Known Allergies  ROS Review of Systems  Constitutional: Negative for activity change, appetite change and fever.  Respiratory: Negative for cough and shortness of breath.   Cardiovascular: Negative for chest pain and leg swelling.  Gastrointestinal: Negative for abdominal pain and vomiting.  Genitourinary: Negative for dysuria, flank pain and hematuria.  Musculoskeletal: Positive for back pain. Negative for joint swelling.  Neurological: Negative for weakness and numbness.      Objective:    Physical Exam Constitutional:      Appearance: Normal appearance.  Cardiovascular:     Rate and Rhythm: Normal rate and regular rhythm.  Pulmonary:     Effort: Pulmonary effort is normal.     Breath sounds: Normal breath sounds.  Musculoskeletal:     Right lower leg: No edema.     Left lower leg: No edema.  Neurological:     Mental Status: He is alert.     Comments: Deep tendon reflexes are trace knee and ankle bilaterally.  Full strength with plantarflexion and dorsiflexion bilaterally.     BP 140/82 (BP Location: Left Arm, Patient Position: Sitting, Cuff Size: Normal)    Pulse 72    Ht 6' 2.25" (1.886 m)    Wt 244 lb 1.6 oz (110.7 kg)    SpO2 96%    BMI 31.13 kg/m  Wt Readings from Last 3  Encounters:  09/11/20 244 lb 1.6 oz (110.7 kg)  09/07/20 248 lb 9.6 oz (112.8 kg)  02/24/20 245 lb 8 oz (111.4 kg)     Health Maintenance Due  Topic Date Due   COVID-19 Vaccine (1) Never done    There are no preventive care reminders to display for this patient.  Lab Results  Component Value Date   TSH 2.20 11/26/2019   Lab Results  Component Value Date   WBC 22.3 (H) 09/25/2018   HGB 16.8 09/25/2018   HCT 50.4 09/25/2018   MCV 95.1 09/25/2018   PLT 214 09/25/2018   Lab Results  Component Value Date   NA 138 11/26/2019   K 4.2 11/26/2019   CO2 31 11/26/2019   GLUCOSE 125 (H) 11/26/2019   BUN 13 11/26/2019   CREATININE 1.01 11/26/2019   BILITOT 1.5 (H) 11/26/2019   ALKPHOS 39 11/26/2019   AST 18 11/26/2019   ALT 14 11/26/2019   PROT 6.8 11/26/2019   ALBUMIN 4.2 11/26/2019   CALCIUM 9.5 11/26/2019   ANIONGAP 12 09/25/2018   GFR 72.09 11/26/2019   Lab Results  Component Value Date   CHOL 159 11/26/2019   Lab Results  Component Value Date   HDL 58.40 11/26/2019   Lab Results  Component Value Date   LDLCALC 74 11/26/2019   Lab Results  Component Value Date   TRIG 136.0 11/26/2019   Lab Results  Component Value Date   CHOLHDL 3 11/26/2019   Lab Results  Component Value Date   HGBA1C 5.7 (A) 02/24/2020      Assessment & Plan:   Right lower lumbar back pain with onset 2 weeks ago as above.  Nonfocal neuro exam.  No focal weakness.  He is improving gradually with chiropractic treatment, ice, and Advil.  Still has some stiffness  -Wrote for limited Robaxin with caution for sedation.  He will take at night as needed. -Touch base if not continuing to improve over the next few weeks.  Also resume walking as tolerated  Meds ordered this encounter  Medications   methocarbamol (ROBAXIN) 500 MG tablet    Sig: Take 1 tablet (500 mg total) by mouth every 8 (eight) hours as needed for muscle spasms.    Dispense:  30 tablet    Refill:  0    Follow-up:  No follow-ups on file.    Carolann Littler, MD

## 2020-10-07 ENCOUNTER — Other Ambulatory Visit: Payer: Self-pay | Admitting: Family Medicine

## 2020-10-23 ENCOUNTER — Telehealth (INDEPENDENT_AMBULATORY_CARE_PROVIDER_SITE_OTHER): Payer: Medicare Other | Admitting: Family Medicine

## 2020-10-23 ENCOUNTER — Encounter: Payer: Self-pay | Admitting: Family Medicine

## 2020-10-23 ENCOUNTER — Other Ambulatory Visit: Payer: Self-pay

## 2020-10-23 VITALS — BP 136/70 | Temp 97.8°F

## 2020-10-23 DIAGNOSIS — J0120 Acute ethmoidal sinusitis, unspecified: Secondary | ICD-10-CM

## 2020-10-23 MED ORDER — AMOXICILLIN-POT CLAVULANATE 875-125 MG PO TABS: 1.0000 | ORAL_TABLET | Freq: Two times a day (BID) | ORAL | 0 refills | Status: DC

## 2020-10-23 NOTE — Progress Notes (Signed)
This visit type was conducted due to national recommendations for restrictions regarding the COVID-19 pandemic in an effort to limit this patient's exposure and mitigate transmission in our community.   Virtual Visit via Telephone Note  I connected with Paul Townsend on 10/23/20 at  1:45 PM EST by telephone and verified that I am speaking with the correct person using two identifiers.   I discussed the limitations, risks, security and privacy concerns of performing an evaluation and management service by telephone and the availability of in person appointments. I also discussed with the patient that there may be a patient responsible charge related to this service. The patient expressed understanding and agreed to proceed.  Location patient: home Location provider: work or home office Participants present for the call: patient, provider Patient did not have a visit in the prior 7 days to address this/these issue(s).   History of Present Illness:   Paul Townsend called with onset over a week ago some sinus congestive symptoms.  He has developed some right ethmoid pain with occasional headaches and right upper teeth pain.  No fever.  No chills.  Symptoms have been progressive.  No relief with over-the-counter medications.  He really has not had much in the way of sinus infections much previously.  He tried Advil and Sudafed without relief.  He did have some allergy type symptoms preceding this.  No sick exposures.  No cough or loss of taste or smell.  No dyspnea.  Past Medical History:  Diagnosis Date  . Arthritis   . Hyperlipidemia   . Hypertension    Past Surgical History:  Procedure Laterality Date  . ORIF Rt femur Right 01/05/2014  . TONSILLECTOMY    . TOOTH EXTRACTION     3 teeth  . TOTAL KNEE ARTHROPLASTY    . VARICOSE VEIN SURGERY      reports that he has quit smoking. He has never used smokeless tobacco. He reports current alcohol use. No history on file for drug use. family history  includes Arthritis in his father; Cancer in his mother; Heart disease (age of onset: 40) in his father; Hypertension in his father. No Known Allergies  Observations/Objective: Patient sounds cheerful and well on the phone. I do not appreciate any SOB. Speech and thought processing are grossly intact. Patient reported vitals:  Assessment and Plan:  Probable acute sinusitis involving right ethmoid sinus and possibly maxillary  -Start Augmentin 875 mg twice daily with food for 10 days -Plenty of fluids and rest and follow-up by next week if not improving  Follow Up Instructions:  -As above   [id number] 5-10 [id number] 11-20 [id number] 21-30 I did not refer this patient for an OV in the next 24 hours for this/these issue(s).  I discussed the assessment and treatment plan with the patient. The patient was provided an opportunity to ask questions and all were answered. The patient agreed with the plan and demonstrated an understanding of the instructions.   The patient was advised to call back or seek an in-person evaluation if the symptoms worsen or if the condition fails to improve as anticipated.  I provided 13 minutes of non-face-to-face time during this encounter.   Carolann Littler, MD

## 2020-11-27 ENCOUNTER — Other Ambulatory Visit: Payer: Self-pay

## 2020-11-30 ENCOUNTER — Other Ambulatory Visit: Payer: Self-pay

## 2020-11-30 ENCOUNTER — Encounter: Payer: Self-pay | Admitting: Family Medicine

## 2020-11-30 ENCOUNTER — Ambulatory Visit (INDEPENDENT_AMBULATORY_CARE_PROVIDER_SITE_OTHER): Payer: Medicare Other | Admitting: Family Medicine

## 2020-11-30 VITALS — BP 110/64 | HR 75 | Ht 74.0 in | Wt 242.0 lb

## 2020-11-30 DIAGNOSIS — R202 Paresthesia of skin: Secondary | ICD-10-CM

## 2020-11-30 DIAGNOSIS — I10 Essential (primary) hypertension: Secondary | ICD-10-CM | POA: Diagnosis not present

## 2020-11-30 DIAGNOSIS — E785 Hyperlipidemia, unspecified: Secondary | ICD-10-CM | POA: Diagnosis not present

## 2020-11-30 DIAGNOSIS — I712 Thoracic aortic aneurysm, without rupture, unspecified: Secondary | ICD-10-CM

## 2020-11-30 DIAGNOSIS — Z125 Encounter for screening for malignant neoplasm of prostate: Secondary | ICD-10-CM | POA: Diagnosis not present

## 2020-11-30 DIAGNOSIS — Z800 Family history of malignant neoplasm of digestive organs: Secondary | ICD-10-CM | POA: Insufficient documentation

## 2020-11-30 DIAGNOSIS — K219 Gastro-esophageal reflux disease without esophagitis: Secondary | ICD-10-CM | POA: Insufficient documentation

## 2020-11-30 DIAGNOSIS — R5383 Other fatigue: Secondary | ICD-10-CM | POA: Diagnosis not present

## 2020-11-30 DIAGNOSIS — Z0000 Encounter for general adult medical examination without abnormal findings: Secondary | ICD-10-CM

## 2020-11-30 DIAGNOSIS — K5730 Diverticulosis of large intestine without perforation or abscess without bleeding: Secondary | ICD-10-CM | POA: Insufficient documentation

## 2020-11-30 DIAGNOSIS — R7303 Prediabetes: Secondary | ICD-10-CM

## 2020-11-30 DIAGNOSIS — I7121 Aneurysm of the ascending aorta, without rupture: Secondary | ICD-10-CM

## 2020-11-30 LAB — HEPATIC FUNCTION PANEL
ALT: 14 U/L (ref 0–53)
AST: 17 U/L (ref 0–37)
Albumin: 4.5 g/dL (ref 3.5–5.2)
Alkaline Phosphatase: 45 U/L (ref 39–117)
Bilirubin, Direct: 0.3 mg/dL (ref 0.0–0.3)
Total Bilirubin: 1.9 mg/dL — ABNORMAL HIGH (ref 0.2–1.2)
Total Protein: 7 g/dL (ref 6.0–8.3)

## 2020-11-30 LAB — CBC WITH DIFFERENTIAL/PLATELET
Basophils Absolute: 0.1 10*3/uL (ref 0.0–0.1)
Basophils Relative: 0.8 % (ref 0.0–3.0)
Eosinophils Absolute: 0.3 10*3/uL (ref 0.0–0.7)
Eosinophils Relative: 4 % (ref 0.0–5.0)
HCT: 46.8 % (ref 39.0–52.0)
Hemoglobin: 16 g/dL (ref 13.0–17.0)
Lymphocytes Relative: 19 % (ref 12.0–46.0)
Lymphs Abs: 1.5 10*3/uL (ref 0.7–4.0)
MCHC: 34.2 g/dL (ref 30.0–36.0)
MCV: 94 fl (ref 78.0–100.0)
Monocytes Absolute: 0.7 10*3/uL (ref 0.1–1.0)
Monocytes Relative: 8.9 % (ref 3.0–12.0)
Neutro Abs: 5.2 10*3/uL (ref 1.4–7.7)
Neutrophils Relative %: 67.3 % (ref 43.0–77.0)
Platelets: 205 10*3/uL (ref 150.0–400.0)
RBC: 4.98 Mil/uL (ref 4.22–5.81)
RDW: 13.1 % (ref 11.5–15.5)
WBC: 7.7 10*3/uL (ref 4.0–10.5)

## 2020-11-30 LAB — PSA, MEDICARE: PSA: 2.36 ng/ml (ref 0.10–4.00)

## 2020-11-30 LAB — LIPID PANEL
Cholesterol: 145 mg/dL (ref 0–200)
HDL: 56.6 mg/dL (ref >39.00)
LDL Cholesterol: 65 mg/dL (ref 0–99)
NonHDL: 88.1
Total CHOL/HDL Ratio: 3
Triglycerides: 118 mg/dL (ref 0.0–149.0)
VLDL: 23.6 mg/dL (ref 0.0–40.0)

## 2020-11-30 LAB — BASIC METABOLIC PANEL
BUN: 15 mg/dL (ref 6–23)
CO2: 27 mEq/L (ref 19–32)
Calcium: 9.6 mg/dL (ref 8.4–10.5)
Chloride: 100 mEq/L (ref 96–112)
Creatinine, Ser: 0.91 mg/dL (ref 0.40–1.50)
GFR: 82.39 mL/min (ref >60.00)
Glucose, Bld: 110 mg/dL — ABNORMAL HIGH (ref 70–99)
Potassium: 4.1 mEq/L (ref 3.5–5.1)
Sodium: 136 mEq/L (ref 135–145)

## 2020-11-30 LAB — VITAMIN B12: Vitamin B-12: 524 pg/mL (ref 211–911)

## 2020-11-30 LAB — HEMOGLOBIN A1C: Hgb A1c MFr Bld: 6.3 % (ref 4.6–6.5)

## 2020-11-30 LAB — TSH: TSH: 1.75 u[IU]/mL (ref 0.35–4.50)

## 2020-11-30 MED ORDER — HYDROCHLOROTHIAZIDE 12.5 MG PO CAPS
12.5000 mg | ORAL_CAPSULE | Freq: Every day | ORAL | 3 refills | Status: DC
Start: 2020-11-30 — End: 2021-09-24

## 2020-11-30 MED ORDER — LISINOPRIL 10 MG PO TABS
ORAL_TABLET | ORAL | 3 refills | Status: DC
Start: 2020-11-30 — End: 2021-06-09

## 2020-11-30 MED ORDER — EZETIMIBE-SIMVASTATIN 10-20 MG PO TABS: 1.0000 | ORAL_TABLET | Freq: Every day | ORAL | 3 refills | Status: DC

## 2020-11-30 NOTE — Patient Instructions (Signed)
Preventive Care 76 Years and Older, Male Preventive care refers to lifestyle choices and visits with your health care provider that can promote health and wellness. This includes:  A yearly physical exam. This is also called an annual wellness visit.  Regular dental and eye exams.  Immunizations.  Screening for certain conditions.  Healthy lifestyle choices, such as: ? Eating a healthy diet. ? Getting regular exercise. ? Not using drugs or products that contain nicotine and tobacco. ? Limiting alcohol use. What can I expect for my preventive care visit? Physical exam Your health care provider will check your:  Height and weight. These may be used to calculate your BMI (body mass index). BMI is a measurement that tells if you are at a healthy weight.  Heart rate and blood pressure.  Body temperature.  Skin for abnormal spots. Counseling Your health care provider may ask you questions about your:  Past medical problems.  Family's medical history.  Alcohol, tobacco, and drug use.  Emotional well-being.  Home life and relationship well-being.  Sexual activity.  Diet, exercise, and sleep habits.  History of falls.  Memory and ability to understand (cognition).  Work and work environment.  Access to firearms. What immunizations do I need? Vaccines are usually given at various ages, according to a schedule. Your health care provider will recommend vaccines for you based on your age, medical history, and lifestyle or other factors, such as travel or where you work.   What tests do I need? Blood tests  Lipid and cholesterol levels. These may be checked every 5 years, or more often depending on your overall health.  Hepatitis C test.  Hepatitis B test. Screening  Lung cancer screening. You may have this screening every year starting at age 55 if you have a 30-pack-year history of smoking and currently smoke or have quit within the past 15 years.  Colorectal  cancer screening. ? All adults should have this screening starting at age 50 and continuing until age 75. ? Your health care provider may recommend screening at age 45 if you are at increased risk. ? You will have tests every 1-10 years, depending on your results and the type of screening test.  Prostate cancer screening. Recommendations will vary depending on your family history and other risks.  Genital exam to check for testicular cancer or hernias.  Diabetes screening. ? This is done by checking your blood sugar (glucose) after you have not eaten for a while (fasting). ? You may have this done every 1-3 years.  Abdominal aortic aneurysm (AAA) screening. You may need this if you are a current or former smoker.  STD (sexually transmitted disease) testing, if you are at risk. Follow these instructions at home: Eating and drinking  Eat a diet that includes fresh fruits and vegetables, whole grains, lean protein, and low-fat dairy products. Limit your intake of foods with high amounts of sugar, saturated fats, and salt.  Take vitamin and mineral supplements as recommended by your health care provider.  Do not drink alcohol if your health care provider tells you not to drink.  If you drink alcohol: ? Limit how much you have to 0-2 drinks a day. ? Be aware of how much alcohol is in your drink. In the [place] one drink equals one 12 oz bottle of beer (355 mL), one 5 oz glass of wine (148 mL), or one 1 oz glass of hard liquor (44 mL).   Lifestyle  Take daily care of your teeth   and gums. Brush your teeth every morning and night with fluoride toothpaste. Floss one time each day.  Stay active. Exercise for at least 30 minutes 5 or more days each week.  Do not use any products that contain nicotine or tobacco, such as cigarettes, e-cigarettes, and chewing tobacco. If you need help quitting, ask your health care provider.  Do not use drugs.  If you are sexually active, practice safe sex.  Use a condom or other form of protection to prevent STIs (sexually transmitted infections).  Talk with your health care provider about taking a low-dose aspirin or statin.  Find healthy ways to cope with stress, such as: ? Meditation, yoga, or listening to music. ? Journaling. ? Talking to a trusted person. ? Spending time with friends and family. Safety  Always wear your seat belt while driving or riding in a vehicle.  Do not drive: ? If you have been drinking alcohol. Do not ride with someone who has been drinking. ? When you are tired or distracted. ? While texting.  Wear a helmet and other protective equipment during sports activities.  If you have firearms in your house, make sure you follow all gun safety procedures. What's next?  Visit your health care provider once a year for an annual wellness visit.  Ask your health care provider how often you should have your eyes and teeth checked.  Stay up to date on all vaccines. This information is not intended to replace advice given to you by your health care provider. Make sure you discuss any questions you have with your health care provider. Document Revised: 07/23/2019 Document Reviewed: 10/18/2018 [employer] Patient Education  2021 [employer]  

## 2020-11-30 NOTE — Progress Notes (Signed)
Established Patient Office Visit  Subjective:  Patient ID: Paul Townsend, male    DOB: November 24, 1945  Age: 75 y.o. MRN: [id number]  CC:  Chief Complaint  Patient presents with  . Annual Exam    HPI Paul Townsend presents for subsequent annual Medicare wellness visit and medical follow-up.  He has history of hypertension, a sending aortic aneurysm, osteoarthritis involving multiple joints, history of prediabetes range blood sugars, hyperlipidemia.  Needs refills of several medications today including lisinopril, Microzide, and Vytorin.  He will be due for follow-up regarding annual screening for his thoracic aneurysm in May.  Last measurements were around 4.3 cm.  Denies any recent chest pains or dyspnea.  Generally doing well.  1.  Risk factors based on Past Medical , Social, and Family history reviewed and as indicated above with no changes He has not been traveling much.  He has children up in [place] and is seeing them during the past year.  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  Enjoys writing.  Walks about 3 miles per day for exercise  2.  Limitations in physical activities None.  No recent falls.  Exercise with walking as above about 3-1/2 miles per day  3.  Depression/mood No active depression or anxiety issues PHQ 2 equals 0  4.  Hearing No major deficits.  5.  ADLs independent in all.  6.  Cognitive function (orientation to time and place, language, writing, speech,memory) no short or long term memory issues.  Language and judgement intact.  7.  Home Safety no issues  8.  Height, weight, and visual acuity.all stable. Wt Readings from Last 3 Encounters:  11/30/20 242 lb (109.8 kg)  09/11/20 244 lb 1.6 oz (110.7 kg)  09/07/20 248 lb 9.6 oz (112.8 kg)    9.  Counseling discussed Counseled regarding age and gender appropriate preventative screenings and immunizations.  10. Recommendation of preventive services.  Vaccines  up-to-date.  Prior hepatitis C screening negative.  Due for repeat colonoscopy 2024.  Due for tetanus booster next year.  Covid vaccines complete.  11. Labs based on risk factors-lipid, hepatic, basic metabolic panel, CBC, TSH, B12.  He has had some intermittent mild paresthesias of the feet and will check B12 of that.  He has some general fatigue issues will check TSH and CBC The natural history of prostate cancer and ongoing controversy regarding screening and potential treatment outcomes of prostate cancer has been discussed with the patient. The meaning of a false positive PSA and a false negative PSA has been discussed. He indicates understanding of the limitations of this screening test and wishes  to proceed with screening PSA testing.  12. Care Plan-as below  13. Other Providers-none regularly  14. Written schedule of screening/prevention services given to patient. Health Maintenance  Topic Date Due  . TETANUS/TDAP  04/16/2022  . COLONOSCOPY (Pts 45-5yrs Insurance coverage will need to be confirmed)  08/01/2023  . INFLUENZA VACCINE  Completed  . COVID-19 Vaccine  Completed  . Hepatitis C Screening  Completed  . PNA vac Low Risk Adult  Completed      Past Medical History:  Diagnosis Date  . Arthritis   . Hyperlipidemia   . Hypertension     Past Surgical History:  Procedure Laterality Date  . ORIF Rt femur Right 01/05/2014  . TONSILLECTOMY    . TOOTH EXTRACTION     3 teeth  . TOTAL KNEE ARTHROPLASTY    . VARICOSE  VEIN SURGERY      Family History  Problem Relation Age of Onset  . Cancer Mother        colon  . Arthritis Father   . Hypertension Father   . Heart disease Father 38       MI    Social History   Socioeconomic History  . Marital status: Married    Spouse name: Not on file  . Number of children: Not on file  . Years of education: Not on file  . Highest education level: Not on file  Occupational History  . Not on file  Tobacco Use  . Smoking  status: Former Research scientist (life sciences)  . Smokeless tobacco: Never Used  . Tobacco comment: Did not review today due to time limits   Vaping Use  . Vaping Use: Never used  Substance and Sexual Activity  . Alcohol use: Yes    Comment: ooc  . Drug use: Not on file  . Sexual activity: Not on file  Other Topics Concern  . Not on file  Social History Narrative  . Not on file   Social Determinants of Health   Financial Resource Strain: Not on file  Food Insecurity: Not on file  Transportation Needs: Not on file  Physical Activity: Not on file  Stress: Not on file  Social Connections: Not on file  Intimate Partner Violence: Not on file    Outpatient Medications Prior to Visit  Medication Sig Dispense Refill  . Ascorbic Acid (VITAMIN C PO) Take by mouth.    Marland Kitchen aspirin 81 MG chewable tablet Chew 81 mg by mouth.    . Calcium Citrate-Vitamin D (CALCIUM + D PO) Take by mouth.    . cyanocobalamin 1000 MCG tablet Take 1,000 mcg by mouth daily.    . methocarbamol (ROBAXIN) 500 MG tablet Take 1 tablet (500 mg total) by mouth every 8 (eight) hours as needed for muscle spasms. 30 tablet 0  . Misc Natural Products (TART CHERRY ADVANCED PO) Take 1 capsule by mouth daily.    Marland Kitchen omeprazole (PRILOSEC OTC) 20 MG tablet Take by mouth.    . Potassium 99 MG TABS Take 1 tablet by mouth daily.    . sildenafil (VIAGRA) 100 MG tablet Take 1 tablet (100 mg total) by mouth daily as needed for erectile dysfunction. 6 tablet 11  . vitamin C (ASCORBIC ACID) 500 MG tablet Take 500 mg by mouth daily.    . Zinc Acetate, Oral, (ZINC ACETATE PO) Take by mouth.    . ezetimibe-simvastatin (VYTORIN) 10-20 MG tablet TAKE 1 TABLET AT BEDTIME 90 tablet 3  . hydrochlorothiazide (MICROZIDE) 12.5 MG capsule TAKE 1 CAPSULE DAILY 90 capsule 3  . lisinopril (ZESTRIL) 10 MG tablet TAKE 1 TABLET DAILY 90 tablet 2  . amoxicillin-clavulanate (AUGMENTIN) 875-125 MG tablet Take 1 tablet by mouth 2 (two) times daily. 20 tablet 0   No  facility-administered medications prior to visit.    No Known Allergies  ROS Review of Systems  Constitutional: Positive for fatigue. Negative for activity change, appetite change, fever and unexpected weight change.  HENT: Negative for congestion, ear pain and trouble swallowing.   Eyes: Negative for pain and visual disturbance.  Respiratory: Negative for cough, shortness of breath and wheezing.   Cardiovascular: Negative for chest pain and palpitations.  Gastrointestinal: Negative for abdominal distention, abdominal pain, blood in stool, constipation, diarrhea, nausea, rectal pain and vomiting.  Endocrine: Negative for polydipsia and polyuria.  Genitourinary: Negative for dysuria, hematuria and testicular  pain.  Musculoskeletal: Positive for arthralgias. Negative for joint swelling.  Skin: Negative for rash.  Neurological: Negative for dizziness, syncope and headaches.  Hematological: Negative for adenopathy.  Psychiatric/Behavioral: Negative for confusion and dysphoric mood.      Objective:    Physical Exam Constitutional:      General: He is not in acute distress.    Appearance: He is well-developed and well-nourished.  HENT:     Head: Normocephalic and atraumatic.     Right Ear: External ear normal.     Left Ear: External ear normal.     Mouth/Throat:     Mouth: Oropharynx is clear and moist.  Eyes:     Extraocular Movements: EOM normal.     Conjunctiva/sclera: Conjunctivae normal.     Pupils: Pupils are equal, round, and reactive to light.  Neck:     Thyroid: No thyromegaly.  Cardiovascular:     Rate and Rhythm: Normal rate and regular rhythm.     Heart sounds: Normal heart sounds. No murmur heard.   Pulmonary:     Effort: No respiratory distress.     Breath sounds: No wheezing or rales.  Abdominal:     General: Bowel sounds are normal. There is no distension.     Palpations: Abdomen is soft. There is no mass.     Tenderness: There is no abdominal tenderness.  There is no guarding or rebound.  Musculoskeletal:        General: No edema.     Cervical back: Normal range of motion and neck supple.     Right lower leg: No edema.     Left lower leg: No edema.  Lymphadenopathy:     Cervical: No cervical adenopathy.  Skin:    Findings: No rash.  Neurological:     Mental Status: He is alert and oriented to person, place, and time.     Cranial Nerves: No cranial nerve deficit.  Psychiatric:        Mood and Affect: Mood and affect and mood normal.     Comments: PHQ 2 equals 0     BP 110/64   Pulse 75   Ht 6\' 2"  (1.88 m)   Wt 242 lb (109.8 kg)   SpO2 96%   BMI 31.07 kg/m  Wt Readings from Last 3 Encounters:  11/30/20 242 lb (109.8 kg)  09/11/20 244 lb 1.6 oz (110.7 kg)  09/07/20 248 lb 9.6 oz (112.8 kg)     There are no preventive care reminders to display for this patient.  There are no preventive care reminders to display for this patient.  Lab Results  Component Value Date   TSH 2.20 11/26/2019   Lab Results  Component Value Date   WBC 22.3 (H) 09/25/2018   HGB 16.8 09/25/2018   HCT 50.4 09/25/2018   MCV 95.1 09/25/2018   PLT 214 09/25/2018   Lab Results  Component Value Date   NA 138 11/26/2019   K 4.2 11/26/2019   CO2 31 11/26/2019   GLUCOSE 125 (H) 11/26/2019   BUN 13 11/26/2019   CREATININE 1.01 11/26/2019   BILITOT 1.5 (H) 11/26/2019   ALKPHOS 39 11/26/2019   AST 18 11/26/2019   ALT 14 11/26/2019   PROT 6.8 11/26/2019   ALBUMIN 4.2 11/26/2019   CALCIUM 9.5 11/26/2019   ANIONGAP 12 09/25/2018   GFR 72.09 11/26/2019   Lab Results  Component Value Date   CHOL 159 11/26/2019   Lab Results  Component Value Date  HDL 58.40 11/26/2019   Lab Results  Component Value Date   LDLCALC 74 11/26/2019   Lab Results  Component Value Date   TRIG 136.0 11/26/2019   Lab Results  Component Value Date   CHOLHDL 3 11/26/2019   Lab Results  Component Value Date   HGBA1C 5.7 (A) 02/24/2020      Assessment &  Plan:   #1 Medicare subsequent annual wellness visit -Discussed health maintenance issues.  He is generally up-to-date.  Continue with annual flu vaccine.  Covid vaccines up-to-date  #2 hypertension stable and at goal -Continue current blood pressure medications.  Send in refills for his blood pressure medications  #3 dyslipidemia.  Patient on Vytorin. -Recommend goal LDL less than 70 -Recheck lipid and hepatic panel  #4 thoracic aortic aneurysm -Repeat imaging with MRA around May or June of this year  #5 history of prediabetes range blood sugars -Include A1c with labs  #6 fatigue.  No exercise intolerance.  Check CBC and TSH along with B12 level  Meds ordered this encounter  Medications  . lisinopril (ZESTRIL) 10 MG tablet    Sig: TAKE 1 TABLET DAILY    Dispense:  90 tablet    Refill:  3  . hydrochlorothiazide (MICROZIDE) 12.5 MG capsule    Sig: Take 1 capsule (12.5 mg total) by mouth daily.    Dispense:  90 capsule    Refill:  3  . ezetimibe-simvastatin (VYTORIN) 10-20 MG tablet    Sig: Take 1 tablet by mouth at bedtime.    Dispense:  90 tablet    Refill:  3    Follow-up: No follow-ups on file.    Carolann Littler, MD

## 2020-12-01 ENCOUNTER — Telehealth: Payer: Self-pay

## 2020-12-01 NOTE — Telephone Encounter (Signed)
-----   Message from Eulas Post, MD sent at 11/30/2020  9:01 PM EST ----- Lipids are excellent,  PSA stable.   A1C has climbed slightly from 5.7% last year to 6.3%.  B12 and thyroid normal.  CBC normal.  No clear explanation for fatigue.   Keep glucose and starch intake down.  Consider 6 month follow up to reassess A1C.

## 2020-12-01 NOTE — Telephone Encounter (Signed)
Patient informed of results and recommendations.  Appointment scheduled.

## 2020-12-21 ENCOUNTER — Telehealth: Payer: Self-pay

## 2020-12-21 DIAGNOSIS — I7121 Aneurysm of the ascending aorta, without rupture: Secondary | ICD-10-CM

## 2020-12-21 DIAGNOSIS — I712 Thoracic aortic aneurysm, without rupture, unspecified: Secondary | ICD-10-CM

## 2020-12-21 NOTE — Telephone Encounter (Signed)
[facility] called regarding MR A order.  They would like to know if it can be switched to with and without and does patient need to wait until May or June?

## 2020-12-21 NOTE — Telephone Encounter (Signed)
New order placed

## 2020-12-21 NOTE — Telephone Encounter (Signed)
May be switched to with and without contrast.  Only reason to wait for may for imaging would be if there is an insurance problem with doing sooner.  His last scan was May 2021 and that is why we had suggested around May of this year.

## 2020-12-28 ENCOUNTER — Other Ambulatory Visit: Payer: Medicare Other

## 2021-02-25 NOTE — Telephone Encounter (Signed)
Close encounter 

## 2021-03-11 NOTE — Progress Notes (Signed)
CARDIOLOGY CONSULT NOTE     Virtual Visit via Video Note   This visit type was conducted due to national recommendations for restrictions regarding the COVID-19 Pandemic (e.g. social distancing) in an effort to limit this patient's exposure and mitigate transmission in our community.  Due to her co-morbid illnesses, this patient is at least at moderate risk for complications without adequate follow up.  This format is felt to be most appropriate for this patient at this time.  All issues noted in this document were discussed and addressed.  A limited physical exam was performed with this format.  Please refer to the patient's chart for her consent to telehealth for [facility].   Patient location: Home Physician location: Office   Patient ID: Paul Townsend MRN: [id number] DOB/AGE: 02-24-1946 75 y.o.  Admit date: (Not on file) Referring Physician: Burchette Primary Physician: Eulas Post, MD Primary Cardiologist: Johnsie Cancel Reason for Consultation: CAD/Aortic Aneurysm  HPI:  75 y.o. last seen January 08 2019 CRF;s HTN, HLD and pre diabetes. Had pancreatitis November 2019 and CT noted 4.3 cm aortic aneurysm and coronary calcification  F/U myovue 03/19/19 normal with no ischemia EF 58% Was active with no cardiac symptoms On statin with good BP control F/U CTA 03/10/20 stable 4.3 cm ascending thoracic aortic aneurysm  April 2021 had palpitations and primary ordered telemetry monitor showed NSR nocturnal bradycardia first degree AV block no high grade block or significant sustained arrhythmias   Labs normal 11/30/20 A1c 6.3 K 4.1 Cr 0.9 Hct 50.4 TSH 1.75  In [employer] and was in [place], [place], [place] the [place]   Walking with no symptoms 3 miles/day Sees chiropractor A lot for his back    ROS All other systems reviewed and negative except as noted above  Past Medical History:  Diagnosis Date  . Arthritis   . Hyperlipidemia   . Hypertension     Family History  Problem Relation Age  of Onset  . Cancer Mother        colon  . Arthritis Father   . Hypertension Father   . Heart disease Father 24       MI    Social History   Socioeconomic History  . Marital status: Married    Spouse name: Not on file  . Number of children: Not on file  . Years of education: Not on file  . Highest education level: Not on file  Occupational History  . Not on file  Tobacco Use  . Smoking status: Former Research scientist (life sciences)  . Smokeless tobacco: Never Used  . Tobacco comment: Did not review today due to time limits   Vaping Use  . Vaping Use: Never used  Substance and Sexual Activity  . Alcohol use: Yes    Comment: ooc  . Drug use: Not on file  . Sexual activity: Not on file  Other Topics Concern  . Not on file  Social History Narrative  . Not on file   Social Determinants of Health   Financial Resource Strain: Not on file  Food Insecurity: Not on file  Transportation Needs: Not on file  Physical Activity: Not on file  Stress: Not on file  Social Connections: Not on file  Intimate Partner Violence: Not on file    Past Surgical History:  Procedure Laterality Date  . ORIF Rt femur Right 01/05/2014  . TONSILLECTOMY    . TOOTH EXTRACTION     3 teeth  . TOTAL KNEE ARTHROPLASTY    . VARICOSE  VEIN SURGERY        Current Outpatient Medications:  .  Ascorbic Acid (VITAMIN C PO), Take by mouth., Disp: , Rfl:  .  aspirin 81 MG chewable tablet, Chew 81 mg by mouth., Disp: , Rfl:  .  Calcium Citrate-Vitamin D (CALCIUM + D PO), Take by mouth., Disp: , Rfl:  .  ezetimibe-simvastatin (VYTORIN) 10-20 MG tablet, Take 1 tablet by mouth at bedtime., Disp: 90 tablet, Rfl: 3 .  hydrochlorothiazide (MICROZIDE) 12.5 MG capsule, Take 1 capsule (12.5 mg total) by mouth daily., Disp: 90 capsule, Rfl: 3 .  lisinopril (ZESTRIL) 10 MG tablet, TAKE 1 TABLET DAILY, Disp: 90 tablet, Rfl: 3 .  Misc Natural Products (TART CHERRY ADVANCED PO), Take 1 capsule by mouth daily., Disp: , Rfl:  .  omeprazole  (PRILOSEC OTC) 20 MG tablet, Take by mouth., Disp: , Rfl:  .  Potassium 99 MG TABS, Take 1 tablet by mouth daily., Disp: , Rfl:  .  sildenafil (VIAGRA) 100 MG tablet, Take 1 tablet (100 mg total) by mouth daily as needed for erectile dysfunction., Disp: 6 tablet, Rfl: 11 .  Zinc Acetate, Oral, (ZINC ACETATE PO), Take by mouth., Disp: , Rfl:     Physical Exam: Blood pressure 140/75, pulse 73, height 6\' 2"  (1.88 m), weight 108.4 kg.    Phone no exam    Labs:   Lab Results  Component Value Date   WBC 7.7 11/30/2020   HGB 16.0 11/30/2020   HCT 46.8 11/30/2020   MCV 94.0 11/30/2020   PLT 205.0 11/30/2020   No results for input(s): NA, K, CL, CO2, BUN, CREATININE, CALCIUM, PROT, BILITOT, ALKPHOS, ALT, AST, GLUCOSE in the last 168 hours.  Invalid input(s): LABALBU No results found for: CKTOTAL, CKMB, CKMBINDEX, TROPONINI  Lab Results  Component Value Date   CHOL 145 11/30/2020   CHOL 159 11/26/2019   CHOL 124 10/19/2018   Lab Results  Component Value Date   HDL 56.60 11/30/2020   HDL 58.40 11/26/2019   HDL 52.70 10/19/2018   Lab Results  Component Value Date   LDLCALC 65 11/30/2020   LDLCALC 74 11/26/2019   LDLCALC 55 10/19/2018   Lab Results  Component Value Date   TRIG 118.0 11/30/2020   TRIG 136.0 11/26/2019   TRIG 83.0 10/19/2018   Lab Results  Component Value Date   CHOLHDL 3 11/30/2020   CHOLHDL 3 11/26/2019   CHOLHDL 2 10/19/2018   Lab Results  Component Value Date   LDLDIRECT 114.7 08/19/2013      Radiology: No results found.  EKG: SR rate 84 Q wave in lead 2 PVC 01/08/19    ASSESSMENT AND PLAN:   1. Thoracic Aneurysm:  Stable 4.3 cm by CT May 2021 see below 2. HTN:  Well controlled.  Continue current medications and low sodium Dash type diet.   3. HLD:  On vytorin LDL 74 11/30/20   4. GERD:  Low carb diet PRN Prilosec  5. CAD:  Calcium noted on CT done for pancreatitis normal myovue 03/19/19 with need to evaluate aneurysm will order gated cardiac  CTA with scanning from clavicles down BMET prior to and 100 mg lopressor 2 hours before No contrast allergy   Time:  Spent reviewing notes March 2020 recent labs primary care note CTA 03/10/20 myovue 03/19/19 and Telemetry monitor 01/21/20 direct patient interview and composing note 30 minutes   Signed: Jenkins Rouge 03/12/2021, 8:36 AM

## 2021-03-12 ENCOUNTER — Telehealth (INDEPENDENT_AMBULATORY_CARE_PROVIDER_SITE_OTHER): Payer: Medicare Other | Admitting: Cardiovascular Disease

## 2021-03-12 ENCOUNTER — Encounter: Payer: Self-pay | Admitting: Cardiovascular Disease

## 2021-03-12 ENCOUNTER — Other Ambulatory Visit: Payer: Self-pay | Admitting: Cardiovascular Disease

## 2021-03-12 ENCOUNTER — Other Ambulatory Visit: Payer: Self-pay

## 2021-03-12 ENCOUNTER — Other Ambulatory Visit (HOSPITAL_COMMUNITY): Payer: Self-pay | Admitting: Cardiovascular Disease

## 2021-03-12 VITALS — BP 140/75 | HR 73 | Ht 74.0 in | Wt 239.0 lb

## 2021-03-12 DIAGNOSIS — I712 Thoracic aortic aneurysm, without rupture, unspecified: Secondary | ICD-10-CM

## 2021-03-12 DIAGNOSIS — I2510 Atherosclerotic heart disease of native coronary artery without angina pectoris: Secondary | ICD-10-CM

## 2021-03-12 DIAGNOSIS — I10 Essential (primary) hypertension: Secondary | ICD-10-CM | POA: Diagnosis not present

## 2021-03-12 DIAGNOSIS — E782 Mixed hyperlipidemia: Secondary | ICD-10-CM | POA: Diagnosis not present

## 2021-03-12 DIAGNOSIS — Z01812 Encounter for preprocedural laboratory examination: Secondary | ICD-10-CM

## 2021-03-12 DIAGNOSIS — Z01818 Encounter for other preprocedural examination: Secondary | ICD-10-CM

## 2021-03-12 MED ORDER — METOPROLOL TARTRATE 100 MG PO TABS: 100.0000 mg | ORAL_TABLET | ORAL | 1 refills | Status: DC

## 2021-03-12 NOTE — Patient Instructions (Addendum)
Medication Instructions:  NO CHANGES *If you need a refill on your cardiac medications before your next appointment, please call your pharmacy*   Lab Work: BMET PRIOR TO CTA If you have labs (blood work) drawn today and your tests are completely normal, you will receive your results only by: Marland Kitchen MyChart Message (if you have MyChart) OR . A paper copy in the mail If you have any lab test that is abnormal or we need to change your treatment, we will call you to review the results.   Testing/Procedures: Your cardiac CT will be scheduled at one of the below locations:   [facility] [address] [place], [place] [address] [phone]   If scheduled at [facility], please arrive at the [facility] main entrance (entrance A) of [facility] 30 minutes prior to test start time. Proceed to the [facility] Radiology Department (first floor) to check-in and test prep.  Please follow these instructions carefully (unless otherwise directed):  Hold all erectile dysfunction medications at least 3 days (72 hrs) prior to test.  On the Night Before the Test: . Be sure to Drink plenty of water. . Do not consume any caffeinated/decaffeinated beverages or chocolate 12 hours prior to your test. . Do not take any antihistamines 12 hours prior to your test. IOn the Day of the Test: . Drink plenty of water until 1 hour prior to the test. . Do not eat any food 4 hours prior to the test. . You may take your regular medications prior to the test.  . Take metoprolol (Lopressor) two hours prior to test. . HOLD Furosemide/Hydrochlorothiazide morning of the test. After the Test: . Drink plenty of water. . After receiving IV contrast, you may experience a mild flushed feeling. This is normal. . On occasion, you may experience a mild rash up to 24 hours after the test. This is not dangerous. If this occurs, you can take Benadryl 25 mg and increase your fluid intake. . If you  experience trouble breathing, this can be serious. If it is severe call 911 IMMEDIATELY. If it is mild, please call our office. . If you take any of these medications: Glipizide/Metformin, Avandament, Glucavance, please do not take 48 hours after completing test unless otherwise instructed.   Once we have confirmed authorization from your insurance company, we will call you to set up a date and time for your test. Based on how quickly your insurance processes prior authorizations requests, please allow up to 4 weeks to be contacted for scheduling your Cardiac CT appointment. Be advised that routine Cardiac CT appointments could be scheduled as many as 8 weeks after your provider has ordered it.  For non-scheduling related questions, please contact the cardiac imaging nurse navigator should you have any questions/concerns: Marchia Bond, Cardiac Imaging Nurse Navigator Gordy Clement, Cardiac Imaging Nurse Navigator [facility] Direct Office Dial: [phone]   For scheduling needs, including cancellations and rescheduling, please call [place], [phone].    Follow-Up: At [facility], you and your health needs are our priority.  As part of our continuing mission to provide you with exceptional heart care, we have created designated Provider Care Teams.  These Care Teams include your primary Cardiologist (physician) and Advanced Practice Providers (APPs -  Physician Assistants and Nurse Practitioners) who all work together to provide you with the care you need, when you need it.  We recommend signing up for the patient portal called "MyChart".  Sign up information is provided on this After Visit Summary.  MyChart is used to connect with patients for Virtual Visits (Telemedicine).  Patients are able to view lab/test results, encounter notes, upcoming appointments, etc.  Non-urgent messages can be sent to your provider as well.   To learn more about what you can do  with MyChart, go to [email or web address].    Your next appointment:   1 year(s)  The format for your next appointment:   In Person  Provider:   Jenkins Rouge, MD   Other Instructions NONE

## 2021-03-26 DIAGNOSIS — Z20822 Contact with and (suspected) exposure to covid-19: Secondary | ICD-10-CM | POA: Diagnosis not present

## 2021-03-30 ENCOUNTER — Other Ambulatory Visit: Payer: Medicare Other

## 2021-03-30 ENCOUNTER — Ambulatory Visit
Admission: RE | Admit: 2021-03-30 | Discharge: 2021-03-30 | Disposition: A | Discharge status: Home / Self Care | Payer: Medicare Other | Source: Ambulatory Visit | Attending: Family Medicine | Admitting: Family Medicine

## 2021-03-30 DIAGNOSIS — I712 Thoracic aortic aneurysm, without rupture, unspecified: Secondary | ICD-10-CM

## 2021-03-30 DIAGNOSIS — I7121 Aneurysm of the ascending aorta, without rupture: Secondary | ICD-10-CM

## 2021-03-30 MED ORDER — GADOBENATE DIMEGLUMINE 529 MG/ML IV SOLN
19.0000 mL | Freq: Once | INTRAVENOUS | Status: AC | PRN
  Administered 2021-03-30: 19 mL via INTRAVENOUS

## 2021-04-07 ENCOUNTER — Other Ambulatory Visit (HOSPITAL_COMMUNITY): Payer: Self-pay | Admitting: Emergency Medicine

## 2021-04-07 DIAGNOSIS — I712 Thoracic aortic aneurysm, without rupture, unspecified: Secondary | ICD-10-CM

## 2021-04-13 ENCOUNTER — Telehealth (HOSPITAL_COMMUNITY): Payer: Self-pay | Admitting: Emergency Medicine

## 2021-04-13 DIAGNOSIS — Z471 Aftercare following joint replacement surgery: Secondary | ICD-10-CM | POA: Diagnosis not present

## 2021-04-13 DIAGNOSIS — Z9889 Other specified postprocedural states: Secondary | ICD-10-CM | POA: Diagnosis not present

## 2021-04-13 DIAGNOSIS — Z96653 Presence of artificial knee joint, bilateral: Secondary | ICD-10-CM | POA: Diagnosis not present

## 2021-04-13 DIAGNOSIS — I2510 Atherosclerotic heart disease of native coronary artery without angina pectoris: Secondary | ICD-10-CM

## 2021-04-13 DIAGNOSIS — Z8781 Personal history of (healed) traumatic fracture: Secondary | ICD-10-CM | POA: Diagnosis not present

## 2021-04-13 MED ORDER — METOPROLOL TARTRATE 50 MG PO TABS: 50.0000 mg | ORAL_TABLET | Freq: Once | ORAL | 0 refills | Status: DC

## 2021-04-13 NOTE — Telephone Encounter (Signed)
Reaching out to patient to offer assistance regarding upcoming cardiac imaging study; pt verbalizes understanding of appt date/time, parking situation and where to check in, pre-test NPO status and medications ordered, and verified current allergies; name and call back number provided for further questions should they arise Marchia Bond RN Navigator Cardiac Imaging [facility] [phone] office [phone] cell  Hold HCTZ, viagra 50mg  metoprolol tartrate   Pt took 100mg  metoprolol tartrate for MRA and was very dizzy.   Clarise Cruz

## 2021-04-14 ENCOUNTER — Other Ambulatory Visit: Payer: Self-pay

## 2021-04-14 ENCOUNTER — Ambulatory Visit (HOSPITAL_COMMUNITY)
Admission: RE | Admit: 2021-04-14 | Discharge: 2021-04-14 | Disposition: A | Discharge status: Home / Self Care | Payer: Medicare Other | Source: Ambulatory Visit | Attending: Cardiovascular Disease | Admitting: Cardiovascular Disease

## 2021-04-14 DIAGNOSIS — I2510 Atherosclerotic heart disease of native coronary artery without angina pectoris: Secondary | ICD-10-CM | POA: Diagnosis not present

## 2021-04-14 DIAGNOSIS — I712 Thoracic aortic aneurysm, without rupture, unspecified: Secondary | ICD-10-CM

## 2021-04-14 LAB — POCT I-STAT CREATININE: Creatinine, Ser: 0.9 mg/dL (ref 0.61–1.24)

## 2021-04-14 MED ORDER — NITROGLYCERIN 0.4 MG SL SUBL
0.8000 mg | SUBLINGUAL_TABLET | Freq: Once | SUBLINGUAL | Status: AC
  Administered 2021-04-14: 0.8 mg via SUBLINGUAL

## 2021-04-14 MED ORDER — IOHEXOL 350 MG/ML SOLN
100.0000 mL | Freq: Once | INTRAVENOUS | Status: AC | PRN
  Administered 2021-04-14: 100 mL via INTRAVENOUS

## 2021-04-14 MED ORDER — NITROGLYCERIN 0.4 MG SL SUBL
SUBLINGUAL_TABLET | SUBLINGUAL | Status: AC
  Filled 2021-04-14: qty 2

## 2021-04-15 ENCOUNTER — Other Ambulatory Visit (HOSPITAL_COMMUNITY): Payer: Self-pay | Admitting: Cardiology

## 2021-04-15 ENCOUNTER — Other Ambulatory Visit (HOSPITAL_COMMUNITY): Payer: Self-pay | Admitting: Emergency Medicine

## 2021-04-15 ENCOUNTER — Ambulatory Visit (HOSPITAL_COMMUNITY)
Admission: RE | Admit: 2021-04-15 | Discharge: 2021-04-15 | Disposition: A | Discharge status: Home / Self Care | Payer: Medicare Other | Source: Ambulatory Visit | Attending: Cardiology | Admitting: Cardiology

## 2021-04-15 DIAGNOSIS — R931 Abnormal findings on diagnostic imaging of heart and coronary circulation: Secondary | ICD-10-CM

## 2021-05-04 ENCOUNTER — Ambulatory Visit (INDEPENDENT_AMBULATORY_CARE_PROVIDER_SITE_OTHER): Payer: Medicare Other | Admitting: Family Medicine

## 2021-05-04 ENCOUNTER — Other Ambulatory Visit: Payer: Self-pay

## 2021-05-04 VITALS — BP 130/62 | HR 71 | Temp 98.3°F | Wt 241.4 lb

## 2021-05-04 DIAGNOSIS — E785 Hyperlipidemia, unspecified: Secondary | ICD-10-CM | POA: Diagnosis not present

## 2021-05-04 DIAGNOSIS — I2510 Atherosclerotic heart disease of native coronary artery without angina pectoris: Secondary | ICD-10-CM

## 2021-05-04 DIAGNOSIS — R7303 Prediabetes: Secondary | ICD-10-CM | POA: Diagnosis not present

## 2021-05-04 DIAGNOSIS — I10 Essential (primary) hypertension: Secondary | ICD-10-CM | POA: Diagnosis not present

## 2021-05-04 DIAGNOSIS — I712 Thoracic aortic aneurysm, without rupture: Secondary | ICD-10-CM | POA: Diagnosis not present

## 2021-05-04 DIAGNOSIS — I7121 Aneurysm of the ascending aorta, without rupture: Secondary | ICD-10-CM

## 2021-05-04 LAB — POCT GLYCOSYLATED HEMOGLOBIN (HGB A1C): Hemoglobin A1C: 5.8 % — AB (ref 4.0–5.6)

## 2021-05-04 NOTE — Progress Notes (Signed)
Established Patient Office Visit  Subjective:  Patient ID: Paul Townsend, male    DOB: July 19, 1946  Age: 75 y.o. MRN: [id number]  CC:  Chief Complaint  Patient presents with   Follow-up    A1C    HPI Paul Townsend presents for medical follow-up.  He has history of hyperglycemia and most recent A1c was 6.3%.  He has made some dietary changes.  He is all but eliminated wine and has scaled back carbs and is walking regularly.  No polyuria or polydipsia.  He had recent CT morphology study which showed nonobstructive disease in the left circumflex and right coronary artery.  He had extensive calcified plaque in the LAD system with no hemodynamically significant disease.Paul Townsend  He remains on Vytorin and is taking this regularly.  He had lipids done back in January with LDL cholesterol of 65.  His blood pressures have been stable.  He remains on lisinopril and HCTZ.  Walking regularly with no recent chest pains.  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  Past Medical History:  Diagnosis Date   Arthritis    Hyperlipidemia    Hypertension     Past Surgical History:  Procedure Laterality Date   ORIF Rt femur Right 01/05/2014   TONSILLECTOMY     TOOTH EXTRACTION     3 teeth   TOTAL KNEE ARTHROPLASTY     VARICOSE VEIN SURGERY      Family History  Problem Relation Age of Onset   Cancer Mother        colon   Arthritis Father    Hypertension Father    Heart disease Father 63       MI    Social History   Socioeconomic History   Marital status: Married    Spouse name: Not on file   Number of children: Not on file   Years of education: Not on file   Highest education level: Not on file  Occupational History   Not on file  Tobacco Use   Smoking status: Former    Pack years: 0.00   Smokeless tobacco: Never   Tobacco comments:    Did not review today due to time limits   Vaping Use   Vaping Use: Never used  Substance and  Sexual Activity   Alcohol use: Yes    Comment: ooc   Drug use: Not on file   Sexual activity: Not on file  Other Topics Concern   Not on file  Social History Narrative   Not on file   Social Determinants of Health   Financial Resource Strain: Not on file  Food Insecurity: Not on file  Transportation Needs: Not on file  Physical Activity: Not on file  Stress: Not on file  Social Connections: Not on file  Intimate Partner Violence: Not on file    Outpatient Medications Prior to Visit  Medication Sig Dispense Refill   Ascorbic Acid (VITAMIN C PO) Take by mouth.     aspirin 81 MG chewable tablet Chew 81 mg by mouth.     Calcium Citrate-Vitamin D (CALCIUM + D PO) Take by mouth.     ezetimibe-simvastatin (VYTORIN) 10-20 MG tablet Take 1 tablet by mouth at bedtime. 90 tablet 3   hydrochlorothiazide (MICROZIDE) 12.5 MG capsule Take 1 capsule (12.5 mg total) by mouth daily. 90 capsule 3   lisinopril (ZESTRIL) 10 MG tablet TAKE 1 TABLET DAILY 90 tablet 3  Misc Natural Products (TART CHERRY ADVANCED PO) Take 1 capsule by mouth daily.     omeprazole (PRILOSEC OTC) 20 MG tablet Take by mouth.     Potassium 99 MG TABS Take 1 tablet by mouth daily.     sildenafil (VIAGRA) 100 MG tablet Take 1 tablet (100 mg total) by mouth daily as needed for erectile dysfunction. 6 tablet 11   Zinc Acetate, Oral, (ZINC ACETATE PO) Take by mouth.     metoprolol tartrate (LOPRESSOR) 100 MG tablet Take 1 tablet (100 mg total) by mouth as directed. 1 TAB 2 HOURS BEFORE CT 1 tablet 1   metoprolol tartrate (LOPRESSOR) 50 MG tablet Take 1 tablet (50 mg total) by mouth once for 1 dose. Take 2 hr prior to CT scan 1 tablet 0   No facility-administered medications prior to visit.    No Known Allergies  ROS Review of Systems  Constitutional:  Negative for fatigue.  Eyes:  Negative for visual disturbance.  Respiratory:  Negative for cough, chest tightness and shortness of breath.   Cardiovascular:  Negative for  chest pain, palpitations and leg swelling.  Endocrine: Negative for polydipsia and polyuria.  Neurological:  Negative for dizziness, syncope, weakness, light-headedness and headaches.     Objective:    Physical Exam Constitutional:      Appearance: He is well-developed.  HENT:     Right Ear: External ear normal.     Left Ear: External ear normal.  Eyes:     Pupils: Pupils are equal, round, and reactive to light.  Neck:     Thyroid: No thyromegaly.  Cardiovascular:     Rate and Rhythm: Normal rate and regular rhythm.  Pulmonary:     Effort: Pulmonary effort is normal. No respiratory distress.     Breath sounds: Normal breath sounds. No wheezing or rales.  Musculoskeletal:     Cervical back: Neck supple.     Right lower leg: No edema.     Left lower leg: No edema.  Neurological:     Mental Status: He is alert and oriented to person, place, and time.    BP 130/62 (BP Location: Left Arm, Patient Position: Sitting, Cuff Size: Normal)   Pulse 71   Temp 98.3 F (36.8 C) (Oral)   Wt 241 lb 6.4 oz (109.5 kg)   SpO2 95%   BMI 30.99 kg/m  Wt Readings from Last 3 Encounters:  05/04/21 241 lb 6.4 oz (109.5 kg)  03/12/21 239 lb (108.4 kg)  11/30/20 242 lb (109.8 kg)     Health Maintenance Due  Topic Date Due   Zoster Vaccines- Shingrix (2 of 2) 04/25/2017    There are no preventive care reminders to display for this patient.  Lab Results  Component Value Date   TSH 1.75 11/30/2020   Lab Results  Component Value Date   WBC 7.7 11/30/2020   HGB 16.0 11/30/2020   HCT 46.8 11/30/2020   MCV 94.0 11/30/2020   PLT 205.0 11/30/2020   Lab Results  Component Value Date   NA 136 11/30/2020   K 4.1 11/30/2020   CO2 27 11/30/2020   GLUCOSE 110 (H) 11/30/2020   BUN 15 11/30/2020   CREATININE 0.90 04/14/2021   BILITOT 1.9 (H) 11/30/2020   ALKPHOS 45 11/30/2020   AST 17 11/30/2020   ALT 14 11/30/2020   PROT 7.0 11/30/2020   ALBUMIN 4.5 11/30/2020   CALCIUM 9.6  11/30/2020   ANIONGAP 12 09/25/2018   GFR 82.39 11/30/2020  Lab Results  Component Value Date   CHOL 145 11/30/2020   Lab Results  Component Value Date   HDL 56.60 11/30/2020   Lab Results  Component Value Date   LDLCALC 65 11/30/2020   Lab Results  Component Value Date   TRIG 118.0 11/30/2020   Lab Results  Component Value Date   CHOLHDL 3 11/30/2020   Lab Results  Component Value Date   HGBA1C 5.8 (A) 05/04/2021      Assessment & Plan:   #1 hyperglycemia.  A1c is improved from 6.3% to 5.8%. -Continue positive dietary changes and regular exercise habits.  Consider repeat A1c in 6 months  #2 hyperlipidemia.  Goal LDL less than 70.  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 -Continue close follow-up with cardiology  #4 hypertension stable and at goal -Continue current doses of HCTZ and lisinopril  #5 ascending aortic aneurysm stable by recent MRA.  Will need yearly screening with CTA or MRA   No orders of the defined types were placed in this encounter.   Follow-up: Return in about 6 months (around 11/03/2021).    Carolann Littler, MD

## 2021-05-04 NOTE — Patient Instructions (Signed)
A1C improved to 5.8%  Keep up the good work!     Let's plan on 6 month follow up.

## 2021-06-07 ENCOUNTER — Other Ambulatory Visit: Payer: Self-pay | Admitting: Family Medicine

## 2021-06-09 ENCOUNTER — Other Ambulatory Visit: Payer: Self-pay

## 2021-06-09 MED ORDER — LISINOPRIL 10 MG PO TABS: ORAL_TABLET | ORAL | 3 refills | Status: DC

## 2021-08-18 DIAGNOSIS — Z961 Presence of intraocular lens: Secondary | ICD-10-CM | POA: Diagnosis not present

## 2021-08-18 DIAGNOSIS — H18513 Endothelial corneal dystrophy, bilateral: Secondary | ICD-10-CM | POA: Diagnosis not present

## 2021-08-18 DIAGNOSIS — H35372 Puckering of macula, left eye: Secondary | ICD-10-CM | POA: Diagnosis not present

## 2021-08-18 DIAGNOSIS — H524 Presbyopia: Secondary | ICD-10-CM | POA: Diagnosis not present

## 2021-09-23 DIAGNOSIS — Z23 Encounter for immunization: Secondary | ICD-10-CM | POA: Diagnosis not present

## 2021-09-24 ENCOUNTER — Other Ambulatory Visit: Payer: Self-pay | Admitting: Family Medicine

## 2021-09-24 MED ORDER — HYDROCHLOROTHIAZIDE 12.5 MG PO CAPS
12.5000 mg | ORAL_CAPSULE | Freq: Every day | ORAL | 3 refills | Status: DC
Start: 2021-09-24 — End: 2022-08-29

## 2021-10-08 ENCOUNTER — Ambulatory Visit (INDEPENDENT_AMBULATORY_CARE_PROVIDER_SITE_OTHER): Payer: Medicare Other | Admitting: Family Medicine

## 2021-10-08 VITALS — BP 140/70 | HR 66 | Temp 98.7°F | Ht 74.0 in | Wt 248.0 lb

## 2021-10-08 DIAGNOSIS — I2510 Atherosclerotic heart disease of native coronary artery without angina pectoris: Secondary | ICD-10-CM | POA: Diagnosis not present

## 2021-10-08 DIAGNOSIS — K644 Residual hemorrhoidal skin tags: Secondary | ICD-10-CM

## 2021-10-08 MED ORDER — TRIAMCINOLONE ACETONIDE 0.1 % EX CREA: 1.0000 "application " | TOPICAL_CREAM | Freq: Two times a day (BID) | CUTANEOUS | 1 refills | Status: DC

## 2021-10-08 NOTE — Progress Notes (Signed)
Established Patient Office Visit  Subjective:  Patient ID: Paul Townsend, male    DOB: 31-May-1946  Age: 75 y.o. MRN: [id number]  CC:  Chief Complaint  Patient presents with   Hemorrhoids    X 63mo. Has used preparation H.    HPI Paul Townsend presents for concern for probable hemorrhoid which he first noted about a month ago.  This is left side of the anus.  No significant discomfort.  No bleeding.  No recent straining with bowel movements.  He is used some topical Preparation H.  He does think this has gone down slightly in size.  No prior issues with hemorrhoids Last colonoscopy reportedly 9/14.  Past Medical History:  Diagnosis Date   Arthritis    Hyperlipidemia    Hypertension     Past Surgical History:  Procedure Laterality Date   ORIF Rt femur Right 01/05/2014   TONSILLECTOMY     TOOTH EXTRACTION     3 teeth   TOTAL KNEE ARTHROPLASTY     VARICOSE VEIN SURGERY      Family History  Problem Relation Age of Onset   Cancer Mother        colon   Arthritis Father    Hypertension Father    Heart disease Father 56       MI    Social History   Socioeconomic History   Marital status: Married    Spouse name: Not on file   Number of children: Not on file   Years of education: Not on file   Highest education level: Not on file  Occupational History   Not on file  Tobacco Use   Smoking status: Former   Smokeless tobacco: Never   Tobacco comments:    Did not review today due to time limits   Vaping Use   Vaping Use: Never used  Substance and Sexual Activity   Alcohol use: Yes    Comment: ooc   Drug use: Not on file   Sexual activity: Not on file  Other Topics Concern   Not on file  Social History Narrative   Not on file   Social Determinants of Health   Financial Resource Strain: Not on file  Food Insecurity: Not on file  Transportation Needs: Not on file  Physical Activity: Not on file  Stress: Not on file  Social Connections: Not on file   Intimate Partner Violence: Not on file    Outpatient Medications Prior to Visit  Medication Sig Dispense Refill   Ascorbic Acid (VITAMIN C PO) Take by mouth.     aspirin 81 MG chewable tablet Chew 81 mg by mouth.     Calcium Citrate-Vitamin D (CALCIUM + D PO) Take by mouth.     ezetimibe-simvastatin (VYTORIN) 10-20 MG tablet TAKE 1 TABLET AT BEDTIME 90 tablet 3   ezetimibe-simvastatin (VYTORIN) 10-20 MG tablet Take 1 tablet by mouth daily.     hydrochlorothiazide (HYDRODIURIL) 25 MG tablet Take 0.5 tablets by mouth every morning.     hydrochlorothiazide (MICROZIDE) 12.5 MG capsule Take 1 capsule (12.5 mg total) by mouth daily. 90 capsule 3   lisinopril (ZESTRIL) 10 MG tablet TAKE 1 TABLET DAILY 90 tablet 3   Misc Natural Products (TART CHERRY ADVANCED PO) Take 1 capsule by mouth daily.     omeprazole (PRILOSEC OTC) 20 MG tablet Take by mouth.     Potassium 99 MG TABS Take 1 tablet by mouth daily.     sildenafil (VIAGRA) 100  MG tablet Take 1 tablet (100 mg total) by mouth daily as needed for erectile dysfunction. 6 tablet 11   Zinc Acetate, Oral, (ZINC ACETATE PO) Take by mouth.     No facility-administered medications prior to visit.    No Known Allergies  ROS Review of Systems  Constitutional:  Negative for appetite change and unexpected weight change.  Gastrointestinal:  Negative for blood in stool and constipation.     Objective:    Physical Exam Vitals reviewed.  Constitutional:      Appearance: Normal appearance.  Cardiovascular:     Rate and Rhythm: Normal rate and regular rhythm.  Genitourinary:    Comments: He has swollen external hemorrhoid around 11 o'clock position.  No acute thrombosis.  Nontender.  No bleeding noted at this time. Neurological:     Mental Status: He is alert.    BP 140/70 (BP Location: Right Arm, Patient Position: Sitting, Cuff Size: Normal)   Pulse 66   Temp 98.7 F (37.1 C) (Oral)   Ht 6\' 2"  (1.88 m)   Wt 248 lb (112.5 kg)   SpO2 96%    BMI 31.84 kg/m  Wt Readings from Last 3 Encounters:  10/08/21 248 lb (112.5 kg)  05/04/21 241 lb 6.4 oz (109.5 kg)  03/12/21 239 lb (108.4 kg)     Health Maintenance Due  Topic Date Due   Zoster Vaccines- Shingrix (2 of 2) 04/25/2017    There are no preventive care reminders to display for this patient.  Lab Results  Component Value Date   TSH 1.75 11/30/2020   Lab Results  Component Value Date   WBC 7.7 11/30/2020   HGB 16.0 11/30/2020   HCT 46.8 11/30/2020   MCV 94.0 11/30/2020   PLT 205.0 11/30/2020   Lab Results  Component Value Date   NA 136 11/30/2020   K 4.1 11/30/2020   CO2 27 11/30/2020   GLUCOSE 110 (H) 11/30/2020   BUN 15 11/30/2020   CREATININE 0.90 04/14/2021   BILITOT 1.9 (H) 11/30/2020   ALKPHOS 45 11/30/2020   AST 17 11/30/2020   ALT 14 11/30/2020   PROT 7.0 11/30/2020   ALBUMIN 4.5 11/30/2020   CALCIUM 9.6 11/30/2020   ANIONGAP 12 09/25/2018   GFR 82.39 11/30/2020   Lab Results  Component Value Date   CHOL 145 11/30/2020   Lab Results  Component Value Date   HDL 56.60 11/30/2020   Lab Results  Component Value Date   LDLCALC 65 11/30/2020   Lab Results  Component Value Date   TRIG 118.0 11/30/2020   Lab Results  Component Value Date   CHOLHDL 3 11/30/2020   Lab Results  Component Value Date   HGBA1C 5.8 (A) 05/04/2021      Assessment & Plan:   Problem List Items Addressed This Visit   None Visit Diagnoses     External hemorrhoid    -  Primary   Relevant Medications   ezetimibe-simvastatin (VYTORIN) 10-20 MG tablet   hydrochlorothiazide (HYDRODIURIL) 25 MG tablet     Discussed measures to reduce constipation with plenty of fluids and fiber and stool softeners as necessary.  Triamcinolone 0.1% cream twice daily  Sitz bath as once daily  Be in touch if this is not reducing in size over the next month or 2  Meds ordered this encounter  Medications   triamcinolone cream (KENALOG) 0.1 %    Sig: Apply 1  application topically 2 (two) times daily.    Dispense:  30  g    Refill:  1    Follow-up: No follow-ups on file.    Carolann Littler, MD

## 2021-12-01 ENCOUNTER — Ambulatory Visit (INDEPENDENT_AMBULATORY_CARE_PROVIDER_SITE_OTHER): Payer: Medicare Other | Admitting: Family Medicine

## 2021-12-01 VITALS — BP 130/70 | HR 77 | Temp 98.0°F | Ht 74.0 in | Wt 247.0 lb

## 2021-12-01 DIAGNOSIS — E785 Hyperlipidemia, unspecified: Secondary | ICD-10-CM

## 2021-12-01 DIAGNOSIS — Z125 Encounter for screening for malignant neoplasm of prostate: Secondary | ICD-10-CM | POA: Diagnosis not present

## 2021-12-01 DIAGNOSIS — I10 Essential (primary) hypertension: Secondary | ICD-10-CM | POA: Diagnosis not present

## 2021-12-01 DIAGNOSIS — I7121 Aneurysm of the ascending aorta, without rupture: Secondary | ICD-10-CM | POA: Diagnosis not present

## 2021-12-01 DIAGNOSIS — R7303 Prediabetes: Secondary | ICD-10-CM

## 2021-12-01 LAB — BASIC METABOLIC PANEL
BUN: 17 mg/dL (ref 6–23)
CO2: 30 mEq/L (ref 19–32)
Calcium: 9.7 mg/dL (ref 8.4–10.5)
Chloride: 100 mEq/L (ref 96–112)
Creatinine, Ser: 0.97 mg/dL (ref 0.40–1.50)
GFR: 75.78 mL/min (ref >60.00)
Glucose, Bld: 112 mg/dL — ABNORMAL HIGH (ref 70–99)
Potassium: 4.6 mEq/L (ref 3.5–5.1)
Sodium: 136 mEq/L (ref 135–145)

## 2021-12-01 LAB — HEPATIC FUNCTION PANEL
ALT: 13 U/L (ref 0–53)
AST: 16 U/L (ref 0–37)
Albumin: 4.4 g/dL (ref 3.5–5.2)
Alkaline Phosphatase: 41 U/L (ref 39–117)
Bilirubin, Direct: 0.2 mg/dL (ref 0.0–0.3)
Total Bilirubin: 1.3 mg/dL — ABNORMAL HIGH (ref 0.2–1.2)
Total Protein: 6.8 g/dL (ref 6.0–8.3)

## 2021-12-01 LAB — LIPID PANEL
Cholesterol: 153 mg/dL (ref 0–200)
HDL: 59.3 mg/dL (ref >39.00)
LDL Cholesterol: 72 mg/dL (ref 0–99)
NonHDL: 94.07
Total CHOL/HDL Ratio: 3
Triglycerides: 108 mg/dL (ref 0.0–149.0)
VLDL: 21.6 mg/dL (ref 0.0–40.0)

## 2021-12-01 LAB — HEMOGLOBIN A1C: Hgb A1c MFr Bld: 6.3 % (ref 4.6–6.5)

## 2021-12-01 LAB — PSA, MEDICARE: PSA: 2.57 ng/ml (ref 0.10–4.00)

## 2021-12-01 NOTE — Progress Notes (Signed)
Established Patient Office Visit  Subjective:  Patient ID: Paul Townsend, male    DOB: 07-10-1946  Age: 76 y.o. MRN: [id number]  CC:  Chief Complaint  Patient presents with   Annual Exam    HPI Paul Townsend presents for annual medical follow-up. His chronic problems include hypertension, ascending aortic aneurysm, GERD, osteoarthritis, prediabetes, hyperlipidemia.  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  No recent chest pains.  He is walking 3 miles per day.  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  He is currently working on book #10 and is in process of getting the second book published.  He is very excited about that.  He is due for follow-up labs.  He is requesting PSA.  He has history of prediabetes range blood sugars.  No polyuria or polydipsia.  A1c has been fairly consistent with last 1 being 6.3%.  Health maintenance reviewed.  He has had flu vaccine this fall.  Previous hepatitis C screening negative.  Pneumonia vaccines complete.  Shingrix vaccine complete.  Tetanus due later this year.  Wt Readings from Last 3 Encounters:  12/01/21 247 lb (112 kg)  10/08/21 248 lb (112.5 kg)  05/04/21 241 lb 6.4 oz (109.5 kg)     Past Medical History:  Diagnosis Date   Arthritis    Hyperlipidemia    Hypertension     Past Surgical History:  Procedure Laterality Date   ORIF Rt femur Right 01/05/2014   TONSILLECTOMY     TOOTH EXTRACTION     3 teeth   TOTAL KNEE ARTHROPLASTY     VARICOSE VEIN SURGERY      Family History  Problem Relation Age of Onset   Cancer Mother        colon   Arthritis Father    Hypertension Father    Heart disease Father 50       MI    Social History   Socioeconomic History   Marital status: Married    Spouse name: Not on file   Number of children: Not on file   Years of  education: Not on file   Highest education level: Not on file  Occupational History   Not on file  Tobacco Use   Smoking status: Former   Smokeless tobacco: Never   Tobacco comments:    Did not review today due to time limits   Vaping Use   Vaping Use: Never used  Substance and Sexual Activity   Alcohol use: Yes    Comment: ooc   Drug use: Not on file   Sexual activity: Not on file  Other Topics Concern   Not on file  Social History Narrative   Not on file   Social Determinants of Health   Financial Resource Strain: Not on file  Food Insecurity: Not on file  Transportation Needs: Not on file  Physical Activity: Not on file  Stress: Not on file  Social Connections: Not on file  Intimate Partner Violence: Not on file    Outpatient Medications Prior to Visit  Medication Sig Dispense Refill   Ascorbic Acid (VITAMIN C PO) Take by mouth.     aspirin 81 MG chewable tablet Chew 81 mg by mouth.     Calcium Citrate-Vitamin D (CALCIUM + D PO) Take by mouth.     ezetimibe-simvastatin (VYTORIN) 10-20 MG  tablet Take 1 tablet by mouth daily.     hydrochlorothiazide (MICROZIDE) 12.5 MG capsule Take 1 capsule (12.5 mg total) by mouth daily. 90 capsule 3   lisinopril (ZESTRIL) 10 MG tablet TAKE 1 TABLET DAILY 90 tablet 3   Misc Natural Products (TART CHERRY ADVANCED PO) Take 1 capsule by mouth daily.     omeprazole (PRILOSEC OTC) 20 MG tablet Take by mouth.     Potassium 99 MG TABS Take 1 tablet by mouth daily.     triamcinolone cream (KENALOG) 0.1 % Apply 1 application topically 2 (two) times daily. 30 g 1   Zinc Acetate, Oral, (ZINC ACETATE PO) Take by mouth.     ezetimibe-simvastatin (VYTORIN) 10-20 MG tablet TAKE 1 TABLET AT BEDTIME 90 tablet 3   sildenafil (VIAGRA) 100 MG tablet Take 1 tablet (100 mg total) by mouth daily as needed for erectile dysfunction. 6 tablet 11   hydrochlorothiazide (HYDRODIURIL) 25 MG tablet Take 0.5 tablets by mouth every morning.     No  facility-administered medications prior to visit.    No Known Allergies  ROS Review of Systems  Constitutional:  Negative for appetite change, fatigue and unexpected weight change.  Eyes:  Negative for visual disturbance.  Respiratory:  Negative for cough, chest tightness and shortness of breath.   Cardiovascular:  Negative for chest pain, palpitations and leg swelling.  Gastrointestinal:  Negative for abdominal pain.  Endocrine: Negative for polydipsia and polyuria.  Genitourinary:  Negative for dysuria.  Neurological:  Negative for dizziness, syncope, weakness, light-headedness and headaches.     Objective:    Physical Exam Constitutional:      General: He is not in acute distress.    Appearance: He is well-developed.  HENT:     Head: Normocephalic and atraumatic.     Right Ear: External ear normal.     Left Ear: External ear normal.  Eyes:     Conjunctiva/sclera: Conjunctivae normal.  Neck:     Thyroid: No thyromegaly.  Cardiovascular:     Rate and Rhythm: Normal rate and regular rhythm.     Heart sounds: Normal heart sounds.  Pulmonary:     Effort: No respiratory distress.     Breath sounds: No wheezing or rales.  Abdominal:     General: Bowel sounds are normal. There is no distension.     Palpations: Abdomen is soft.     Tenderness: There is no abdominal tenderness. There is no guarding or rebound.     Comments: Small umbilical hernia which is soft and nontender  Musculoskeletal:     Cervical back: Normal range of motion and neck supple.     Right lower leg: No edema.     Left lower leg: No edema.  Lymphadenopathy:     Cervical: No cervical adenopathy.  Skin:    Findings: No rash.  Neurological:     Mental Status: He is alert and oriented to person, place, and time.     Cranial Nerves: No cranial nerve deficit.    BP 130/70 (BP Location: Left Arm, Patient Position: Sitting, Cuff Size: Normal)    Pulse 77    Temp 98 F (36.7 C) (Oral)    Ht 6\' 2"  (1.88 m)     Wt 247 lb (112 kg)    SpO2 98%    BMI 31.71 kg/m  Wt Readings from Last 3 Encounters:  12/01/21 247 lb (112 kg)  10/08/21 248 lb (112.5 kg)  05/04/21 241 lb 6.4 oz (109.5 kg)  Health Maintenance Due  Topic Date Due   COVID-19 Vaccine (6 - Booster for Moderna series) 11/18/2021    There are no preventive care reminders to display for this patient.  Lab Results  Component Value Date   TSH 1.75 11/30/2020   Lab Results  Component Value Date   WBC 7.7 11/30/2020   HGB 16.0 11/30/2020   HCT 46.8 11/30/2020   MCV 94.0 11/30/2020   PLT 205.0 11/30/2020   Lab Results  Component Value Date   NA 136 11/30/2020   K 4.1 11/30/2020   CO2 27 11/30/2020   GLUCOSE 110 (H) 11/30/2020   BUN 15 11/30/2020   CREATININE 0.90 04/14/2021   BILITOT 1.9 (H) 11/30/2020   ALKPHOS 45 11/30/2020   AST 17 11/30/2020   ALT 14 11/30/2020   PROT 7.0 11/30/2020   ALBUMIN 4.5 11/30/2020   CALCIUM 9.6 11/30/2020   ANIONGAP 12 09/25/2018   GFR 82.39 11/30/2020   Lab Results  Component Value Date   CHOL 145 11/30/2020   Lab Results  Component Value Date   HDL 56.60 11/30/2020   Lab Results  Component Value Date   LDLCALC 65 11/30/2020   Lab Results  Component Value Date   TRIG 118.0 11/30/2020   Lab Results  Component Value Date   CHOLHDL 3 11/30/2020   Lab Results  Component Value Date   HGBA1C 5.8 (A) 05/04/2021      Assessment & Plan:   #1 hypertension.  Stable by today's reading as well as home readings.  Continue regular exercise habits and weight control efforts.  Check basic metabolic panel  #2 hyperlipidemia.  Patient on Vytorin.  Check lipid and hepatic panel.  #3 history of prediabetes.  Recheck A1c.  #4 ascending aortic aneurysm-stable by MRA chest 5/22.  He has pending follow-up with cardiology soon  Health maintenance:  The natural history of prostate cancer and ongoing controversy regarding screening and potential treatment outcomes of prostate cancer  has been discussed with the patient. The meaning of a false positive PSA and a false negative PSA has been discussed. He indicates understanding of the limitations of this screening test and wishes to proceed with screening PSA testing.  -Continue annual flu vaccine    No orders of the defined types were placed in this encounter.   Follow-up: No follow-ups on file.    Carolann Littler, MD

## 2021-12-12 NOTE — Progress Notes (Signed)
CARDIOLOGY CONSULT NOTE       Patient ID: Paul Townsend MRN: [id number] DOB/AGE: 77-Sep-1947 77 y.o.  Admit date: (Not on file) Referring Physician: Burchette Primary Physician: Eulas Post, MD Primary Cardiologist: Johnsie Cancel Reason for Consultation: CAD/Aortic Aneurysm  HPI:  76 y.o. seen January 08 2019 CRF;s HTN, HLD and pre diabetes. Had pancreatitis November 2019 and CT noted 4.3 cm aortic aneurysm and coronary calcification  F/U myovue 03/19/19 normal with no ischemia EF 58% Was active with no cardiac symptoms On statin with good BP control F/U CTA 03/10/20 stable 4.3 cm ascending thoracic aortic aneurysm  April 2021 had palpitations and primary ordered telemetry monitor showed NSR nocturnal bradycardia first degree AV block no high grade block or significant sustained arrhythmias   Cardiac CTA done using Flash Protocol 04/14/21 No calcium score Aorta 4.1 cm with non obstructive CAD in RCA/LCX LAD <50% FFR normal D1 50-79% with FFR 0.79 distally and small D2 < 50% calcified plaque   BP well controlled He is on vytorin and ASA LDL 72 on labs 12/01/21   In [employer] and was in [place], [place], [place] the [place]   Walking with no symptoms 3 miles/day Sees chiropractor A lot for his back  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  Got his PHD from [facility] in [place] from [place] All other systems reviewed and negative except as noted above  Past Medical History:  Diagnosis Date   Arthritis    Hyperlipidemia    Hypertension     Family History  Problem Relation Age of Onset   Cancer Mother        colon   Arthritis Father    Hypertension Father    Heart disease Father 71       MI    Social History   Socioeconomic History   Marital status: Married    Spouse name: Not on file   Number of children: Not on file   Years of education: Not on file   Highest education level: Not on file  Occupational History   Not on file  Tobacco Use    Smoking status: Former   Smokeless tobacco: Never   Tobacco comments:    Did not review today due to time limits   Vaping Use   Vaping Use: Never used  Substance and Sexual Activity   Alcohol use: Yes    Comment: ooc   Drug use: Not on file   Sexual activity: Not on file  Other Topics Concern   Not on file  Social History Narrative   Not on file   Social Determinants of Health   Financial Resource Strain: Not on file  Food Insecurity: Not on file  Transportation Needs: Not on file  Physical Activity: Not on file  Stress: Not on file  Social Connections: Not on file  Intimate Partner Violence: Not on file    Past Surgical History:  Procedure Laterality Date   ORIF Rt femur Right 01/05/2014   TONSILLECTOMY     TOOTH EXTRACTION     3 teeth   TOTAL KNEE ARTHROPLASTY     VARICOSE VEIN SURGERY        Current Outpatient Medications:    Ascorbic Acid (VITAMIN C PO), Take by mouth., Disp: , Rfl:    aspirin 81 MG chewable tablet, Chew 81 mg by mouth., Disp: , Rfl:    Calcium Citrate-Vitamin D (CALCIUM + D PO), Take by mouth., Disp: ,  Rfl:    ezetimibe-simvastatin (VYTORIN) 10-20 MG tablet, Take 1 tablet by mouth daily., Disp: , Rfl:    hydrochlorothiazide (MICROZIDE) 12.5 MG capsule, Take 1 capsule (12.5 mg total) by mouth daily., Disp: 90 capsule, Rfl: 3   lisinopril (ZESTRIL) 10 MG tablet, TAKE 1 TABLET DAILY, Disp: 90 tablet, Rfl: 3   Misc Natural Products (TART CHERRY ADVANCED PO), Take 1 capsule by mouth daily., Disp: , Rfl:    omeprazole (PRILOSEC OTC) 20 MG tablet, Take by mouth., Disp: , Rfl:    Potassium 99 MG TABS, Take 1 tablet by mouth daily., Disp: , Rfl:    triamcinolone cream (KENALOG) 0.1 %, Apply 1 application topically 2 (two) times daily., Disp: 30 g, Rfl: 1   Zinc Acetate, Oral, (ZINC ACETATE PO), Take by mouth., Disp: , Rfl:     Physical Exam: Blood pressure 130/72, pulse 90, height 6\' 2"  (1.88 m), weight 249 lb (112.9 kg), SpO2 94 %.    Affect  appropriate Healthy:  appears stated age 68: normal Neck supple with no adenopathy JVP normal no bruits no thyromegaly Lungs clear with no wheezing and good diaphragmatic motion Heart:  S1/S2 no murmur, no rub, gallop or click PMI normal Abdomen: benighn, BS positve, no tenderness, no AAA no bruit.  No HSM or HJR Distal pulses intact with no bruits No edema Neuro non-focal Skin warm and dry No muscular weakness    Labs:   Lab Results  Component Value Date   WBC 7.7 11/30/2020   HGB 16.0 11/30/2020   HCT 46.8 11/30/2020   MCV 94.0 11/30/2020   PLT 205.0 11/30/2020   No results for input(s): NA, K, CL, CO2, BUN, CREATININE, CALCIUM, PROT, BILITOT, ALKPHOS, ALT, AST, GLUCOSE in the last 168 hours.  Invalid input(s): LABALBU No results found for: CKTOTAL, CKMB, CKMBINDEX, TROPONINI  Lab Results  Component Value Date   CHOL 153 12/01/2021   CHOL 145 11/30/2020   CHOL 159 11/26/2019   Lab Results  Component Value Date   HDL 59.30 12/01/2021   HDL 56.60 11/30/2020   HDL 58.40 11/26/2019   Lab Results  Component Value Date   LDLCALC 72 12/01/2021   LDLCALC 65 11/30/2020   LDLCALC 74 11/26/2019   Lab Results  Component Value Date   TRIG 108.0 12/01/2021   TRIG 118.0 11/30/2020   TRIG 136.0 11/26/2019   Lab Results  Component Value Date   CHOLHDL 3 12/01/2021   CHOLHDL 3 11/30/2020   CHOLHDL 3 11/26/2019   Lab Results  Component Value Date   LDLDIRECT 114.7 08/19/2013      Radiology: No results found.  EKG: SR rate 84 Q wave in lead 2 PVC 01/08/19 12/20/2021 SR rate 90 nonspecific ST changes    ASSESSMENT AND PLAN:   1. Thoracic Aneurysm:  Stable 4.1 cm CTA 04/14/21 2. HTN:  Well controlled.  Continue current medications and low sodium Dash type diet.   3. HLD:  On vytorin LDL 72 12/01/21  4. GERD:  Low carb diet PRN Prilosec  5. CAD:  Normal myovue 2020 Cardiac CTA 04/14/21 with only obstructive dx in D1 FFR 0.79 medical Rx  ASA/Vytorin     F/U in a  year   Signed: Jenkins Rouge 12/20/2021, 3:09 PM

## 2021-12-20 ENCOUNTER — Other Ambulatory Visit: Payer: Self-pay

## 2021-12-20 ENCOUNTER — Encounter: Payer: Self-pay | Admitting: Cardiovascular Disease

## 2021-12-20 ENCOUNTER — Ambulatory Visit (INDEPENDENT_AMBULATORY_CARE_PROVIDER_SITE_OTHER): Payer: Medicare Other | Admitting: Cardiovascular Disease

## 2021-12-20 VITALS — BP 130/72 | HR 90 | Ht 74.0 in | Wt 249.0 lb

## 2021-12-20 DIAGNOSIS — I10 Essential (primary) hypertension: Secondary | ICD-10-CM | POA: Diagnosis not present

## 2021-12-20 DIAGNOSIS — I2510 Atherosclerotic heart disease of native coronary artery without angina pectoris: Secondary | ICD-10-CM | POA: Diagnosis not present

## 2021-12-20 DIAGNOSIS — E782 Mixed hyperlipidemia: Secondary | ICD-10-CM | POA: Diagnosis not present

## 2021-12-20 DIAGNOSIS — I712 Thoracic aortic aneurysm, without rupture, unspecified: Secondary | ICD-10-CM | POA: Diagnosis not present

## 2021-12-20 NOTE — Patient Instructions (Signed)
Medication Instructions:  Your physician recommends that you continue on your current medications as directed. Please refer to the Current Medication list given to you today.  *If you need a refill on your cardiac medications before your next appointment, please call your pharmacy*  Lab Work: If you have labs (blood work) drawn today and your tests are completely normal, you will receive your results only by: [place] (if you have MyChart) OR A paper copy in the mail If you have any lab test that is abnormal or we need to change your treatment, we will call you to review the results.  Follow-Up: At [facility], you and your health needs are our priority.  As part of our continuing mission to provide you with exceptional heart care, we have created designated Provider Care Teams.  These Care Teams include your primary Cardiologist (physician) and Advanced Practice Providers (APPs -  Physician Assistants and Nurse Practitioners) who all work together to provide you with the care you need, when you need it.  We recommend signing up for the patient portal called "MyChart".  Sign up information is provided on this After Visit Summary.  MyChart is used to connect with patients for Virtual Visits (Telemedicine).  Patients are able to view lab/test results, encounter notes, upcoming appointments, etc.  Non-urgent messages can be sent to your provider as well.   To learn more about what you can do with MyChart, go to [email or web address].    Your next appointment:   1 year(s)  The format for your next appointment:   In Person  Provider:   Jenkins Rouge, MD {

## 2022-02-01 ENCOUNTER — Ambulatory Visit (INDEPENDENT_AMBULATORY_CARE_PROVIDER_SITE_OTHER): Payer: Medicare Other

## 2022-02-01 VITALS — BP 126/64 | HR 86 | Temp 98.0°F | Ht 74.5 in | Wt 248.4 lb

## 2022-02-01 DIAGNOSIS — Z0000 Encounter for general adult medical examination without abnormal findings: Secondary | ICD-10-CM | POA: Diagnosis not present

## 2022-02-01 NOTE — Progress Notes (Signed)
?This visit occurred during the SARS-CoV-2 public health emergency.  Safety protocols were in place, including screening questions prior to the visit, additional usage of staff PPE, and extensive cleaning of exam room while observing appropriate contact time as indicated for disinfecting solutions. ? ?Subjective:  ? Paul Townsend is a 76 y.o. male who presents for Medicare Annual/Subsequent preventive examination. ? ?Review of Systems    ? ?Cardiac Risk Factors include: advanced age (>77mn, >>83women);dyslipidemia;hypertension;male gender ? ?   ?Objective:  ?  ?Today's Vitals  ? 02/01/22 1547  ?BP: 126/64  ?Pulse: 86  ?Temp: 98 ?F (36.7 ?C)  ?TempSrc: Oral  ?SpO2: 96%  ?Weight: 248 lb 6.4 oz (112.7 kg)  ?Height: 6' 2.5" (1.892 m)  ? ?Body mass index is 31.47 kg/m?. ? ? ?  02/01/2022  ?  3:54 PM 09/25/2018  ? 10:21 AM 10/20/2017  ?  9:53 AM  ?Advanced Directives  ?Does Patient Have a Medical Advance Directive? Yes Yes Yes  ?Type of AParamedicof [place] will [place] will   ?Copy of [place]? No - copy requested    ?Would patient like information on creating a medical advance directive?  No - Patient declined   ? ? ?Current Medications (verified) ?Outpatient Encounter Medications as of 02/01/2022  ?Medication Sig  ? Ascorbic Acid (VITAMIN C PO) Take by mouth.  ? aspirin 81 MG chewable tablet Chew 81 mg by mouth.  ? Calcium Citrate-Vitamin D (CALCIUM + D PO) Take by mouth.  ? ezetimibe-simvastatin (VYTORIN) 10-20 MG tablet Take 1 tablet by mouth daily.  ? hydrochlorothiazide (MICROZIDE) 12.5 MG capsule Take 1 capsule (12.5 mg total) by mouth daily.  ? lisinopril (ZESTRIL) 10 MG tablet TAKE 1 TABLET DAILY  ? Misc Natural Products (TART CHERRY ADVANCED PO) Take 1 capsule by mouth daily.  ? omeprazole (PRILOSEC OTC) 20 MG tablet Take by mouth.  ? Potassium 99 MG TABS Take 1 tablet by mouth daily.  ? vitamin B-12 (CYANOCOBALAMIN)  1000 MCG tablet Take 1,000 mcg by mouth daily.  ? Zinc Acetate, Oral, (ZINC ACETATE PO) Take by mouth.  ? triamcinolone cream (KENALOG) 0.1 % Apply 1 application topically 2 (two) times daily. (Patient not taking: Reported on 02/01/2022)  ? ?No facility-administered encounter medications on file as of 02/01/2022.  ? ? ?Allergies (verified) ?Patient has no known allergies.  ? ?History: ?Past Medical History:  ?Diagnosis Date  ? Arthritis   ? Hyperlipidemia   ? Hypertension   ? ?Past Surgical History:  ?Procedure Laterality Date  ? ORIF Rt femur Right 01/05/2014  ? TONSILLECTOMY    ? TOOTH EXTRACTION    ? 3 teeth  ? TOTAL KNEE ARTHROPLASTY    ? VARICOSE VEIN SURGERY    ? ?Family History  ?Problem Relation Age of Onset  ? Cancer Mother   ?     colon  ? Arthritis Father   ? Hypertension Father   ? Heart disease Father 856 ?     MI  ? ?Social History  ? ?Socioeconomic History  ? Marital status: Married  ?  Spouse name: Not on file  ? Number of children: Not on file  ? Years of education: Not on file  ? Highest education level: Not on file  ?Occupational History  ? Not on file  ?Tobacco Use  ? Smoking status: Former  ? Smokeless tobacco: Never  ? Tobacco comments:  ?  Did not review today  due to time limits   ?Vaping Use  ? Vaping Use: Never used  ?Substance and Sexual Activity  ? Alcohol use: Yes  ?  Alcohol/week: 7.0 standard drinks  ?  Types: 7 Glasses of wine per week  ?  Comment: wine  ? Drug use: Not on file  ? Sexual activity: Not on file  ?Other Topics Concern  ? Not on file  ?Social History Narrative  ? Not on file  ? ?Social Determinants of Health  ? ?Financial Resource Strain: Low Risk   ? Difficulty of Paying Living Expenses: Not hard at all  ?Food Insecurity: No Food Insecurity  ? Worried About Charity fundraiser in the Last Year: Never true  ? Ran Out of Food in the Last Year: Never true  ?Transportation Needs: No Transportation Needs  ? Lack of Transportation (Medical): No  ? Lack of Transportation  (Non-Medical): No  ?Physical Activity: Sufficiently Active  ? Days of Exercise per Week: 6 days  ? Minutes of Exercise per Session: 50 min  ?Stress: No Stress Concern Present  ? Feeling of Stress : Not at all  ?Social Connections: Not on file  ? ? ?Tobacco Counseling ?Counseling given: Not Answered ?Tobacco comments: Did not review today due to time limits  ? ? ?Clinical Intake: ? ?Pre-visit preparation completed: Yes ? ?Pain : No/denies pain ? ?  ? ?Nutritional Status: BMI > 30  Obese ?Nutritional Risks: None ?Diabetes: No ? ?How often do you need to have someone help you when you read instructions, pamphlets, or other written materials from your doctor or pharmacy?: 1 - Never ? ?Diabetic? no ? ?Interpreter Needed?: No ? ?Information entered by :: NAllen LPN ? ? ?Activities of Daily Living ? ?  02/01/2022  ?  3:56 PM  ?In your present state of health, do you have any difficulty performing the following activities:  ?Hearing? 0  ?Vision? 0  ?Difficulty concentrating or making decisions? 0  ?Walking or climbing stairs? 0  ?Dressing or bathing? 0  ?Doing errands, shopping? 0  ?Preparing Food and eating ? N  ?Using the Toilet? N  ?In the past six months, have you accidently leaked urine? N  ?Do you have problems with loss of bowel control? N  ?Managing your Medications? N  ?Managing your Finances? N  ?Housekeeping or managing your Housekeeping? N  ? ? ?Patient Care Team: ?Eulas Post, MD as PCP - General ?Josue Hector, MD as PCP - Cardiology (Cardiology) ? ?Indicate any recent Medical Services you may have received from other than [facility] providers in the past year (date may be approximate). ? ?   ?Assessment:  ? This is a routine wellness examination for Paul Townsend. ? ?Hearing/Vision screen ?Vision Screening - Comments:: Regular eye exams, Dr. Ellie Lunch ? ?Dietary issues and exercise activities discussed: ?Current Exercise Habits: Home exercise routine, Type of exercise: walking, Time (Minutes): 50, Frequency  (Times/Week): 6, Weekly Exercise (Minutes/Week): 300 ? ? Goals Addressed   ? ?  ?  ?  ?  ? This Visit's Progress  ?  Patient Stated     ?  02/01/2022, lose some weight ?  ? ?  ? ?Depression Screen ? ?  02/01/2022  ?  3:56 PM 12/01/2021  ?  9:17 AM 10/08/2021  ?  8:00 AM 11/26/2019  ?  7:54 AM 10/19/2018  ?  8:52 AM 10/20/2017  ?  9:18 PM 10/18/2016  ?  2:37 PM  ?PHQ 2/9 Scores  ?PHQ - 2  Score 0 0 0 1 2 0 0  ?PHQ- 9 Score    4 5    ?  ?Fall Risk ? ?  02/01/2022  ?  3:55 PM 12/01/2021  ?  9:18 AM 10/08/2021  ?  8:00 AM 11/30/2020  ?  9:00 AM 11/26/2019  ?  7:56 AM  ?Fall Risk   ?Falls in the past year? 0 0 0 0 0  ?Number falls in past yr:   0    ?Injury with Fall?   0    ?Risk for fall due to : Medication side effect  No Fall Risks    ?Follow up Falls evaluation completed;Education provided;Falls prevention discussed  Falls evaluation completed    ? ? ?FALL RISK PREVENTION PERTAINING TO THE HOME: ? ?Any stairs in or around the home? No  ?If so, are there any without handrails?  N/a ?Home free of loose throw rugs in walkways, pet beds, electrical cords, etc? Yes  ?Adequate lighting in your home to reduce risk of falls? Yes  ? ?ASSISTIVE DEVICES UTILIZED TO PREVENT FALLS: ? ?Life alert? No  ?Use of a cane, walker or w/c? No  ?Grab bars in the bathroom? No  ?Shower chair or bench in shower? Yes  ?Elevated toilet seat or a handicapped toilet? Yes  ? ?TIMED UP AND GO: ? ?Was the test performed? No .  ? ? ?Gait steady and fast without use of assistive device ? ?Cognitive Function: ?  ?  ? ?  02/01/2022  ?  3:59 PM  ?6CIT Screen  ?What Year? 0 points  ?What month? 0 points  ?What time? 0 points  ?Count back from 20 0 points  ?Months in reverse 0 points  ?Repeat phrase 0 points  ?Total Score 0 points  ? ? ?Immunizations ?Immunization History  ?Administered Date(s) Administered  ? Fluad Quad(high Dose 65+) 07/16/2019, 08/13/2020  ? Hepatitis A 04/16/2012  ? Hepatitis A, Adult 05/20/2016  ? Influenza Whole 08/23/2010  ? Influenza, High  Dose Seasonal PF 10/29/2014, 09/18/2015, 10/18/2016  ? Influenza,inj,Quad PF,6+ Mos 08/19/2013, 07/30/2018  ? Influenza-Unspecified 09/18/2015, 07/11/2017, 09/23/2021  ? Moderna Sars-Covid-2 Vaccination 12/20/2019, 03/12/

## 2022-02-01 NOTE — Patient Instructions (Signed)
Paul Townsend , ?Thank you for taking time to come for your Medicare Wellness Visit. I appreciate your ongoing commitment to your health goals. Please review the following plan we discussed and let me know if I can assist you in the future.  ? ?Screening recommendations/referrals: ?Colonoscopy: not required ?Recommended yearly ophthalmology/optometry visit for glaucoma screening and checkup ?Recommended yearly dental visit for hygiene and checkup ? ?Vaccinations: ?Influenza vaccine: completed 09/23/2021, due next flu season ?Pneumococcal vaccine: completed 05/27/2015 ?Tdap vaccine: completed 04/19/2012, due 04/19/2022 ?Shingles vaccine: completed   ?Covid-19:  09/23/2021, 04/27/2021, 09/12/2020, 01/17/2020, 12/20/2019 ? ?Advanced directives: Please bring a copy of your POA (Power of Attorney) and/or Living Will to your next appointment.  ? ? ?Conditions/risks identified: none ? ?Next appointment: Follow up in one year for your annual wellness visit.  ? ?Preventive Care 40 Years and Older, Male ?Preventive care refers to lifestyle choices and visits with your health care provider that can promote health and wellness. ?What does preventive care include? ?A yearly physical exam. This is also called an annual well check. ?Dental exams once or twice a year. ?Routine eye exams. Ask your health care provider how often you should have your eyes checked. ?Personal lifestyle choices, including: ?Daily care of your teeth and gums. ?Regular physical activity. ?Eating a healthy diet. ?Avoiding tobacco and drug use. ?Limiting alcohol use. ?Practicing safe sex. ?Taking low doses of aspirin every day. ?Taking vitamin and mineral supplements as recommended by your health care provider. ?What happens during an annual well check? ?The services and screenings done by your health care provider during your annual well check will depend on your age, overall health, lifestyle risk factors, and family history of disease. ?Counseling  ?Your health care  provider may ask you questions about your: ?Alcohol use. ?Tobacco use. ?Drug use. ?Emotional well-being. ?Home and relationship well-being. ?Sexual activity. ?Eating habits. ?History of falls. ?Memory and ability to understand (cognition). ?Work and work Statistician. ?Screening  ?You may have the following tests or measurements: ?Height, weight, and BMI. ?Blood pressure. ?Lipid and cholesterol levels. These may be checked every 5 years, or more frequently if you are over 28 years old. ?Skin check. ?Lung cancer screening. You may have this screening every year starting at age 24 if you have a 30-pack-year history of smoking and currently smoke or have quit within the past 15 years. ?Fecal occult blood test (FOBT) of the stool. You may have this test every year starting at age 59. ?Flexible sigmoidoscopy or colonoscopy. You may have a sigmoidoscopy every 5 years or a colonoscopy every 10 years starting at age 63. ?Prostate cancer screening. Recommendations will vary depending on your family history and other risks. ?Hepatitis C blood test. ?Hepatitis B blood test. ?Sexually transmitted disease (STD) testing. ?Diabetes screening. This is done by checking your blood sugar (glucose) after you have not eaten for a while (fasting). You may have this done every 1-3 years. ?Abdominal aortic aneurysm (AAA) screening. You may need this if you are a current or former smoker. ?Osteoporosis. You may be screened starting at age 84 if you are at high risk. ?Talk with your health care provider about your test results, treatment options, and if necessary, the need for more tests. ?Vaccines  ?Your health care provider may recommend certain vaccines, such as: ?Influenza vaccine. This is recommended every year. ?Tetanus, diphtheria, and acellular pertussis (Tdap, Td) vaccine. You may need a Td booster every 10 years. ?Zoster vaccine. You may need this after age 57. ?Pneumococcal 13-valent  conjugate (PCV13) vaccine. One dose is  recommended after age 76. ?Pneumococcal polysaccharide (PPSV23) vaccine. One dose is recommended after age 86. ?Talk to your health care provider about which screenings and vaccines you need and how often you need them. ?This information is not intended to replace advice given to you by your health care provider. Make sure you discuss any questions you have with your health care provider. ?Document Released: 11/20/2015 Document Revised: 07/13/2016 Document Reviewed: 08/25/2015 ?Elsevier Interactive Patient Education ? 2017 [employer]. ? ?Fall Prevention in the Home ?Falls can cause injuries. They can happen to people of all ages. There are many things you can do to make your home safe and to help prevent falls. ?What can I do on the outside of my home? ?Regularly fix the edges of walkways and driveways and fix any cracks. ?Remove anything that might make you trip as you walk through a door, such as a raised step or threshold. ?Trim any bushes or trees on the path to your home. ?Use bright outdoor lighting. ?Clear any walking paths of anything that might make someone trip, such as rocks or tools. ?Regularly check to see if handrails are loose or broken. Make sure that both sides of any steps have handrails. ?Any raised decks and porches should have guardrails on the edges. ?Have any leaves, snow, or ice cleared regularly. ?Use sand or salt on walking paths during winter. ?Clean up any spills in your garage right away. This includes oil or grease spills. ?What can I do in the bathroom? ?Use night lights. ?Install grab bars by the toilet and in the tub and shower. Do not use towel bars as grab bars. ?Use non-skid mats or decals in the tub or shower. ?If you need to sit down in the shower, use a plastic, non-slip stool. ?Keep the floor dry. Clean up any water that spills on the floor as soon as it happens. ?Remove soap buildup in the tub or shower regularly. ?Attach bath mats securely with double-sided non-slip rug  tape. ?Do not have throw rugs and other things on the floor that can make you trip. ?What can I do in the bedroom? ?Use night lights. ?Make sure that you have a light by your bed that is easy to reach. ?Do not use any sheets or blankets that are too big for your bed. They should not hang down onto the floor. ?Have a firm chair that has side arms. You can use this for support while you get dressed. ?Do not have throw rugs and other things on the floor that can make you trip. ?What can I do in the kitchen? ?Clean up any spills right away. ?Avoid walking on wet floors. ?Keep items that you use a lot in easy-to-reach places. ?If you need to reach something above you, use a strong step stool that has a grab bar. ?Keep electrical cords out of the way. ?Do not use floor polish or wax that makes floors slippery. If you must use wax, use non-skid floor wax. ?Do not have throw rugs and other things on the floor that can make you trip. ?What can I do with my stairs? ?Do not leave any items on the stairs. ?Make sure that there are handrails on both sides of the stairs and use them. Fix handrails that are broken or loose. Make sure that handrails are as long as the stairways. ?Check any carpeting to make sure that it is firmly attached to the stairs. Fix any carpet that  is loose or worn. ?Avoid having throw rugs at the top or bottom of the stairs. If you do have throw rugs, attach them to the floor with carpet tape. ?Make sure that you have a light switch at the top of the stairs and the bottom of the stairs. If you do not have them, ask someone to add them for you. ?What else can I do to help prevent falls? ?Wear shoes that: ?Do not have high heels. ?Have rubber bottoms. ?Are comfortable and fit you well. ?Are closed at the toe. Do not wear sandals. ?If you use a stepladder: ?Make sure that it is fully opened. Do not climb a closed stepladder. ?Make sure that both sides of the stepladder are locked into place. ?Ask someone to  hold it for you, if possible. ?Clearly mark and make sure that you can see: ?Any grab bars or handrails. ?First and last steps. ?Where the edge of each step is. ?Use tools that help you move around (mobility aid

## 2022-03-18 IMAGING — MR MR MRA CHEST W/ OR W/O CM
13 series · 16 of 16 positions shown · IV contrast (multihance)
Comparison: 03/10/2020

CLINICAL DATA: 4 cm thoracic aortic aneurysm

EXAM:
MRA CHEST WITH OR WITHOUT CONTRAST
TECHNIQUE: Angiographic images of the chest were obtained using MRA technique
without and with intravenous contrast.
CONTRAST:  19mL MULTIHANCE GADOBENATE DIMEGLUMINE 529 MG/ML IV SOLN

[Series 3: bSSFP · axial · 6.0mm · 1.48mm/px · 1 of 38 slices shown (1 of 2)]
[im 1/38]
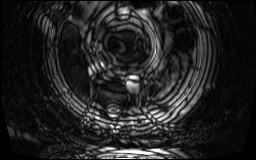

[Series 4: T2 · axial · 5.0mm · 1.48mm/px · 1 of 49 slices shown]
[im 1/49]
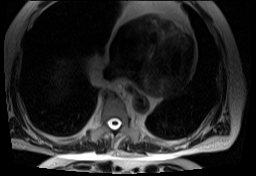

[Series 5: T1 · axial · 5.0mm · 1.48mm/px · 1 of 50 slices shown]
[im 1/50]
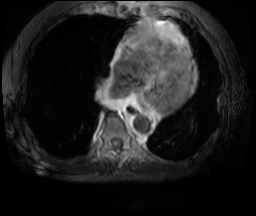

[Series 6: T1 dynamic · axial · non-contrast · 2.5mm · 0.74mm/px · 1 of 104 slices shown]
[im 1/104]
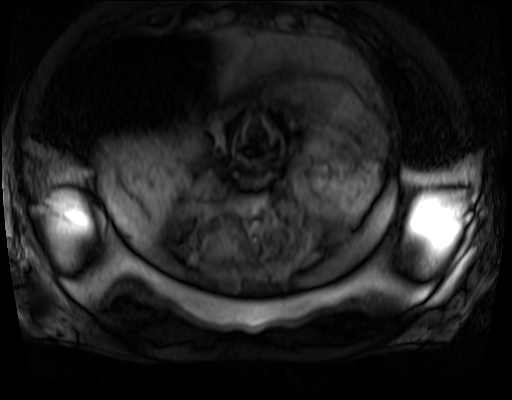

[Series 7: bSSFP · sagittal · 4.0mm · 1.64mm/px · 1 of 20 slices shown (2 of 2)]
[im 1/20]
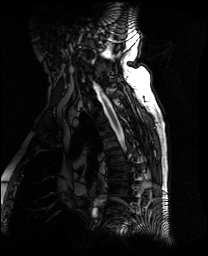

[Series 9: fl3d_cor_pre_tt=1.0s · sagittal · 1.5mm · 1.15mm/px · 1 of 104 slices shown]
[im 1/104]
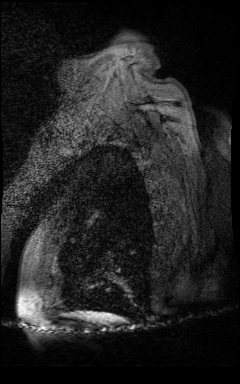

[Series 10: care bolus · sagittal · 150.0mm · 1.56mm/px · 1 of 44 slices shown]
[im 1/44]
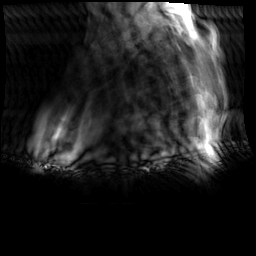

[Series 11: fl3d_cor_post_tt=1.0s · sagittal · 1.5mm · 1.15mm/px · 1 of 104 slices shown]
[im 1/104]
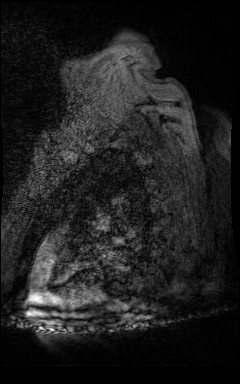

[Series 12: fl3d_cor_post_tt=1.0s_sub · sagittal · 1.5mm · 1.15mm/px · 2 of 104 slices shown]
[im 1/104]
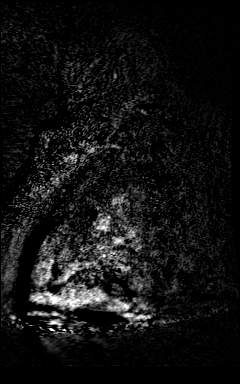
[im 104/104]
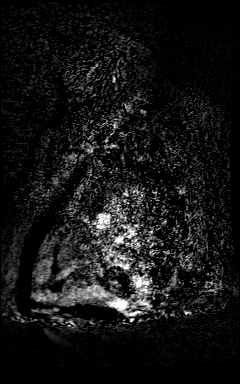

[Series 15: T1 fat-sat post-contrast · axial · 5.0mm · 1.48mm/px · 1 of 32 slices shown]
[im 1/32]
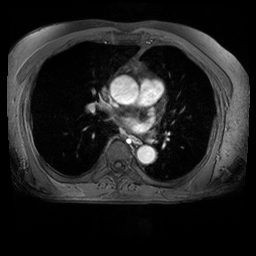

[Series 16: post flash candy · sagittal · 4.0mm · 1.48mm/px · 1 of 26 slices shown]
[im 1/26]
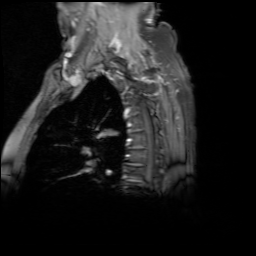

[Series 17: T1 dynamic post-contrast · axial · 2.5mm · 0.74mm/px · z∈[-189,+68]mm · 2 of 104 slices shown]
[im 1/104]
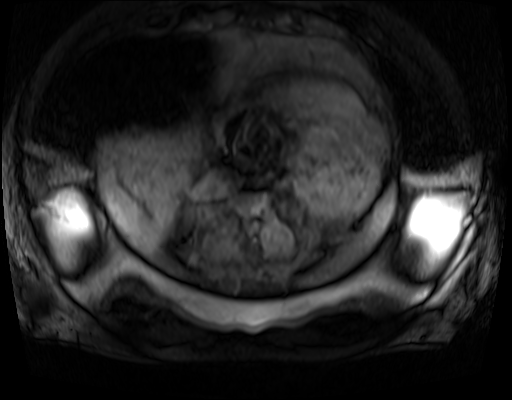
[im 104/104]
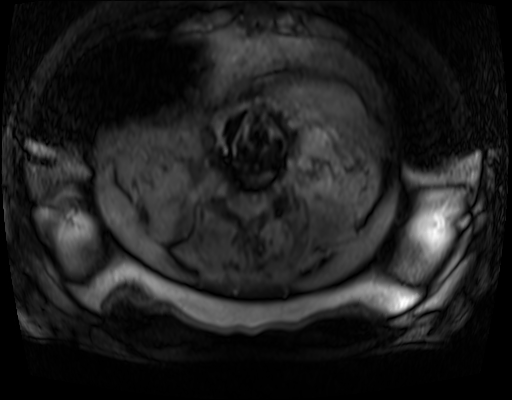

[Series 18: post vibe_sub · axial · 2.5mm · 0.74mm/px · z∈[-189,+68]mm · 2 of 104 slices shown]
[im 1/104]
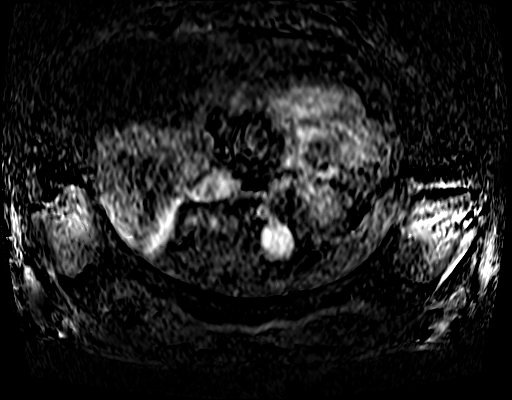
[im 104/104]
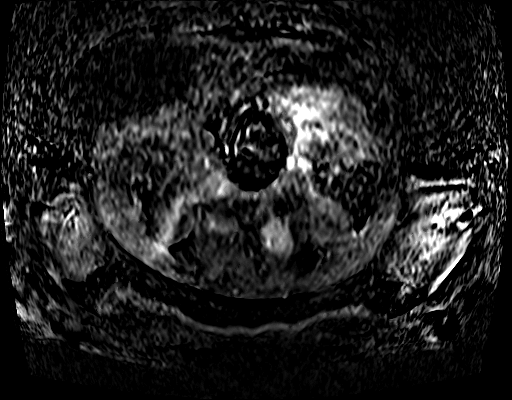

[16 of 16 positions shown; findings below may reference images not displayed]

FINDINGS: VASCULAR

Aorta: Similar mild fusiform aneurysmal dilatation of the ascending
thoracic aorta measuring 4.3 cm in greatest diameter, unchanged.
Remainder of the visualized aorta is normal in caliber. Negative for
dissection. No mediastinal hemorrhage or hematoma. Patent 3 vessel
arch anatomy. No pericardial effusion. Normal heart size.

Visualized central pulmonary arteries appear patent and normal in
caliber. No large filling defect or pulmonary embolus appreciated.
Central venous structures are all patent. No anomalous pulmonary
venous return appreciated.

Heart: Grossly normal size.

Pulmonary Arteries:  Normal caliber and appear patent.

Other: Carotid bifurcations appear patent.

NON-VASCULAR

No focal acute airspace process. Minimal dependent basilar
atelectasis. No pleural effusion. No other pleural abnormality or
pneumothorax. No chest wall focal abnormality or asymmetry. No
adenopathy appreciated.
IMPRESSION: VASCULAR

Stable 4.3 cm aneurysm of the ascending thoracic aorta.

Recommend annual imaging followup by CTA or MRA. This recommendation
follows 9080 ACCF/AHA/AATS/ACR/ASA/SCA/LILIA/SEBASTIAN ALEXANDER/OOGIES/JUN Guidelines
for the Diagnosis and Management of Patients with Thoracic Aortic
Disease. Circulation. 9080; 121: E266-e369. Aortic aneurysm NOS
([id number].C)

NON-VASCULAR

No other acute finding.

## 2022-04-25 ENCOUNTER — Encounter: Payer: Self-pay | Admitting: Family Medicine

## 2022-04-25 ENCOUNTER — Ambulatory Visit (INDEPENDENT_AMBULATORY_CARE_PROVIDER_SITE_OTHER): Payer: Medicare Other | Admitting: Family Medicine

## 2022-04-25 VITALS — BP 130/62 | HR 80 | Temp 97.8°F | Ht 74.5 in | Wt 245.4 lb

## 2022-04-25 DIAGNOSIS — I2510 Atherosclerotic heart disease of native coronary artery without angina pectoris: Secondary | ICD-10-CM | POA: Diagnosis not present

## 2022-04-25 DIAGNOSIS — J320 Chronic maxillary sinusitis: Secondary | ICD-10-CM

## 2022-04-25 MED ORDER — AMOXICILLIN-POT CLAVULANATE 875-125 MG PO TABS: 1.0000 | ORAL_TABLET | Freq: Two times a day (BID) | ORAL | 0 refills | Status: DC

## 2022-04-25 NOTE — Progress Notes (Signed)
   Established Patient Office Visit  Subjective   Patient ID: Paul Townsend, male    DOB: 03-09-1946  Age: 76 y.o. MRN: [id number]  Chief Complaint  Patient presents with   Sinusitis    Patient complains of sinusitis, x2 weeks    HPI   2-week history of left facial pain.  He had some intermittent malaise and intermittent facial pain and congestion.  Went to dentist and had x-rays which showed some left maxillary fullness.  Was placed on Zithromax but did not see any improvement.  Has been doing some nasal saline rinses.  No fever or chills.  No purulent secretions.  No history of recent sinusitis.  Past Medical History:  Diagnosis Date   Arthritis    Hyperlipidemia    Hypertension    Past Surgical History:  Procedure Laterality Date   ORIF Rt femur Right 01/05/2014   TONSILLECTOMY     TOOTH EXTRACTION     3 teeth   TOTAL KNEE ARTHROPLASTY     VARICOSE VEIN SURGERY      reports that he has quit smoking. He has never used smokeless tobacco. He reports current alcohol use of about 7.0 standard drinks of alcohol per week. No history on file for drug use. family history includes Arthritis in his father; Cancer in his mother; Heart disease (age of onset: 57) in his father; Hypertension in his father. No Known Allergies  Review of Systems  Constitutional:  Negative for chills and fever.  HENT:  Positive for sinus pain. Negative for ear discharge and ear pain.   Neurological:  Negative for headaches.      Objective:     BP 130/62 (BP Location: Left Arm, Patient Position: Sitting, Cuff Size: Normal)   Pulse 80   Temp 97.8 F (36.6 C) (Oral)   Ht 6' 2.5" (1.892 m)   Wt 245 lb 6.4 oz (111.3 kg)   SpO2 99%   BMI 31.09 kg/m    Physical Exam Vitals reviewed.  Constitutional:      Appearance: Normal appearance.  HENT:     Right Ear: Tympanic membrane normal.     Left Ear: Tympanic membrane normal.     Mouth/Throat:     Mouth: Mucous membranes are moist.      Pharynx: Oropharynx is clear.  Cardiovascular:     Rate and Rhythm: Normal rate and regular rhythm.  Neurological:     Mental Status: He is alert.      No results found for any visits on 04/25/22.    The 10-year ASCVD risk score (Arnett DK, et al., 2019) is: 29.3%    Assessment & Plan:   Problem List Items Addressed This Visit   None Visit Diagnoses     Left maxillary sinusitis    -  Primary   Relevant Medications   amoxicillin-clavulanate (AUGMENTIN) 875-125 MG tablet     Start Augmentin 875 mg twice daily with food for 10 days Stay well-hydrated Follow-up for any persistent or worsening symptoms.  No follow-ups on file.    Carolann Littler, MD

## 2022-04-26 DIAGNOSIS — Z96653 Presence of artificial knee joint, bilateral: Secondary | ICD-10-CM | POA: Diagnosis not present

## 2022-04-26 DIAGNOSIS — Z471 Aftercare following joint replacement surgery: Secondary | ICD-10-CM | POA: Diagnosis not present

## 2022-04-26 DIAGNOSIS — M25461 Effusion, right knee: Secondary | ICD-10-CM | POA: Diagnosis not present

## 2022-04-26 DIAGNOSIS — M25462 Effusion, left knee: Secondary | ICD-10-CM | POA: Diagnosis not present

## 2022-05-04 ENCOUNTER — Other Ambulatory Visit: Payer: Self-pay

## 2022-05-04 MED ORDER — TRIAMCINOLONE ACETONIDE 0.1 % EX CREA: 1.0000 | TOPICAL_CREAM | Freq: Two times a day (BID) | CUTANEOUS | 1 refills | Status: DC

## 2022-05-12 ENCOUNTER — Other Ambulatory Visit: Payer: Self-pay | Admitting: Family Medicine

## 2022-05-16 ENCOUNTER — Other Ambulatory Visit: Payer: Self-pay

## 2022-05-16 MED ORDER — LISINOPRIL 10 MG PO TABS: ORAL_TABLET | ORAL | 3 refills | Status: DC

## 2022-08-18 DIAGNOSIS — Z23 Encounter for immunization: Secondary | ICD-10-CM | POA: Diagnosis not present

## 2022-08-29 ENCOUNTER — Other Ambulatory Visit: Payer: Self-pay | Admitting: Family Medicine

## 2022-08-31 DIAGNOSIS — Z961 Presence of intraocular lens: Secondary | ICD-10-CM | POA: Diagnosis not present

## 2022-08-31 DIAGNOSIS — H524 Presbyopia: Secondary | ICD-10-CM | POA: Diagnosis not present

## 2022-08-31 DIAGNOSIS — H35372 Puckering of macula, left eye: Secondary | ICD-10-CM | POA: Diagnosis not present

## 2022-11-11 ENCOUNTER — Ambulatory Visit (INDEPENDENT_AMBULATORY_CARE_PROVIDER_SITE_OTHER): Payer: Medicare Other | Admitting: Family Medicine

## 2022-11-11 ENCOUNTER — Encounter: Payer: Self-pay | Admitting: Family Medicine

## 2022-11-11 VITALS — BP 130/70 | HR 80 | Temp 97.8°F | Ht 74.5 in | Wt 246.3 lb

## 2022-11-11 DIAGNOSIS — R7303 Prediabetes: Secondary | ICD-10-CM | POA: Diagnosis not present

## 2022-11-11 DIAGNOSIS — E785 Hyperlipidemia, unspecified: Secondary | ICD-10-CM | POA: Diagnosis not present

## 2022-11-11 DIAGNOSIS — I10 Essential (primary) hypertension: Secondary | ICD-10-CM | POA: Diagnosis not present

## 2022-11-11 DIAGNOSIS — Z125 Encounter for screening for malignant neoplasm of prostate: Secondary | ICD-10-CM

## 2022-11-11 DIAGNOSIS — J069 Acute upper respiratory infection, unspecified: Secondary | ICD-10-CM | POA: Diagnosis not present

## 2022-11-11 MED ORDER — AMOXICILLIN-POT CLAVULANATE 875-125 MG PO TABS: 1.0000 | ORAL_TABLET | Freq: Two times a day (BID) | ORAL | 0 refills | Status: DC

## 2022-11-11 NOTE — Progress Notes (Signed)
Established Patient Office Visit  Subjective   Patient ID: Bentzion Dauria, male    DOB: August 31, 1946  Age: 77 y.o. MRN: [id number]  Chief Complaint  Patient presents with   Cough    Patient complains of cough, x9 days, Productive cough, Tried Advil, Vitamin C and Mucinex with little relief   Nasal Congestion    Patient complains of nasal congestion, x9 days, Tried Vitamin C and Mucinex with little relief    HPI   Gene is here with about 9-day history of respiratory symptoms.  He had some nasal congestion and cough.  He states he has some purulent nasal discharge initially and some headaches but is slowly feeling some better.  Symptoms tend to be worse early in the morning.  No associated fever.  No chills.  No nausea or vomiting.  No known sick contacts.  He has chronic problems including hypertension, GERD, hyperlipidemia ,prediabetes.  Due for follow-up labs.  He remains on Vytorin for hyperlipidemia and takes HCTZ and lisinopril for hypertension.  No recent chest pains.  Past Medical History:  Diagnosis Date   Arthritis    Hyperlipidemia    Hypertension    Past Surgical History:  Procedure Laterality Date   ORIF Rt femur Right 01/05/2014   TONSILLECTOMY     TOOTH EXTRACTION     3 teeth   TOTAL KNEE ARTHROPLASTY     VARICOSE VEIN SURGERY      reports that he has quit smoking. He has never used smokeless tobacco. He reports current alcohol use of about 7.0 standard drinks of alcohol per week. No history on file for drug use. family history includes Arthritis in his father; Cancer in his mother; Heart disease (age of onset: 79) in his father; Hypertension in his father. No Known Allergies  Review of Systems  Constitutional:  Negative for chills, fever and malaise/fatigue.  HENT:  Positive for congestion. Negative for ear pain.   Eyes:  Negative for blurred vision.  Respiratory:  Positive for cough. Negative for shortness of breath.   Cardiovascular:  Negative for chest  pain.  Neurological:  Negative for dizziness, weakness and headaches.      Objective:     BP 130/70 (BP Location: Left Arm, Patient Position: Sitting, Cuff Size: Normal)   Pulse 80   Temp 97.8 F (36.6 C) (Oral)   Ht 6' 2.5" (1.892 m)   Wt 246 lb 4.8 oz (111.7 kg)   SpO2 96%   BMI 31.20 kg/m  BP Readings from Last 3 Encounters:  11/11/22 130/70  04/25/22 130/62  02/01/22 126/64   Wt Readings from Last 3 Encounters:  11/11/22 246 lb 4.8 oz (111.7 kg)  04/25/22 245 lb 6.4 oz (111.3 kg)  02/01/22 248 lb 6.4 oz (112.7 kg)      Physical Exam Vitals reviewed.  Constitutional:      General: He is not in acute distress.    Appearance: Normal appearance. He is not ill-appearing.  HENT:     Right Ear: Tympanic membrane normal.     Left Ear: Tympanic membrane normal.  Cardiovascular:     Rate and Rhythm: Normal rate and regular rhythm.  Pulmonary:     Effort: Pulmonary effort is normal.     Breath sounds: Normal breath sounds. No wheezing or rales.  Musculoskeletal:     Cervical back: Neck supple.     Right lower leg: No edema.     Left lower leg: No edema.  Lymphadenopathy:  Cervical: No cervical adenopathy.  Neurological:     Mental Status: He is alert.      No results found for any visits on 11/11/22.    The 10-year ASCVD risk score (Arnett DK, et al., 2019) is: 29.3%    Assessment & Plan:   #1 probable viral URI with cough.  Nonfocal exam.  Slowly improving.  Recommend plenty of fluids and observe for now.  Follow-up promptly for any fever or worsening symptoms.  Continue over-the-counter Mucinex  #2 history of prediabetes range blood sugars.  Recheck A1c.  Continue low glycemic diet  #3 hypertension stable.  Check basic metabolic panel.  Continue lisinopril and HCTZ  #4 hyperlipidemia treated with Vytorin.  Recheck lipid and hepatic panel  #5 health maintenance-patient requesting PSA.  We discussed [place] preventive task force guidelines  which do not recommend screening after 70.  Nevertheless, he would like to go ahead and screen     No follow-ups on file.    Carolann Littler, MD

## 2022-11-17 ENCOUNTER — Other Ambulatory Visit: Payer: Medicare Other

## 2022-11-24 ENCOUNTER — Other Ambulatory Visit (INDEPENDENT_AMBULATORY_CARE_PROVIDER_SITE_OTHER): Payer: Medicare Other

## 2022-11-24 DIAGNOSIS — I10 Essential (primary) hypertension: Secondary | ICD-10-CM | POA: Diagnosis not present

## 2022-11-24 DIAGNOSIS — E785 Hyperlipidemia, unspecified: Secondary | ICD-10-CM | POA: Diagnosis not present

## 2022-11-24 DIAGNOSIS — R7303 Prediabetes: Secondary | ICD-10-CM

## 2022-11-24 DIAGNOSIS — Z125 Encounter for screening for malignant neoplasm of prostate: Secondary | ICD-10-CM

## 2022-11-24 LAB — LIPID PANEL
Cholesterol: 138 mg/dL (ref 0–200)
HDL: 54.1 mg/dL (ref >39.00)
LDL Cholesterol: 61 mg/dL (ref 0–99)
NonHDL: 84.33
Total CHOL/HDL Ratio: 3
Triglycerides: 118 mg/dL (ref 0.0–149.0)
VLDL: 23.6 mg/dL (ref 0.0–40.0)

## 2022-11-24 LAB — BASIC METABOLIC PANEL
BUN: 12 mg/dL (ref 6–23)
CO2: 28 mEq/L (ref 19–32)
Calcium: 9.3 mg/dL (ref 8.4–10.5)
Chloride: 98 mEq/L (ref 96–112)
Creatinine, Ser: 0.96 mg/dL (ref 0.40–1.50)
GFR: 76.2 mL/min (ref >60.00)
Glucose, Bld: 137 mg/dL — ABNORMAL HIGH (ref 70–99)
Potassium: 4.1 mEq/L (ref 3.5–5.1)
Sodium: 135 mEq/L (ref 135–145)

## 2022-11-24 LAB — HEPATIC FUNCTION PANEL
ALT: 14 U/L (ref 0–53)
AST: 16 U/L (ref 0–37)
Albumin: 4.2 g/dL (ref 3.5–5.2)
Alkaline Phosphatase: 46 U/L (ref 39–117)
Bilirubin, Direct: 0.3 mg/dL (ref 0.0–0.3)
Total Bilirubin: 1.6 mg/dL — ABNORMAL HIGH (ref 0.2–1.2)
Total Protein: 6.7 g/dL (ref 6.0–8.3)

## 2022-11-24 LAB — HEMOGLOBIN A1C: Hgb A1c MFr Bld: 6.7 % — ABNORMAL HIGH (ref 4.6–6.5)

## 2022-11-24 LAB — PSA, MEDICARE: PSA: 3.42 ng/ml (ref 0.10–4.00)

## 2022-12-23 ENCOUNTER — Encounter: Payer: Self-pay | Admitting: Family Medicine

## 2022-12-23 ENCOUNTER — Ambulatory Visit (INDEPENDENT_AMBULATORY_CARE_PROVIDER_SITE_OTHER): Payer: Medicare Other | Admitting: Family Medicine

## 2022-12-23 VITALS — BP 120/60 | HR 85 | Temp 97.8°F | Wt 245.5 lb

## 2022-12-23 DIAGNOSIS — I10 Essential (primary) hypertension: Secondary | ICD-10-CM

## 2022-12-23 DIAGNOSIS — R739 Hyperglycemia, unspecified: Secondary | ICD-10-CM

## 2022-12-23 DIAGNOSIS — I7121 Aneurysm of the ascending aorta, without rupture: Secondary | ICD-10-CM

## 2022-12-23 NOTE — Patient Instructions (Signed)
Set up for repeat labs for PSA and A1C in about 3 months.

## 2022-12-23 NOTE — Progress Notes (Signed)
Established Patient Office Visit  Subjective   Patient ID: Paul Townsend, male    DOB: 09-12-1946  Age: 77 y.o. MRN: [id number]  Chief Complaint  Patient presents with   Urinary Frequency    HPI   Mr Pine is here for medical follow-up.  His hypertension, ascending aortic aneurysm, GERD, osteoarthritis, type 2 diabetes by recent labs.  He had some increase in PSA by recent labs and recommended 70-monthfollow-up.  He gets up about 3-4 times at night to urinate but no slow stream.  No obstructive symptoms.  Does drink quite a bit of fluids late in the day.  Has recently scaled back alcohol.  Tries to avoid caffeine after 2 to 3 PM.  Most recent PSA was 3.42 which is up from 2.57 a year ago.  We had recommended 457-monthollow-up.  He remains on HCTZ and lisinopril for hypertension.  Blood pressure well-controlled.  History of a ascending thoracic aortic aneurysm with most recent measurement back in 2022 which was 4.3 cm.  Needs annual follow-up.  No recent chest pains.  Walking for exercise with no difficulty.  Past Medical History:  Diagnosis Date   Arthritis    Hyperlipidemia    Hypertension    Past Surgical History:  Procedure Laterality Date   ORIF Rt femur Right 01/05/2014   TONSILLECTOMY     TOOTH EXTRACTION     3 teeth   TOTAL KNEE ARTHROPLASTY     VARICOSE VEIN SURGERY      reports that he has quit smoking. He has never used smokeless tobacco. He reports current alcohol use of about 7.0 standard drinks of alcohol per week. No history on file for drug use. family history includes Arthritis in his father; Cancer in his mother; Heart disease (age of onset: 8554in his father; Hypertension in his father. No Known Allergies  Review of Systems  Constitutional:  Negative for malaise/fatigue.  Eyes:  Negative for blurred vision.  Respiratory:  Negative for shortness of breath.   Cardiovascular:  Negative for chest pain.  Gastrointestinal:  Negative for abdominal pain.   Neurological:  Negative for dizziness, weakness and headaches.      Objective:     BP 120/60 (BP Location: Left Arm, Patient Position: Sitting, Cuff Size: Normal)   Pulse 85   Temp 97.8 F (36.6 C) (Oral)   Wt 245 lb 8 oz (111.4 kg)   SpO2 97%   BMI 31.10 kg/m  BP Readings from Last 3 Encounters:  12/23/22 120/60  11/11/22 130/70  04/25/22 130/62   Wt Readings from Last 3 Encounters:  12/23/22 245 lb 8 oz (111.4 kg)  11/11/22 246 lb 4.8 oz (111.7 kg)  04/25/22 245 lb 6.4 oz (111.3 kg)      Physical Exam Vitals reviewed.  Constitutional:      Appearance: He is well-developed.  HENT:     Right Ear: External ear normal.     Left Ear: External ear normal.  Eyes:     Pupils: Pupils are equal, round, and reactive to light.  Neck:     Thyroid: No thyromegaly.  Cardiovascular:     Rate and Rhythm: Normal rate and regular rhythm.  Pulmonary:     Effort: Pulmonary effort is normal. No respiratory distress.     Breath sounds: Normal breath sounds. No wheezing or rales.  Genitourinary:    Comments: Normal sphincter tone.  Rectal exam reveals no rectal mass.  Prostate is symmetric with no palpable nodules.  Prostate nontender.   Prostate only minimally enlarged. Musculoskeletal:     Cervical back: Neck supple.  Neurological:     Mental Status: He is alert and oriented to person, place, and time.      No results found for any visits on 12/23/22.    The 10-year ASCVD risk score (Arnett DK, et al., 2019) is: 25.9%    Assessment & Plan:   #1 increased PSA velocity.  Recent PSA 3.42.  This was up from a year ago.  Prostate exam unremarkable.  Future lab placed for repeat PSA in about 3 months.  If increasing at that time consider referral to urology  #2 hypertension stable and well-controlled.  Continue lisinopril and HCTZ  #3 history of ascending thoracic aortic aneurysm.  Last imaged 2022.  Needs follow-up.  Set up CT angiogram chest for further evaluation.  This  was previously 4.3 cm near the aortic root  #4 type 2 diabetes with a recent A1c of 6.7%.  Continue regular exercise and low glycemic diet.  Added A1c to future labs for about 3 months.  If climbing at that point consider medication options   No follow-ups on file.    Carolann Littler, MD

## 2023-01-07 ENCOUNTER — Ambulatory Visit (HOSPITAL_BASED_OUTPATIENT_CLINIC_OR_DEPARTMENT_OTHER)
Admission: RE | Admit: 2023-01-07 | Discharge: 2023-01-07 | Disposition: A | Discharge status: Home / Self Care | Payer: Medicare Other | Source: Ambulatory Visit | Attending: Family Medicine | Admitting: Family Medicine

## 2023-01-07 DIAGNOSIS — I7121 Aneurysm of the ascending aorta, without rupture: Secondary | ICD-10-CM | POA: Diagnosis not present

## 2023-01-07 DIAGNOSIS — I712 Thoracic aortic aneurysm, without rupture, unspecified: Secondary | ICD-10-CM | POA: Diagnosis not present

## 2023-01-07 MED ORDER — IOHEXOL 350 MG/ML SOLN
100.0000 mL | Freq: Once | INTRAVENOUS | Status: AC | PRN
  Administered 2023-01-07: 75 mL via INTRAVENOUS

## 2023-01-27 ENCOUNTER — Ambulatory Visit (INDEPENDENT_AMBULATORY_CARE_PROVIDER_SITE_OTHER): Payer: Medicare Other | Admitting: Adult Health

## 2023-01-27 ENCOUNTER — Encounter: Payer: Self-pay | Admitting: Adult Health

## 2023-01-27 VITALS — BP 120/62 | HR 70 | Temp 97.8°F | Ht 74.5 in | Wt 242.0 lb

## 2023-01-27 DIAGNOSIS — R0981 Nasal congestion: Secondary | ICD-10-CM

## 2023-01-27 MED ORDER — DOXYCYCLINE HYCLATE 100 MG PO CAPS: 100.0000 mg | ORAL_CAPSULE | Freq: Two times a day (BID) | ORAL | 0 refills | Status: DC

## 2023-01-27 NOTE — Progress Notes (Signed)
Subjective:    Patient ID: Paul Townsend, male    DOB: 12/10/1945, 77 y.o.   MRN: [id number]  Sinus Problem The current episode started 1 to 4 weeks ago (two weeks). There has been no fever. Associated symptoms include congestion and coughing. Pertinent negatives include no chills, ear pain, headaches, shortness of breath, sinus pressure, sneezing, sore throat or swollen glands. Past treatments include oral decongestants, saline nose sprays and nasal decongestants (Claritin, FLonase, Afrin, saline solution and Astepro). The treatment provided mild relief.   77 year old male who  has a past medical history of Arthritis, Hyperlipidemia, and Hypertension.  Review of Systems  Constitutional:  Negative for chills.  HENT:  Positive for congestion. Negative for ear pain, sinus pressure, sneezing and sore throat.   Respiratory:  Positive for cough. Negative for shortness of breath.   Neurological:  Negative for headaches.    Past Medical History:  Diagnosis Date   Arthritis    Hyperlipidemia    Hypertension     Social History   Socioeconomic History   Marital status: Married    Spouse name: Not on file   Number of children: Not on file   Years of education: Not on file   Highest education level: Not on file  Occupational History   Not on file  Tobacco Use   Smoking status: Former   Smokeless tobacco: Never   Tobacco comments:    Did not review today due to time limits   Vaping Use   Vaping Use: Never used  Substance and Sexual Activity   Alcohol use: Yes    Alcohol/week: 7.0 standard drinks of alcohol    Types: 7 Glasses of wine per week    Comment: wine   Drug use: Not on file   Sexual activity: Not on file  Other Topics Concern   Not on file  Social History Narrative   Not on file   Social Determinants of Health   Financial Resource Strain: Low Risk  (02/01/2022)   Overall Financial Resource Strain (CARDIA)    Difficulty of Paying Living Expenses: Not hard at  all  Food Insecurity: No Food Insecurity (02/01/2022)   Hunger Vital Sign    Worried About Running Out of Food in the Last Year: Never true    Ran Out of Food in the Last Year: Never true  Transportation Needs: No Transportation Needs (02/01/2022)   PRAPARE - Hydrologist (Medical): No    Lack of Transportation (Non-Medical): No  Physical Activity: Sufficiently Active (02/01/2022)   Exercise Vital Sign    Days of Exercise per Week: 6 days    Minutes of Exercise per Session: 50 min  Stress: No Stress Concern Present (02/01/2022)   [place]    Feeling of Stress : Not at all  Social Connections: Not on file  Intimate Partner Violence: Not on file    Past Surgical History:  Procedure Laterality Date   ORIF Rt femur Right 01/05/2014   TONSILLECTOMY     TOOTH EXTRACTION     3 teeth   TOTAL KNEE ARTHROPLASTY     VARICOSE VEIN SURGERY      Family History  Problem Relation Age of Onset   Cancer Mother        colon   Arthritis Father    Hypertension Father    Heart disease Father 38       MI  No Known Allergies  Current Outpatient Medications on File Prior to Visit  Medication Sig Dispense Refill   Ascorbic Acid (VITAMIN C PO) Take by mouth.     aspirin 81 MG chewable tablet Chew 81 mg by mouth.     Calcium Citrate-Vitamin D (CALCIUM + D PO) Take by mouth.     ezetimibe-simvastatin (VYTORIN) 10-20 MG tablet TAKE 1 TABLET AT BEDTIME 90 tablet 1   hydrochlorothiazide (MICROZIDE) 12.5 MG capsule TAKE 1 CAPSULE DAILY 90 capsule 1   lisinopril (ZESTRIL) 10 MG tablet TAKE 1 TABLET DAILY 90 tablet 3   Misc Natural Products (TART CHERRY ADVANCED PO) Take 1 capsule by mouth daily.     omeprazole (PRILOSEC OTC) 20 MG tablet Take by mouth.     Potassium 99 MG TABS Take 1 tablet by mouth daily.     Prasterone, DHEA, (DHEA 50 PO) Take by mouth.     vitamin B-12 (CYANOCOBALAMIN) 1000 MCG tablet  Take 1,000 mcg by mouth daily.     Zinc Acetate, Oral, (ZINC ACETATE PO) Take by mouth.     No current facility-administered medications on file prior to visit.    BP 120/62   Pulse 70   Temp 97.8 F (36.6 C) (Oral)   Ht 6' 2.5" (1.892 m)   Wt 242 lb (109.8 kg)   SpO2 96%   BMI 30.66 kg/m       Objective:   Physical Exam Vitals and nursing note reviewed.  Constitutional:      Appearance: Normal appearance.  HENT:     Nose: Congestion present. No rhinorrhea.     Right Turbinates: Enlarged and swollen.     Right Sinus: No maxillary sinus tenderness or frontal sinus tenderness.     Left Sinus: No maxillary sinus tenderness or frontal sinus tenderness.  Cardiovascular:     Rate and Rhythm: Normal rate and regular rhythm.     Pulses: Normal pulses.     Heart sounds: Normal heart sounds.  Pulmonary:     Effort: Pulmonary effort is normal.     Breath sounds: Normal breath sounds.  Musculoskeletal:        General: Normal range of motion.  Skin:    General: Skin is warm and dry.     Capillary Refill: Capillary refill takes less than 2 seconds.  Neurological:     General: No focal deficit present.     Mental Status: He is alert and oriented to person, place, and time.  Psychiatric:        Mood and Affect: Mood normal.        Behavior: Behavior normal.        Thought Content: Thought content normal.       Assessment & Plan:  1. Nasal congestion - Improving slightly over the last 24 hours. Will have him continue with Astepro over thre next 2-3 days, If not improving further can start abx.  - Follow up as needed - doxycycline (VIBRAMYCIN) 100 MG capsule; Take 1 capsule (100 mg total) by mouth 2 (two) times daily.  Dispense: 14 capsule; Refill: 0   Dorothyann Peng, NP

## 2023-02-01 ENCOUNTER — Telehealth: Payer: Self-pay | Admitting: Family Medicine

## 2023-02-01 NOTE — Telephone Encounter (Signed)
Contacted Paul Townsend to schedule their annual wellness visit. Appointment made for 02/10/23.  Paul Townsend AW[facility] direct phone # [phone]   Patient aware of 4/2 appt r/s to 4/5

## 2023-02-07 ENCOUNTER — Ambulatory Visit: Payer: Medicare Other

## 2023-02-10 ENCOUNTER — Ambulatory Visit (INDEPENDENT_AMBULATORY_CARE_PROVIDER_SITE_OTHER): Payer: Medicare Other

## 2023-02-10 VITALS — Ht 74.5 in | Wt 239.0 lb

## 2023-02-10 DIAGNOSIS — Z0000 Encounter for general adult medical examination without abnormal findings: Secondary | ICD-10-CM | POA: Diagnosis not present

## 2023-02-10 NOTE — Patient Instructions (Addendum)
[name] , Thank you for taking time to come for your Medicare Wellness Visit. I appreciate your ongoing commitment to your health goals. Please review the following plan we discussed and let me know if I can assist you in the future.   These are the goals we discussed:  Goals       Patient Stated      02/01/2022, lose some weight      Patient stated (pt-stated)      Complete book projects.      Weight (lb) < 239 lb (108.4 kg)      Ongoing weight loss by cutting back calories and drinking water         This is a list of the screening recommended for you and due dates:  Health Maintenance  Topic Date Due   DTaP/Tdap/Td vaccine (2 - Td or Tdap) 04/16/2022   COVID-19 Vaccine (7 - 2023-24 season) 02/26/2023*   Flu Shot  06/08/2023   Medicare Annual Wellness Visit  02/10/2024   Pneumonia Vaccine  Completed   Hepatitis C Screening: USPSTF Recommendation to screen - Ages 18-79 yo.  Completed   Zoster (Shingles) Vaccine  Completed   HPV Vaccine  Aged Out   Colon Cancer Screening  Discontinued  *Topic was postponed. The date shown is not the original due date.    Advanced directives: Please bring a copy of your health care power of attorney and living will to the office to be added to your chart at your convenience.   Conditions/risks identified: None  Next appointment: Follow up in one year for your annual wellness visit.   Preventive Care 62 Years and Older, Male  Preventive care refers to lifestyle choices and visits with your health care provider that can promote health and wellness. What does preventive care include? A yearly physical exam. This is also called an annual well check. Dental exams once or twice a year. Routine eye exams. Ask your health care provider how often you should have your eyes checked. Personal lifestyle choices, including: Daily care of your teeth and gums. Regular physical activity. Eating a healthy diet. Avoiding tobacco and drug use. Limiting  alcohol use. Practicing safe sex. Taking low doses of aspirin every day. Taking vitamin and mineral supplements as recommended by your health care provider. What happens during an annual well check? The services and screenings done by your health care provider during your annual well check will depend on your age, overall health, lifestyle risk factors, and family history of disease. Counseling  Your health care provider may ask you questions about your: Alcohol use. Tobacco use. Drug use. Emotional well-being. Home and relationship well-being. Sexual activity. Eating habits. History of falls. Memory and ability to understand (cognition). Work and work Astronomer. Screening  You may have the following tests or measurements: Height, weight, and BMI. Blood pressure. Lipid and cholesterol levels. These may be checked every 5 years, or more frequently if you are over 66 years old. Skin check. Lung cancer screening. You may have this screening every year starting at age 75 if you have a 30-pack-year history of smoking and currently smoke or have quit within the past 15 years. Fecal occult blood test (FOBT) of the stool. You may have this test every year starting at age 14. Flexible sigmoidoscopy or colonoscopy. You may have a sigmoidoscopy every 5 years or a colonoscopy every 10 years starting at age 68. Prostate cancer screening. Recommendations will vary depending on your family history and other  risks. Hepatitis C blood test. Hepatitis B blood test. Sexually transmitted disease (STD) testing. Diabetes screening. This is done by checking your blood sugar (glucose) after you have not eaten for a while (fasting). You may have this done every 1-3 years. Abdominal aortic aneurysm (AAA) screening. You may need this if you are a current or former smoker. Osteoporosis. You may be screened starting at age 32 if you are at high risk. Talk with your health care provider about your test results,  treatment options, and if necessary, the need for more tests. Vaccines  Your health care provider may recommend certain vaccines, such as: Influenza vaccine. This is recommended every year. Tetanus, diphtheria, and acellular pertussis (Tdap, Td) vaccine. You may need a Td booster every 10 years. Zoster vaccine. You may need this after age 61. Pneumococcal 13-valent conjugate (PCV13) vaccine. One dose is recommended after age 14. Pneumococcal polysaccharide (PPSV23) vaccine. One dose is recommended after age 98. Talk to your health care provider about which screenings and vaccines you need and how often you need them. This information is not intended to replace advice given to you by your health care provider. Make sure you discuss any questions you have with your health care provider. Document Released: 11/20/2015 Document Revised: 07/13/2016 Document Reviewed: 08/25/2015 Elsevier Interactive Patient Education  2017 [place] Prevention in the Home Falls can cause injuries. They can happen to people of all ages. There are many things you can do to make your home safe and to help prevent falls. What can I do on the outside of my home? Regularly fix the edges of walkways and driveways and fix any cracks. Remove anything that might make you trip as you walk through a door, such as a raised step or threshold. Trim any bushes or trees on the path to your home. Use bright outdoor lighting. Clear any walking paths of anything that might make someone trip, such as rocks or tools. Regularly check to see if handrails are loose or broken. Make sure that both sides of any steps have handrails. Any raised decks and porches should have guardrails on the edges. Have any leaves, snow, or ice cleared regularly. Use sand or salt on walking paths during winter. Clean up any spills in your garage right away. This includes oil or grease spills. What can I do in the bathroom? Use night  lights. Install grab bars by the toilet and in the tub and shower. Do not use towel bars as grab bars. Use non-skid mats or decals in the tub or shower. If you need to sit down in the shower, use a plastic, non-slip stool. Keep the floor dry. Clean up any water that spills on the floor as soon as it happens. Remove soap buildup in the tub or shower regularly. Attach bath mats securely with double-sided non-slip rug tape. Do not have throw rugs and other things on the floor that can make you trip. What can I do in the bedroom? Use night lights. Make sure that you have a light by your bed that is easy to reach. Do not use any sheets or blankets that are too big for your bed. They should not hang down onto the floor. Have a firm chair that has side arms. You can use this for support while you get dressed. Do not have throw rugs and other things on the floor that can make you trip. What can I do in the kitchen? Clean up any spills right away.  Avoid walking on wet floors. Keep items that you use a lot in easy-to-reach places. If you need to reach something above you, use a strong step stool that has a grab bar. Keep electrical cords out of the way. Do not use floor polish or wax that makes floors slippery. If you must use wax, use non-skid floor wax. Do not have throw rugs and other things on the floor that can make you trip. What can I do with my stairs? Do not leave any items on the stairs. Make sure that there are handrails on both sides of the stairs and use them. Fix handrails that are broken or loose. Make sure that handrails are as long as the stairways. Check any carpeting to make sure that it is firmly attached to the stairs. Fix any carpet that is loose or worn. Avoid having throw rugs at the top or bottom of the stairs. If you do have throw rugs, attach them to the floor with carpet tape. Make sure that you have a light switch at the top of the stairs and the bottom of the stairs. If  you do not have them, ask someone to add them for you. What else can I do to help prevent falls? Wear shoes that: Do not have high heels. Have rubber bottoms. Are comfortable and fit you well. Are closed at the toe. Do not wear sandals. If you use a stepladder: Make sure that it is fully opened. Do not climb a closed stepladder. Make sure that both sides of the stepladder are locked into place. Ask someone to hold it for you, if possible. Clearly mark and make sure that you can see: Any grab bars or handrails. First and last steps. Where the edge of each step is. Use tools that help you move around (mobility aids) if they are needed. These include: Canes. Walkers. Scooters. Crutches. Turn on the lights when you go into a dark area. Replace any light bulbs as soon as they burn out. Set up your furniture so you have a clear path. Avoid moving your furniture around. If any of your floors are uneven, fix them. If there are any pets around you, be aware of where they are. Review your medicines with your doctor. Some medicines can make you feel dizzy. This can increase your chance of falling. Ask your doctor what other things that you can do to help prevent falls. This information is not intended to replace advice given to you by your health care provider. Make sure you discuss any questions you have with your health care provider. Document Released: 08/20/2009 Document Revised: 03/31/2016 Document Reviewed: 11/28/2014 Elsevier Interactive Patient Education  2017 [employer].

## 2023-02-10 NOTE — Progress Notes (Signed)
Subjective:   Paul Townsend is a 77 y.o. male who presents for Medicare Annual/Subsequent preventive examination.  Review of Systems    Virtual Visit via Telephone Note  I connected with  Conception Oms on 02/10/23 at  3:45 PM EDT by telephone and verified that I am speaking with the correct person using two identifiers.  Location: Patient: Home Provider: Office Persons participating in the virtual visit: patient/Nurse Health Advisor   I discussed the limitations, risks, security and privacy concerns of performing an evaluation and management service by telephone and the availability of in person appointments. The patient expressed understanding and agreed to proceed.  Interactive audio and video telecommunications were attempted between this nurse and patient, however failed, due to patient having technical difficulties OR patient did not have access to video capability.  We continued and completed visit with audio only.  Some vital signs may be absent or patient reported.   Tillie Rung, LPN  Cardiac Risk Factors include: advanced age (>29men, >29 women);hypertension;male gender     Objective:    Today's Vitals   02/10/23 1550  Weight: 239 lb (108.4 kg)  Height: 6' 2.5" (1.892 m)   Body mass index is 30.28 kg/m.     02/10/2023    4:00 PM 02/01/2022    3:54 PM 09/25/2018   10:21 AM 10/20/2017    9:53 AM  Advanced Directives  Does Patient Have a Medical Advance Directive? Yes Yes Yes Yes  Type of Estate agent of [place];Living will Healthcare Power of [place];Living will Healthcare Power of [place];Living will   Copy of Healthcare Power of Attorney in Chart? No - copy requested No - copy requested    Would patient like information on creating a medical advance directive?   No - Patient declined     Current Medications (verified) Outpatient Encounter Medications as of 02/10/2023  Medication Sig   Ascorbic Acid (VITAMIN C PO) Take  by mouth.   aspirin 81 MG chewable tablet Chew 81 mg by mouth.   Calcium Citrate-Vitamin D (CALCIUM + D PO) Take by mouth.   doxycycline (VIBRAMYCIN) 100 MG capsule Take 1 capsule (100 mg total) by mouth 2 (two) times daily.   ezetimibe-simvastatin (VYTORIN) 10-20 MG tablet TAKE 1 TABLET AT BEDTIME   hydrochlorothiazide (MICROZIDE) 12.5 MG capsule TAKE 1 CAPSULE DAILY   lisinopril (ZESTRIL) 10 MG tablet TAKE 1 TABLET DAILY   Misc Natural Products (TART CHERRY ADVANCED PO) Take 1 capsule by mouth daily.   omeprazole (PRILOSEC OTC) 20 MG tablet Take by mouth.   Potassium 99 MG TABS Take 1 tablet by mouth daily.   Prasterone, DHEA, (DHEA 50 PO) Take by mouth.   vitamin B-12 (CYANOCOBALAMIN) 1000 MCG tablet Take 1,000 mcg by mouth daily.   Zinc Acetate, Oral, (ZINC ACETATE PO) Take by mouth.   No facility-administered encounter medications on file as of 02/10/2023.    Allergies (verified) Patient has no known allergies.   History: Past Medical History:  Diagnosis Date   Arthritis    Hyperlipidemia    Hypertension    Past Surgical History:  Procedure Laterality Date   ORIF Rt femur Right 01/05/2014   TONSILLECTOMY     TOOTH EXTRACTION     3 teeth   TOTAL KNEE ARTHROPLASTY     VARICOSE VEIN SURGERY     Family History  Problem Relation Age of Onset   Cancer Mother        colon   Arthritis Father  Hypertension Father    Heart disease Father [id number]       MI   Social History   Socioeconomic History   Marital status: Married    Spouse name: Not on file   Number of children: Not on file   Years of education: Not on file   Highest education level: Not on file  Occupational History   Not on file  Tobacco Use   Smoking status: Former   Smokeless tobacco: Never   Tobacco comments:    Did not review today due to time limits   Vaping Use   Vaping Use: Never used  Substance and Sexual Activity   Alcohol use: Yes    Alcohol/week: 7.0 standard drinks of alcohol    Types: 7  Glasses of wine per week    Comment: wine   Drug use: Not on file   Sexual activity: Not on file  Other Topics Concern   Not on file  Social History Narrative   Not on file   Social Determinants of Health   Financial Resource Strain: Low Risk  (02/10/2023)   Overall Financial Resource Strain (CARDIA)    Difficulty of Paying Living Expenses: Not hard at all  Food Insecurity: No Food Insecurity (02/10/2023)   Hunger Vital Sign    Worried About Running Out of Food in the Last Year: Never true    Ran Out of Food in the Last Year: Never true  Transportation Needs: No Transportation Needs (02/10/2023)   PRAPARE - Administrator, Civil ServiceTransportation    Lack of Transportation (Medical): No    Lack of Transportation (Non-Medical): No  Physical Activity: Sufficiently Active (02/10/2023)   Exercise Vital Sign    Days of Exercise per Week: 5 days    Minutes of Exercise per Session: 50 min  Stress: No Stress Concern Present (02/10/2023)   [employer] - Occupational Stress Questionnaire    Feeling of Stress : Not at all  Social Connections: Socially Integrated (02/10/2023)   Social Connection and Isolation Panel [NHANES]    Frequency of Communication with Friends and Family: More than three times a week    Frequency of Social Gatherings with Friends and Family: More than three times a week    Attends Religious Services: More than 4 times per year    Active Member of [employer]ubs or Organizations: Yes    Attends Engineer, structuralClub or Organization Meetings: More than 4 times per year    Marital Status: Married    Tobacco Counseling Counseling given: Not Answered Tobacco comments: Did not review today due to time limits    Clinical Intake:  Pre-visit preparation completed: No  Pain : No/denies pain     BMI - recorded: 30.28 Nutritional Status: BMI > 30  Obese Nutritional Risks: None Diabetes: No  How often do you need to have someone help you when you read instructions, pamphlets, or other written  materials from your doctor or pharmacy?: 1 - Never  Diabetic?  No  Interpreter Needed?: No  Information entered by :: Theresa MulliganBeverly Tylee Newby LPN   Activities of Daily Living    02/10/2023    3:57 PM  In your present state of health, do you have any difficulty performing the following activities:  Hearing? 0  Vision? 0  Difficulty concentrating or making decisions? 0  Walking or climbing stairs? 0  Dressing or bathing? 0  Doing errands, shopping? 0  Preparing Food and eating ? N  Using the Toilet? N  In the past six  months, have you accidently leaked urine? N  Do you have problems with loss of bowel control? N  Managing your Medications? N  Managing your Finances? N  Housekeeping or managing your Housekeeping? N    Patient Care Team: Kristian Covey, MD as PCP - General Wendall Stade, MD as PCP - Cardiology (Cardiology)  Indicate any recent Medical Services you may have received from other than [facility] providers in the past year (date may be approximate).     Assessment:   This is a routine wellness examination for Tadeo.  Hearing/Vision screen Hearing Screening - Comments:: Denies hearing difficulties   Vision Screening - Comments:: Wears reading  glasses - up to date with routine eye exams with  Patient deferred  Dietary issues and exercise activities discussed: Exercise limited by: None identified   Goals Addressed               This Visit's Progress     Patient stated (pt-stated)        Complete book projects.       Depression Screen    02/10/2023    4:06 PM 11/11/2022   10:03 AM 02/01/2022    3:56 PM 12/01/2021    9:17 AM 10/08/2021    8:00 AM 11/26/2019    7:54 AM 10/19/2018    8:52 AM  PHQ 2/9 Scores  PHQ - 2 Score 0 0 0 0 0 1 2  PHQ- 9 Score      4 5    Fall Risk    02/10/2023    3:57 PM 11/11/2022   10:03 AM 02/01/2022    3:55 PM 12/01/2021    9:18 AM 10/08/2021    8:00 AM  Fall Risk   Falls in the past year? 0 0 0 0 0  Number falls in past yr: 0  0   0  Injury with Fall? 0 0   0  Risk for fall due to : No Fall Risks No Fall Risks Medication side effect  No Fall Risks  Follow up Falls prevention discussed Falls evaluation completed Falls evaluation completed;Education provided;Falls prevention discussed  Falls evaluation completed    FALL RISK PREVENTION PERTAINING TO THE HOME:  Any stairs in or around the home? No If so, are there any without handrails? No  Home free of loose throw rugs in walkways, pet beds, electrical cords, etc? Yes  Adequate lighting in your home to reduce risk of falls? Yes   ASSISTIVE DEVICES UTILIZED TO PREVENT FALLS:  Life alert? No  Use of a cane, walker or w/c? No  Grab bars in the bathroom? Yes  Shower chair or bench in shower? Yes  Elevated toilet seat or a handicapped toilet? Yes   TIMED UP AND GO:  Was the test performed? No . Audio Visit   Cognitive Function:        02/10/2023    4:00 PM 02/01/2022    3:59 PM  6CIT Screen  What Year? 0 points 0 points  What month? 0 points 0 points  What time? 0 points 0 points  Count back from 20 0 points 0 points  Months in reverse 0 points 0 points  Repeat phrase 0 points 0 points  Total Score 0 points 0 points    Immunizations Immunization History  Administered Date(s) Administered   Fluad Quad(high Dose 65+) 07/16/2019, 08/13/2020   Hepatitis A 04/16/2012   Hepatitis A, Adult 05/20/2016   Influenza Whole 08/23/2010   Influenza, High Dose  Seasonal PF 10/29/2014, 09/18/2015, 10/18/2016, 08/18/2022   Influenza,inj,Quad PF,6+ Mos 08/19/2013, 07/30/2018   Influenza-Unspecified 09/18/2015, 07/11/2017, 09/23/2021   Moderna Covid-19 Vaccine Bivalent Booster 7281yrs & up 08/18/2022   Moderna Sars-Covid-2 Vaccination 12/20/2019, 01/17/2020, 09/12/2020, 04/27/2021   PFIZER Comirnaty(Gray Top)Covid-19 Tri-Sucrose Vaccine 09/23/2021   Pneumococcal Conjugate-13 05/27/2015   Pneumococcal Polysaccharide-23 08/23/2010   Tdap 04/16/2012   Typhoid  Inactivated 05/20/2016   Zoster Recombinat (Shingrix) 02/28/2017, 05/16/2017   Zoster, Live 09/18/2015      Flu Vaccine status: Up to date  Pneumococcal vaccine status: Up to date  Covid-19 vaccine status: Completed vaccines  Qualifies for Shingles Vaccine? Yes   Zostavax completed Yes   Shingrix Completed?: Yes  Screening Tests Health Maintenance  Topic Date Due   DTaP/Tdap/Td (2 - Td or Tdap) 04/16/2022   COVID-19 Vaccine (7 - 2023-24 season) 02/26/2023 (Originally 10/13/2022)   INFLUENZA VACCINE  06/08/2023   Medicare Annual Wellness (AWV)  02/10/2024   Pneumonia Vaccine 9165+ Years old  Completed   Hepatitis C Screening  Completed   Zoster Vaccines- Shingrix  Completed   HPV VACCINES  Aged Out   COLONOSCOPY (Pts 45-4441yrs Insurance coverage will need to be confirmed)  Discontinued    Health Maintenance  Health Maintenance Due  Topic Date Due   DTaP/Tdap/Td (2 - Td or Tdap) 04/16/2022    Colorectal cancer screening: No longer required.   Lung Cancer Screening: (Low Dose CT Chest recommended if Age 4-80 years, 30 pack-year currently smoking OR have quit w/in 15years.) does not qualify.     Additional Screening:  Hepatitis C Screening: does qualify; Completed 10/20/17  Vision Screening: Recommended annual ophthalmology exams for early detection of glaucoma and other disorders of the eye. Is the patient up to date with their annual eye exam?  Yes  Who is the provider or what is the name of the office in which the patient attends annual eye exams? Deferred If pt is not established with a provider, would they like to be referred to a provider to establish care? No .   Dental Screening: Recommended annual dental exams for proper oral hygiene  Community Resource Referral / Chronic Care Management:  CRR required this visit?  No   CCM required this visit?  No      Plan:     I have personally reviewed and noted the following in the patient's chart:   Medical  and social history Use of alcohol, tobacco or illicit drugs  Current medications and supplements including opioid prescriptions. Patient is not currently taking opioid prescriptions. Functional ability and status Nutritional status Physical activity Advanced directives List of other physicians Hospitalizations, surgeries, and ER visits in previous 12 months Vitals Screenings to include cognitive, depression, and falls Referrals and appointments  In addition, I have reviewed and discussed with patient certain preventive protocols, quality metrics, and best practice recommendations. A written personalized care plan for preventive services as well as general preventive health recommendations were provided to patient.     Tillie RungBeverly W Costantino Kohlbeck, LPN   8/2/95624/03/2023   Nurse Notes: None

## 2023-02-24 ENCOUNTER — Other Ambulatory Visit: Payer: Self-pay | Admitting: Family Medicine

## 2023-05-02 DIAGNOSIS — Z96652 Presence of left artificial knee joint: Secondary | ICD-10-CM | POA: Diagnosis not present

## 2023-05-02 DIAGNOSIS — Z471 Aftercare following joint replacement surgery: Secondary | ICD-10-CM | POA: Diagnosis not present

## 2023-05-02 DIAGNOSIS — S72401D Unspecified fracture of lower end of right femur, subsequent encounter for closed fracture with routine healing: Secondary | ICD-10-CM | POA: Diagnosis not present

## 2023-05-02 DIAGNOSIS — Z96651 Presence of right artificial knee joint: Secondary | ICD-10-CM | POA: Diagnosis not present

## 2023-05-08 ENCOUNTER — Other Ambulatory Visit: Payer: Self-pay | Admitting: Family Medicine

## 2023-06-23 ENCOUNTER — Other Ambulatory Visit: Payer: Medicare Other

## 2023-06-23 DIAGNOSIS — Z125 Encounter for screening for malignant neoplasm of prostate: Secondary | ICD-10-CM

## 2023-06-23 LAB — PSA, MEDICARE: PSA: 3.04 ng/ml (ref 0.10–4.00)

## 2023-06-27 ENCOUNTER — Encounter: Payer: Self-pay | Admitting: Family Medicine

## 2023-06-27 ENCOUNTER — Ambulatory Visit (INDEPENDENT_AMBULATORY_CARE_PROVIDER_SITE_OTHER): Payer: Medicare Other | Admitting: Family Medicine

## 2023-06-27 VITALS — BP 132/70 | HR 85 | Temp 98.2°F | Ht 74.5 in | Wt 244.6 lb

## 2023-06-27 DIAGNOSIS — E785 Hyperlipidemia, unspecified: Secondary | ICD-10-CM | POA: Diagnosis not present

## 2023-06-27 DIAGNOSIS — E1165 Type 2 diabetes mellitus with hyperglycemia: Secondary | ICD-10-CM

## 2023-06-27 DIAGNOSIS — I7121 Aneurysm of the ascending aorta, without rupture: Secondary | ICD-10-CM | POA: Diagnosis not present

## 2023-06-27 DIAGNOSIS — I10 Essential (primary) hypertension: Secondary | ICD-10-CM | POA: Diagnosis not present

## 2023-06-27 LAB — POCT GLYCOSYLATED HEMOGLOBIN (HGB A1C): Hemoglobin A1C: 6.1 % — AB (ref 4.0–5.6)

## 2023-06-27 NOTE — Patient Instructions (Signed)
A1C is improved today at 6.1%  Keep up the good work.  Let's plan on 6 month follow up.

## 2023-06-27 NOTE — Progress Notes (Signed)
Established Patient Office Visit  Subjective   Patient ID: Paul Townsend, male    DOB: 1946/06/13  Age: 77 y.o. MRN: [id number]  Chief Complaint  Patient presents with   Medical Management of Chronic Issues    HPI   Paul Townsend is seen for medical follow-up.  He has history of hypertension, ascending aortic aneurysm, diverticular disease of the colon, hyperlipidemia, and type 2 diabetes.  Last A1c several months ago was 6.7% which is increased from his usual prediabetic range.  He has been walking 3 miles per day.  Tries to follow relatively low glycemic diet.  No polyuria or polydipsia.  Not monitoring blood sugars regularly.  Blood pressure treated with lisinopril 10 mg daily and HCTZ 12.5 mg daily.  No significant dizziness.  Blood pressure initially up today but improved after rest.  Hyperlipidemia treated with Vytorin.  Compliant with medications.  He has history of aneurysmal disease aortic root and ascending thoracic aorta measuring 4.3 cm greatest diameter.  Last scan was in March.  No recent chest pains.  Generally doing well.  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 He has never used smokeless tobacco. He reports current alcohol use of about 7.0 standard drinks of alcohol per week. No history on file for drug use. family history includes Arthritis in his father; Cancer in his mother; Heart disease (age of onset: 28) in his father; Hypertension in his father. No Known Allergies  Review of Systems  Constitutional:  Negative for malaise/fatigue.  Eyes:  Negative for blurred vision.  Respiratory:  Negative for shortness  of breath.   Cardiovascular:  Negative for chest pain.  Neurological:  Negative for dizziness, weakness and headaches.      Objective:     BP 132/70 (BP Location: Left Arm, Cuff Size: Normal)   Pulse 85   Temp 98.2 F (36.8 C) (Oral)   Ht 6' 2.5" (1.892 m)   Wt 244 lb 9.6 oz (110.9 kg)   SpO2 96%   BMI 30.98 kg/m  BP Readings from Last 3 Encounters:  06/27/23 132/70  01/27/23 120/62  12/23/22 120/60   Wt Readings from Last 3 Encounters:  06/27/23 244 lb 9.6 oz (110.9 kg)  02/10/23 239 lb (108.4 kg)  01/27/23 242 lb (109.8 kg)      Physical Exam Vitals reviewed.  Constitutional:      Appearance: He is well-developed.  Neck:     Thyroid: No thyromegaly.  Cardiovascular:     Rate and Rhythm: Normal rate and regular rhythm.  Pulmonary:     Effort: Pulmonary effort is normal. No respiratory distress.     Breath sounds: Normal breath sounds. No wheezing or rales.  Musculoskeletal:     Cervical back: Neck supple.  Neurological:     Mental Status: He is alert and oriented to person, place, and time.      Results for orders placed or performed in visit on 06/27/23  POC HgB A1c  Result Value Ref Range   Hemoglobin A1C 6.1 (A) 4.0 -  5.6 %   HbA1c POC (<> result, manual entry)     HbA1c, POC (prediabetic range)     HbA1c, POC (controlled diabetic range)      Last CBC Lab Results  Component Value Date   WBC 7.7 11/30/2020   HGB 16.0 11/30/2020   HCT 46.8 11/30/2020   MCV 94.0 11/30/2020   MCH 31.7 09/25/2018   RDW 13.1 11/30/2020   PLT 205.0 11/30/2020   Last metabolic panel Lab Results  Component Value Date   GLUCOSE 137 (H) 11/24/2022   NA 135 11/24/2022   K 4.1 11/24/2022   CL 98 11/24/2022   CO2 28 11/24/2022   BUN 12 11/24/2022   CREATININE 0.96 11/24/2022   GFR 76.20 11/24/2022   CALCIUM 9.3 11/24/2022   PROT 6.7 11/24/2022   ALBUMIN 4.2 11/24/2022   BILITOT 1.6 (H) 11/24/2022   ALKPHOS 46 11/24/2022   AST 16 11/24/2022   ALT 14  11/24/2022   ANIONGAP 12 09/25/2018   Last lipids Lab Results  Component Value Date   CHOL 138 11/24/2022   HDL 54.10 11/24/2022   LDLCALC 61 11/24/2022   LDLDIRECT 114.7 08/19/2013   TRIG 118.0 11/24/2022   CHOLHDL 3 11/24/2022   Last hemoglobin A1c Lab Results  Component Value Date   HGBA1C 6.1 (A) 06/27/2023   Last thyroid functions Lab Results  Component Value Date   TSH 1.75 11/30/2020      The 10-year ASCVD risk score (Arnett DK, et al., 2019) is: 32%    Assessment & Plan:   #1 type 2 diabetes with history of previous A1c 6.7%.  This is improved today to 6.1%.  Continue healthy lifestyle habits.  Continue regular walking and low glycemic diet.  Reassess in 6 months  #2 hypertension.  Initial elevated reading but improved after rest.  Continue HCTZ and lisinopril.  Continue low-sodium diet.  #3 history of ascending aortic aneurysm.  Stable by recent CTA in March.  This needs to be followed yearly  #4 hyperlipidemia on Vytorin.  Lipids were well-controlled in January with total cholesterol 138 and LDL 61.  Reassess at 67-month follow-up No follow-ups on file.    Paul Peat, MD

## 2023-09-06 DIAGNOSIS — Z961 Presence of intraocular lens: Secondary | ICD-10-CM | POA: Diagnosis not present

## 2023-09-06 DIAGNOSIS — H5201 Hypermetropia, right eye: Secondary | ICD-10-CM | POA: Diagnosis not present

## 2023-09-06 DIAGNOSIS — H35373 Puckering of macula, bilateral: Secondary | ICD-10-CM | POA: Diagnosis not present

## 2023-09-06 DIAGNOSIS — H5212 Myopia, left eye: Secondary | ICD-10-CM | POA: Diagnosis not present

## 2023-11-03 ENCOUNTER — Other Ambulatory Visit: Payer: Self-pay | Admitting: Family Medicine

## 2023-11-21 ENCOUNTER — Other Ambulatory Visit: Payer: Self-pay | Admitting: Family Medicine

## 2023-11-24 DIAGNOSIS — Z23 Encounter for immunization: Secondary | ICD-10-CM | POA: Diagnosis not present

## 2024-02-15 ENCOUNTER — Encounter: Admitting: Family Medicine

## 2024-02-19 ENCOUNTER — Other Ambulatory Visit: Payer: Self-pay | Admitting: Family Medicine

## 2024-05-01 ENCOUNTER — Other Ambulatory Visit: Payer: Self-pay | Admitting: Family Medicine

## 2024-05-03 ENCOUNTER — Ambulatory Visit (INDEPENDENT_AMBULATORY_CARE_PROVIDER_SITE_OTHER)

## 2024-05-03 ENCOUNTER — Ambulatory Visit (INDEPENDENT_AMBULATORY_CARE_PROVIDER_SITE_OTHER): Admitting: Family Medicine

## 2024-05-03 ENCOUNTER — Encounter: Payer: Self-pay | Admitting: Family Medicine

## 2024-05-03 VITALS — Ht 74.5 in | Wt 233.4 lb

## 2024-05-03 VITALS — BP 132/80 | HR 72 | Temp 97.8°F | Ht 74.41 in | Wt 235.8 lb

## 2024-05-03 DIAGNOSIS — I10 Essential (primary) hypertension: Secondary | ICD-10-CM

## 2024-05-03 DIAGNOSIS — Z125 Encounter for screening for malignant neoplasm of prostate: Secondary | ICD-10-CM

## 2024-05-03 DIAGNOSIS — R7303 Prediabetes: Secondary | ICD-10-CM

## 2024-05-03 DIAGNOSIS — I2510 Atherosclerotic heart disease of native coronary artery without angina pectoris: Secondary | ICD-10-CM | POA: Insufficient documentation

## 2024-05-03 DIAGNOSIS — Z0000 Encounter for general adult medical examination without abnormal findings: Secondary | ICD-10-CM | POA: Diagnosis not present

## 2024-05-03 DIAGNOSIS — E785 Hyperlipidemia, unspecified: Secondary | ICD-10-CM | POA: Diagnosis not present

## 2024-05-03 DIAGNOSIS — I7121 Aneurysm of the ascending aorta, without rupture: Secondary | ICD-10-CM | POA: Diagnosis not present

## 2024-05-03 DIAGNOSIS — F321 Major depressive disorder, single episode, moderate: Secondary | ICD-10-CM | POA: Diagnosis not present

## 2024-05-03 LAB — COMPREHENSIVE METABOLIC PANEL WITH GFR
ALT: 15 U/L (ref 0–53)
AST: 21 U/L (ref 0–37)
Albumin: 4.4 g/dL (ref 3.5–5.2)
Alkaline Phosphatase: 46 U/L (ref 39–117)
BUN: 13 mg/dL (ref 6–23)
CO2: 27 meq/L (ref 19–32)
Calcium: 9.3 mg/dL (ref 8.4–10.5)
Chloride: 100 meq/L (ref 96–112)
Creatinine, Ser: 0.98 mg/dL (ref 0.40–1.50)
GFR: 73.59 mL/min (ref >60.00)
Glucose, Bld: 117 mg/dL — ABNORMAL HIGH (ref 70–99)
Potassium: 4.3 meq/L (ref 3.5–5.1)
Sodium: 135 meq/L (ref 135–145)
Total Bilirubin: 1.2 mg/dL (ref 0.2–1.2)
Total Protein: 7.1 g/dL (ref 6.0–8.3)

## 2024-05-03 LAB — LIPID PANEL
Cholesterol: 151 mg/dL (ref 0–200)
HDL: 57.8 mg/dL (ref >39.00)
LDL Cholesterol: 75 mg/dL (ref 0–99)
NonHDL: 93.17
Total CHOL/HDL Ratio: 3
Triglycerides: 93 mg/dL (ref 0.0–149.0)
VLDL: 18.6 mg/dL (ref 0.0–40.0)

## 2024-05-03 LAB — HEMOGLOBIN A1C: Hgb A1c MFr Bld: 6.5 % (ref 4.6–6.5)

## 2024-05-03 LAB — PSA, MEDICARE: PSA: 3.31 ng/mL (ref 0.10–4.00)

## 2024-05-03 MED ORDER — EZETIMIBE-SIMVASTATIN 10-20 MG PO TABS: ORAL_TABLET | ORAL | 3 refills | Status: AC

## 2024-05-03 MED ORDER — HYDROCHLOROTHIAZIDE 12.5 MG PO CAPS: ORAL_CAPSULE | ORAL | 3 refills | Status: AC

## 2024-05-03 MED ORDER — LISINOPRIL 10 MG PO TABS: 10.0000 mg | ORAL_TABLET | Freq: Every day | ORAL | 3 refills | Status: AC

## 2024-05-03 MED ORDER — SERTRALINE HCL 50 MG PO TABS: 50.0000 mg | ORAL_TABLET | Freq: Every day | ORAL | 1 refills | Status: DC

## 2024-05-03 NOTE — Patient Instructions (Addendum)
 [name] , Thank you for taking time out of your busy schedule to complete your Annual Wellness Visit with me. I enjoyed our conversation and look forward to speaking with you again next year. I, as well as your care team,  appreciate your ongoing commitment to your health goals. Please review the following plan we discussed and let me know if I can assist you in the future. Your Game plan/ To Do List    Referrals: If you haven't heard from the office you've been referred to, please reach out to them at the phone provided.   Follow up Visits: Next Medicare AWV with our clinical staff: 05/12/25 @ 8a   Have you seen your provider in the last 6 months (3 months if uncontrolled diabetes)? Yes Next Office Visit with your provider: 05/03/24 @ 8:45a  Clinician Recommendations:  Aim for 30 minutes of exercise or brisk walking, 6-8 glasses of water, and 5 servings of fruits and vegetables each day.       This is a list of the screening recommended for you and due dates:  Health Maintenance  Topic Date Due   DTaP/Tdap/Td vaccine (2 - Td or Tdap) 04/16/2022   COVID-19 Vaccine (8 - 2024-25 season) 05/23/2024   Flu Shot  06/07/2024   Medicare Annual Wellness Visit  05/03/2025   Pneumococcal Vaccine for age over 35  Completed   Hepatitis C Screening  Completed   Zoster (Shingles) Vaccine  Completed   Hepatitis B Vaccine  Aged Out   HPV Vaccine  Aged Out   Meningitis B Vaccine  Aged Out   Colon Cancer Screening  Discontinued    Advanced directives: (Copy Requested) Please bring a copy of your health care power of attorney and living will to the office to be added to your chart at your convenience. You can mail to [facility] [address] [place], [place] [address] or email to [email or web address] Advance Care Planning is important because it:  [x]  Makes sure you receive the medical care that is consistent with your values, goals, and preferences  [x]  It provides guidance  to your family and loved ones and reduces their decisional burden about whether or not they are making the right decisions based on your wishes.  Follow the link provided in your after visit summary or read over the paperwork we have mailed to you to help you started getting your Advance Directives in place. If you need assistance in completing these, please reach out to us  so that we can help you!  See attachments for Preventive Care and Fall Prevention Tips.

## 2024-05-03 NOTE — Progress Notes (Addendum)
 Established Patient Office Visit  Subjective   Patient ID: Paul Townsend, male    DOB: 11/14/1945  Age: 78 y.o. MRN: [id number]  Chief Complaint  Patient presents with   Annual Exam    HPI   Paul Townsend is seen today for annual medical follow-up.  He has history of ascending aortic aneurysm, hypertension, hyperlipidemia, CAD, prediabetes, osteoarthritis with prior total knee replacements bilaterally, GERD.  He has recently had some abdominal bloating and increased gas symptoms.  Has taken over-the-counter Gas-X which helps slightly.  He has history of GERD and for years has taken omeprazole 20 mg daily but not sure if this is really offering any benefits.  His other major concern today is some increased depression symptoms really for months.  Decreased motivation and some anhedonia.  No suicidal ideation.  He spent much of his adult life in the [employer] and high level of achievement in [employer] and work life.   Trying to discover his purpose for this life stage and has had some difficulties adjusting to life currently.  His wife has also had several health issues which has made things like travel for them difficult.  He is trying to adjust to multiple changes.  He is currently writing a series of novels.  Past Medical History:  Diagnosis Date   Arthritis    Hyperlipidemia    Hypertension    Past Surgical History:  Procedure Laterality Date   ORIF Rt femur Right 01/05/2014   TONSILLECTOMY     TOOTH EXTRACTION     3 teeth   TOTAL KNEE ARTHROPLASTY     VARICOSE VEIN SURGERY      reports that he has quit smoking. He has never used smokeless tobacco. He reports current alcohol use of about 7.0 standard drinks of alcohol per week. No history on file for drug use. family history includes Arthritis in his father; Cancer in his mother; Heart disease (age of onset: 64) in his father; Hypertension in his father. No Known Allergies  Review of Systems  Constitutional:  Negative for chills,  fever and weight loss.  HENT:  Negative for hearing loss.   Eyes:  Negative for blurred vision and double vision.  Respiratory:  Negative for cough and shortness of breath.   Cardiovascular:  Negative for chest pain, palpitations and leg swelling.  Gastrointestinal:  Negative for blood in stool, nausea and vomiting.  Genitourinary:  Negative for dysuria.  Skin:  Negative for rash.  Neurological:  Negative for dizziness, speech change, seizures, loss of consciousness and headaches.  Psychiatric/Behavioral:  Positive for depression. Negative for suicidal ideas.       Objective:     BP 132/80 (BP Location: Left Arm, Patient Position: Sitting, Cuff Size: Normal)   Pulse 72   Temp 97.8 F (36.6 C) (Oral)   Ht 6' 2.41 (1.89 m)   Wt 235 lb 12.8 oz (107 kg)   SpO2 95%   BMI 29.94 kg/m  BP Readings from Last 3 Encounters:  05/03/24 132/80  06/27/23 132/70  01/27/23 120/62   Wt Readings from Last 3 Encounters:  05/03/24 235 lb 12.8 oz (107 kg)  05/03/24 233 lb 6.4 oz (105.9 kg)  06/27/23 244 lb 9.6 oz (110.9 kg)      Physical Exam Vitals reviewed.  Constitutional:      General: He is not in acute distress.    Appearance: He is well-developed. He is not ill-appearing.  HENT:     Right Ear: External ear normal.  Left Ear: External ear normal.   Eyes:     Pupils: Pupils are equal, round, and reactive to light.   Neck:     Thyroid : No thyromegaly.   Cardiovascular:     Rate and Rhythm: Normal rate and regular rhythm.  Pulmonary:     Effort: Pulmonary effort is normal. No respiratory distress.     Breath sounds: Normal breath sounds. No wheezing or rales.   Musculoskeletal:     Cervical back: Neck supple.     Right lower leg: No edema.     Left lower leg: No edema.   Neurological:     Mental Status: He is alert and oriented to person, place, and time.      No results found for any visits on 05/03/24.    The 10-year ASCVD risk score (Arnett DK, et al.,  2019) is: 34.1%    Assessment & Plan:   #1 hypertension.  Stable.  Continue lisinopril  and HCTZ.  Refill sent.  Check comprehensive metabolic panel.  Continue lower sodium diet  #2 hyperlipidemia.  Patient on Vytorin .  Recheck lipid and CMP.  Continue low saturated fat diet  #3 history of prediabetes/borderline diabetes.  Last A1c 6.1%.  Recheck A1c today  #4  Thoracic aortic aneurysm-involving  ascending aorta.  Recommendation has been for annual CT angiogram aorta and this will be ordered  #5 major depressive episode.  Several months of increasing depression symptoms.  Discussed possible counseling.  Initiate sertraline  50 mg once daily.  Set up follow-up in 4 to 6 weeks to reassess  #6 abdominal bloating/gas.  Handout on FODMAPs given.  Consider trial of over-the-counter Florastor.   Return in about 5 weeks (around 06/07/2024).    Wolm Scarlet, MD

## 2024-05-03 NOTE — Progress Notes (Signed)
 Subjective:   Paul Townsend is a 78 y.o. who presents for a Medicare Wellness preventive visit.  As a reminder, Annual Wellness Visits don't include a physical exam, and some assessments may be limited, especially if this visit is performed virtually. We may recommend an in-person follow-up visit with your provider if needed.  Visit Complete: Virtual I connected with  Paul Townsend on 05/03/24 by a video and audio enabled telemedicine application and verified that I am speaking with the correct person using two identifiers.  Patient Location: Home  Provider Location: Home Office  I discussed the limitations of evaluation and management by telemedicine. The patient expressed understanding and agreed to proceed.  Vital Signs: Because this visit was a virtual/telehealth visit, some criteria may be missing or patient reported. Any vitals not documented were not able to be obtained and vitals that have been documented are patient reported.    Persons Participating in Visit: Patient.  AWV Questionnaire: No: Patient Medicare AWV questionnaire was not completed prior to this visit.  Cardiac Risk Factors include: advanced age (>73men, >21 women);male gender;hypertension     Objective:    Today's Vitals   05/03/24 0817  Weight: 233 lb 6.4 oz (105.9 kg)  Height: 6' 2.5 (1.892 m)   Body mass index is 29.57 kg/m.     05/03/2024    8:24 AM 02/10/2023    4:00 PM 02/01/2022    3:54 PM 09/25/2018   10:21 AM 10/20/2017    9:53 AM  Advanced Directives  Does Patient Have a Medical Advance Directive? Yes Yes Yes Yes  Yes   Type of Estate agent of [place];Living will Healthcare Power of [place];Living will Healthcare Power of [place];Living will Healthcare Power of [place];Living will   Copy of Healthcare Power of Attorney in Chart? No - copy requested No - copy requested No - copy requested    Would patient like information on creating a medical advance  directive?    No - Patient declined       Data saved with a previous flowsheet row definition    Current Medications (verified) Outpatient Encounter Medications as of 05/03/2024  Medication Sig   Ascorbic Acid (VITAMIN C PO) Take by mouth.   aspirin 81 MG chewable tablet Chew 81 mg by mouth.   Calcium Citrate-Vitamin D  (CALCIUM + D PO) Take by mouth.   ezetimibe -simvastatin  (VYTORIN ) 10-20 MG tablet TAKE 1 TABLET AT BEDTIME (NEED AN APPOINTMENT)   hydrochlorothiazide  (MICROZIDE ) 12.5 MG capsule TAKE 1 CAPSULE DAILY (NEED AN APPOINTMENT)   lisinopril  (ZESTRIL ) 10 MG tablet TAKE 1 TABLET DAILY   Misc Natural Products (TART CHERRY ADVANCED PO) Take 1 capsule by mouth daily.   omeprazole (PRILOSEC OTC) 20 MG tablet Take by mouth.   Potassium 99 MG TABS Take 1 tablet by mouth daily.   Prasterone, DHEA, (DHEA 50 PO) Take by mouth.   vitamin B-12 (CYANOCOBALAMIN ) 1000 MCG tablet Take 1,000 mcg by mouth daily.   Zinc Acetate, Oral, (ZINC ACETATE PO) Take by mouth.   No facility-administered encounter medications on file as of 05/03/2024.    Allergies (verified) Patient has no known allergies.   History: Past Medical History:  Diagnosis Date   Arthritis    Hyperlipidemia    Hypertension    Past Surgical History:  Procedure Laterality Date   ORIF Rt femur Right 01/05/2014   TONSILLECTOMY     TOOTH EXTRACTION     3 teeth   TOTAL KNEE ARTHROPLASTY  VARICOSE VEIN SURGERY     Family History  Problem Relation Age of Onset   Cancer Mother        colon   Arthritis Father    Hypertension Father    Heart disease Father 55       MI   Social History   Socioeconomic History   Marital status: Married    Spouse name: Not on file   Number of children: Not on file   Years of education: Not on file   Highest education level: Not on file  Occupational History   Not on file  Tobacco Use   Smoking status: Former   Smokeless tobacco: Never   Tobacco comments:    Did not review today  due to time limits   Vaping Use   Vaping status: Never Used  Substance and Sexual Activity   Alcohol use: Yes    Alcohol/week: 7.0 standard drinks of alcohol    Types: 7 Glasses of wine per week    Comment: wine   Drug use: Not on file   Sexual activity: Not on file  Other Topics Concern   Not on file  Social History Narrative   Not on file   Social Drivers of Health   Financial Resource Strain: Low Risk  (05/03/2024)   Overall Financial Resource Strain (CARDIA)    Difficulty of Paying Living Expenses: Not hard at all  Food Insecurity: No Food Insecurity (05/03/2024)   Hunger Vital Sign    Worried About Running Out of Food in the Last Year: Never true    Ran Out of Food in the Last Year: Never true  Transportation Needs: No Transportation Needs (05/03/2024)   PRAPARE - Administrator, Civil Service (Medical): No    Lack of Transportation (Non-Medical): No  Physical Activity: Sufficiently Active (05/03/2024)   Exercise Vital Sign    Days of Exercise per Week: 5 days    Minutes of Exercise per Session: 40 min  Stress: No Stress Concern Present (05/03/2024)   [employer] of Occupational Health - Occupational Stress Questionnaire    Feeling of Stress: Not at all  Social Connections: Socially Integrated (05/03/2024)   Social Connection and Isolation Panel    Frequency of Communication with Friends and Family: More than three times a week    Frequency of Social Gatherings with Friends and Family: More than three times a week    Attends Religious Services: More than 4 times per year    Active Member of [employer] or Organizations: Yes    Attends Engineer, structural: More than 4 times per year    Marital Status: Married    Tobacco Counseling Counseling given: Not Answered Tobacco comments: Did not review today due to time limits     Clinical Intake:  Pre-visit preparation completed: Yes  Pain : No/denies pain     BMI - recorded: 29.57 Nutritional  Status: BMI 25 -29 Overweight Nutritional Risks: None Diabetes: No  Lab Results  Component Value Date   HGBA1C 6.1 (A) 06/27/2023   HGBA1C 6.7 (H) 11/24/2022   HGBA1C 6.3 12/01/2021     How often do you need to have someone help you when you read instructions, pamphlets, or other written materials from your doctor or pharmacy?: 1 - Never  Interpreter Needed?: No  Information entered by :: Rojelio Blush LPN   Activities of Daily Living     05/03/2024    8:23 AM  In your present state of  health, do you have any difficulty performing the following activities:  Hearing? 0  Vision? 0  Difficulty concentrating or making decisions? 0  Walking or climbing stairs? 0  Dressing or bathing? 0  Doing errands, shopping? 0  Preparing Food and eating ? N  Using the Toilet? N  In the past six months, have you accidently leaked urine? N  Do you have problems with loss of bowel control? N  Managing your Medications? N  Managing your Finances? N  Housekeeping or managing your Housekeeping? N    Patient Care Team: Micheal Wolm ORN, MD as PCP - General Delford Maude BROCKS, MD as PCP - Cardiology (Cardiology)  I have updated your Care Teams any recent Medical Services you may have received from other providers in the past year.     Assessment:   This is a routine wellness examination for Paul Townsend.  Hearing/Vision screen Hearing Screening - Comments:: Denies hearing difficulties   Vision Screening - Comments:: Wears rx glasses - up to date with [facility] exams with  [facility]   Goals Addressed               This Visit's Progress     Increase physical activity (pt-stated)        Remain active. Continue to walk.       Depression Screen     05/03/2024    8:21 AM 02/10/2023    4:06 PM 11/11/2022   10:03 AM 02/01/2022    3:56 PM 12/01/2021    9:17 AM 10/08/2021    8:00 AM 11/26/2019    7:54 AM  PHQ 2/9 Scores  PHQ - 2 Score 0 0 0 0 0 0 1  PHQ- 9 Score       4    Fall  Risk     05/03/2024    8:23 AM 02/10/2023    3:57 PM 11/11/2022   10:03 AM 02/01/2022    3:55 PM 12/01/2021    9:18 AM  Fall Risk   Falls in the past year? 0 0 0 0 0  Number falls in past yr: 0 0 0    Injury with Fall? 0 0 0    Risk for fall due to : No Fall Risks No Fall Risks No Fall Risks Medication side effect   Follow up Falls evaluation completed Falls prevention discussed Falls evaluation completed  Falls evaluation completed;Education provided;Falls prevention discussed       Data saved with a previous flowsheet row definition    MEDICARE RISK AT HOME:  Medicare Risk at Home Any stairs in or around the home?: No If so, are there any without handrails?: No Home free of loose throw rugs in walkways, pet beds, electrical cords, etc?: Yes Adequate lighting in your home to reduce risk of falls?: Yes Life alert?: No Use of a cane, walker or w/c?: No Grab bars in the bathroom?: Yes Shower chair or bench in shower?: Yes Elevated toilet seat or a handicapped toilet?: No  TIMED UP AND GO:  Was the test performed?  No  Cognitive Function: 6CIT completed        05/03/2024    8:24 AM 02/10/2023    4:00 PM 02/01/2022    3:59 PM  6CIT Screen  What Year? 0 points 0 points 0 points  What month? 0 points 0 points 0 points  What time? 0 points 0 points 0 points  Count back from 20 0 points 0 points 0 points  Months  in reverse 0 points 0 points 0 points  Repeat phrase 0 points 0 points 0 points  Total Score 0 points 0 points 0 points    Immunizations Immunization History  Administered Date(s) Administered   Fluad Quad(high Dose 65+) 07/16/2019, 08/13/2020   Hepatitis A 04/16/2012   Hepatitis A, Adult 05/20/2016   Influenza Whole 08/23/2010   Influenza, High Dose Seasonal PF 10/29/2014, 09/18/2015, 10/18/2016, 08/18/2022   Influenza,inj,Quad PF,6+ Mos 08/19/2013, 07/30/2018   Influenza-Unspecified 09/18/2015, 07/11/2017, 09/23/2021, 11/24/2023   Moderna Covid-19 Fall Seasonal  Vaccine 80yrs & older 11/24/2023   Moderna Covid-19 Vaccine  Bivalent Booster 52yrs & up 08/18/2022   Moderna Sars-Covid-2 Vaccination 12/20/2019, 01/17/2020, 09/12/2020, 04/27/2021   PFIZER Comirnaty(Gray Top)Covid-19 Tri-Sucrose Vaccine 09/23/2021   Pneumococcal Conjugate-13 05/27/2015   Pneumococcal Polysaccharide-23 08/23/2010   Tdap 04/16/2012   Typhoid Inactivated 05/20/2016   Zoster Recombinant(Shingrix) 02/28/2017, 05/16/2017   Zoster, Live 09/18/2015    Screening Tests Health Maintenance  Topic Date Due   DTaP/Tdap/Td (2 - Td or Tdap) 04/16/2022   COVID-19 Vaccine (8 - 2024-25 season) 05/23/2024   INFLUENZA VACCINE  06/07/2024   Medicare Annual Wellness (AWV)  05/03/2025   Pneumococcal Vaccine: 50+ Years  Completed   Hepatitis C Screening  Completed   Zoster Vaccines- Shingrix  Completed   Hepatitis B Vaccines  Aged Out   HPV VACCINES  Aged Out   Meningococcal B Vaccine  Aged Out   Colonoscopy  Discontinued    Health Maintenance  Health Maintenance Due  Topic Date Due   DTaP/Tdap/Td (2 - Td or Tdap) 04/16/2022   Health Maintenance Items Addressed:   Additional Screening:  Vision Screening: Recommended annual ophthalmology exams for early detection of glaucoma and other disorders of the eye. Would you like a referral to an eye doctor? No    Dental Screening: Recommended annual dental exams for proper oral hygiene  Community Resource Referral / Chronic Care Management: CRR required this visit?  No   CCM required this visit?  No   Plan:    I have personally reviewed and noted the following in the patient's chart:   Medical and social history Use of alcohol, tobacco or illicit drugs  Current medications and supplements including opioid prescriptions. Patient is not currently taking opioid prescriptions. Functional ability and status Nutritional status Physical activity Advanced directives List of other physicians Hospitalizations, surgeries, and ER  visits in previous 12 months Vitals Screenings to include cognitive, depression, and falls Referrals and appointments  In addition, I have reviewed and discussed with patient certain preventive protocols, quality metrics, and best practice recommendations. A written personalized care plan for preventive services as well as general preventive health recommendations were provided to patient.   Rojelio LELON Blush, LPN   3/72/7974   After Visit Summary: (MyChart) Due to this being a telephonic visit, the after visit summary with patients personalized plan was offered to patient via MyChart   Notes: Nothing significant to report at this time.

## 2024-05-05 ENCOUNTER — Ambulatory Visit: Payer: Self-pay | Admitting: Family Medicine

## 2024-05-07 DIAGNOSIS — M25561 Pain in right knee: Secondary | ICD-10-CM | POA: Diagnosis not present

## 2024-05-07 DIAGNOSIS — Z96651 Presence of right artificial knee joint: Secondary | ICD-10-CM | POA: Diagnosis not present

## 2024-05-07 DIAGNOSIS — Z96652 Presence of left artificial knee joint: Secondary | ICD-10-CM | POA: Diagnosis not present

## 2024-05-07 DIAGNOSIS — S72401D Unspecified fracture of lower end of right femur, subsequent encounter for closed fracture with routine healing: Secondary | ICD-10-CM | POA: Diagnosis not present

## 2024-05-23 ENCOUNTER — Encounter (HOSPITAL_BASED_OUTPATIENT_CLINIC_OR_DEPARTMENT_OTHER): Payer: Self-pay

## 2024-05-23 ENCOUNTER — Ambulatory Visit (HOSPITAL_BASED_OUTPATIENT_CLINIC_OR_DEPARTMENT_OTHER)
Admission: RE | Admit: 2024-05-23 | Discharge: 2024-05-23 | Disposition: A | Discharge status: Home / Self Care | Source: Ambulatory Visit | Attending: Family Medicine | Admitting: Family Medicine

## 2024-05-23 DIAGNOSIS — I7121 Aneurysm of the ascending aorta, without rupture: Secondary | ICD-10-CM | POA: Diagnosis not present

## 2024-05-23 DIAGNOSIS — J9811 Atelectasis: Secondary | ICD-10-CM | POA: Diagnosis not present

## 2024-05-23 DIAGNOSIS — N281 Cyst of kidney, acquired: Secondary | ICD-10-CM | POA: Diagnosis not present

## 2024-05-23 MED ORDER — IOHEXOL 350 MG/ML SOLN
100.0000 mL | Freq: Once | INTRAVENOUS | Status: AC | PRN
  Administered 2024-05-23: 100 mL via INTRAVENOUS

## 2024-06-03 ENCOUNTER — Encounter: Payer: Self-pay | Admitting: Family Medicine

## 2024-06-03 ENCOUNTER — Ambulatory Visit (INDEPENDENT_AMBULATORY_CARE_PROVIDER_SITE_OTHER): Admitting: Family Medicine

## 2024-06-03 VITALS — BP 120/68 | HR 80 | Temp 98.1°F | Wt 232.6 lb

## 2024-06-03 DIAGNOSIS — I7121 Aneurysm of the ascending aorta, without rupture: Secondary | ICD-10-CM | POA: Diagnosis not present

## 2024-06-03 DIAGNOSIS — F321 Major depressive disorder, single episode, moderate: Secondary | ICD-10-CM

## 2024-06-03 DIAGNOSIS — I10 Essential (primary) hypertension: Secondary | ICD-10-CM | POA: Diagnosis not present

## 2024-06-03 NOTE — Progress Notes (Signed)
 Established Patient Office Visit  Subjective   Patient ID: Paul Townsend, male    DOB: [birth year]  Age: 78 y.o. MRN: [id number]  Chief Complaint  Patient presents with   Medical Management of Chronic Issues    HPI   Paul Townsend is here for medical follow-up.  Recently initiated sertraline  for depression.  He feels like he is done much better since that time but not sure how much of this is medication versus him being more engaged in activities.  Has been walking more and also more engaged in writing and going to baseball games.  Feels definitely improved from last visit.  Denies any side effects from Zoloft   Still having some bloating and increased burping after meals.  Has tried over-the-counter probiotic without much improvement.  Does drink carbonated flavored waters frequently which also contain artificial sweeteners.  He has hypertension which is controlled with lisinopril  HCTZ.  Takes Vytorin  for hyperlipidemia.  He has ascending aortic aneurysm stable by recent CT imaging at 4.3 cm.  No recent chest pains.  Past Medical History:  Diagnosis Date   Arthritis    Hyperlipidemia    Hypertension    Past Surgical History:  Procedure Laterality Date   ORIF Rt femur Right 01/05/2014   TONSILLECTOMY     TOOTH EXTRACTION     3 teeth   TOTAL KNEE ARTHROPLASTY     VARICOSE VEIN SURGERY      reports that he has quit smoking. He has never used smokeless tobacco. He reports current alcohol use of about 7.0 standard drinks of alcohol per week. No history on file for drug use. family history includes Arthritis in his father; Cancer in his mother; Heart disease (age of onset: 26) in his father; Hypertension in his father. No Known Allergies  Review of Systems  Constitutional:  Negative for malaise/fatigue.  Eyes:  Negative for blurred vision.  Respiratory:  Negative for shortness of breath.   Cardiovascular:  Negative for chest pain.  Neurological:  Negative for dizziness, weakness and  headaches.  Psychiatric/Behavioral:  Negative for depression.       Objective:     BP 120/68 (BP Location: Left Arm, Patient Position: Sitting, Cuff Size: Normal)   Pulse 80   Temp 98.1 F (36.7 C) (Oral)   Wt 232 lb 9.6 oz (105.5 kg)   SpO2 95%   BMI 29.54 kg/m  BP Readings from Last 3 Encounters:  06/03/24 120/68  05/03/24 132/80  06/27/23 132/70   Wt Readings from Last 3 Encounters:  06/03/24 232 lb 9.6 oz (105.5 kg)  05/03/24 235 lb 12.8 oz (107 kg)  05/03/24 233 lb 6.4 oz (105.9 kg)      Physical Exam Vitals reviewed.  Constitutional:      General: He is not in acute distress.    Appearance: He is well-developed. He is not ill-appearing.  HENT:     Right Ear: External ear normal.     Left Ear: External ear normal.  Eyes:     Pupils: Pupils are equal, round, and reactive to light.  Neck:     Thyroid : No thyromegaly.  Cardiovascular:     Rate and Rhythm: Normal rate and regular rhythm.  Pulmonary:     Effort: Pulmonary effort is normal. No respiratory distress.     Breath sounds: Normal breath sounds. No wheezing or rales.  Musculoskeletal:     Cervical back: Neck supple.  Neurological:     Mental Status: He is alert and oriented to  person, place, and time.      No results found for any visits on 06/03/24.    The 10-year ASCVD risk score (Arnett DK, et al., 2019) is: 29.7%    Assessment & Plan:   #1 major depression moderate severity improved since last visit.  Difficult to sort out how much of this may be related to sertraline  versus him getting out getting more engaged with activities.  In any event, he is significantly improved today with PHQ 9 score of 2.  Would consider minimum of 4 to 9 months of sertraline  therapy and then consider tapering off  #2 hypertension stable and well-controlled.  Continue regular aerobic activity and low-sodium diet.  Continue lisinopril  and hydrochlorothiazide   #3 thoracic ascending aortic aneurysm stable by  recent CT imaging.  Continue yearly imaging  Wolm Scarlet, MD

## 2024-07-01 ENCOUNTER — Ambulatory Visit: Payer: Self-pay

## 2024-07-01 NOTE — Telephone Encounter (Signed)
 FYI Only or Action Required?: FYI only for provider.  Patient was last seen in primary care on 06/03/2024 by Micheal Wolm ORN, MD.  Called Nurse Triage reporting Bloated.  Symptoms began about a month ago.  Interventions attempted: Nothing.  Symptoms are: unchanged.  Triage Disposition: See Physician Within 24 Hours  Patient/caregiver understands and will follow disposition?: Yes     Copied from CRM #[id number]. Topic: Clinical - Red Word Triage >> Jul 01, 2024  8:08 AM Mesmerise C wrote: [facility] that prompted transfer to Nurse Triage: patient stated he's having stomach issues, stated he's having gas and pain under right rib cage Reason for Disposition  [1] MILD abdomen pain (e.g., does not interfere with normal activities) AND [2] pain comes and goes (cramps) AND [3] present > 48 hours  (Exception: Mild abdomen pain is a chronic symptom, recurrent or ongoing AND present > 4 weeks.)  Answer Assessment - Initial Assessment Questions 1. SYMPTOM: What's the main symptom you're concerned about? (e.g., abdomen bloating, swelling)     Bloating, states constant gas 2. ONSET: When did pain  start?     States has talked to PCP about this, started over a month ago 3. SEVERITY: How bad is the bloating or swelling?     Bloating, moderate to severe, comes and goes 4. ABDOMEN PAIN:  Is there any abdomen pain? If Yes, ask: How bad is the pain?  (e.g., Scale 0-10; or none, mild, moderate, or severe)     mild 5. RELIEVING AND AGGRAVATING FACTORS: What makes it better or worse? (e.g., certain foods, lactose, medicines)     no 6. GI HISTORY: Do you have any history of stomach or intestine problems? (e.g., bowel obstruction, cancer, irritable bowel)      yes 7. CAUSE: What do you think is causing the bloating?      unknown 8. OTHER SYMPTOMS: Do you have any other symptoms? (e.g., belching, blood in stool, breathing difficulty, constipation, diarrhea, fever, passing gas, vomiting,  weight loss, white of eyes have turned yellow)     Belching,  9. PREGNANCY: Is there any chance you are pregnant? When was your last menstrual period?     na  Protocols used: Abdomen Bloating and Swelling-A-AH

## 2024-07-01 NOTE — Telephone Encounter (Signed)
 Patient has been scheduled for visit tomorrow 07/02/2024 regarding abdominal pain

## 2024-07-02 ENCOUNTER — Ambulatory Visit: Admitting: Family Medicine

## 2024-07-02 ENCOUNTER — Encounter: Payer: Self-pay | Admitting: Family Medicine

## 2024-07-02 VITALS — BP 134/80 | HR 71 | Temp 98.0°F | Wt 229.6 lb

## 2024-07-02 DIAGNOSIS — R1010 Upper abdominal pain, unspecified: Secondary | ICD-10-CM | POA: Diagnosis not present

## 2024-07-02 DIAGNOSIS — R140 Abdominal distension (gaseous): Secondary | ICD-10-CM

## 2024-07-02 DIAGNOSIS — R6881 Early satiety: Secondary | ICD-10-CM | POA: Diagnosis not present

## 2024-07-02 DIAGNOSIS — R634 Abnormal weight loss: Secondary | ICD-10-CM

## 2024-07-02 NOTE — Progress Notes (Signed)
 Established Patient Office Visit  Subjective   Patient ID: Paul Townsend, male    DOB: July 30, 1946  Age: 78 y.o. MRN: [id number]  Chief Complaint  Patient presents with   Abdominal Pain    HPI   Paul Townsend is seen today with some ongoing early satiety and frequent burping after meals.  He feels like he is bloated sometimes for hours after eating.  He feels like his appetite might be down slightly.  Has had some modest weight loss.  Weight about a year ago 244 pounds and currently down to 229 pounds.  Denies any nausea or vomiting.  No dysphagia.  No stool changes.  Relates colonoscopy about 2 years ago.  Tried probiotics without relief.  Already takes omeprazole 20 mg daily and Pepcid 20 mg daily as well.  Has had some twinges of pain right upper quadrant but not consistently postprandial.  Still walking 3 miles per day without difficulty.  No chest pains.  He really had more symptom of bloating and early satiety and fullness lasting hours after eating more so than pain aspect.  Recent comprehensive metabolic panel unremarkable.  Past Medical History:  Diagnosis Date   Arthritis    Hyperlipidemia    Hypertension    Past Surgical History:  Procedure Laterality Date   ORIF Rt femur Right 01/05/2014   TONSILLECTOMY     TOOTH EXTRACTION     3 teeth   TOTAL KNEE ARTHROPLASTY     VARICOSE VEIN SURGERY      reports that he has quit smoking. He has never used smokeless tobacco. He reports current alcohol use of about 7.0 standard drinks of alcohol per week. No history on file for drug use. family history includes Arthritis in his father; Cancer in his mother; Heart disease (age of onset: 75) in his father; Hypertension in his father. No Known Allergies  Review of Systems  Constitutional:  Positive for weight loss. Negative for chills and fever.  Respiratory:  Negative for cough and shortness of breath.   Cardiovascular:  Negative for chest pain.  Gastrointestinal:  Positive for  abdominal pain. Negative for blood in stool, constipation, diarrhea, melena, nausea and vomiting.  Genitourinary:  Negative for hematuria.      Objective:     BP 134/80   Pulse 71   Temp 98 F (36.7 C) (Oral)   Wt 229 lb 9.6 oz (104.1 kg)   SpO2 96%   BMI 29.16 kg/m  BP Readings from Last 3 Encounters:  07/02/24 134/80  06/03/24 120/68  05/03/24 132/80   Wt Readings from Last 3 Encounters:  07/02/24 229 lb 9.6 oz (104.1 kg)  06/03/24 232 lb 9.6 oz (105.5 kg)  05/03/24 235 lb 12.8 oz (107 kg)      Physical Exam Vitals reviewed.  Constitutional:      General: He is not in acute distress.    Appearance: He is not ill-appearing.  Cardiovascular:     Rate and Rhythm: Normal rate and regular rhythm.  Pulmonary:     Effort: Pulmonary effort is normal.     Breath sounds: Normal breath sounds.  Abdominal:     Comments: Nondistended.  Normal bowel sounds.  Soft and nontender to palpation.  No hepatomegaly or splenomegaly noted.  No guarding or rebound.  No masses palpated.  Neurological:     Mental Status: He is alert.      No results found for any visits on 07/02/24.  Last metabolic panel Lab Results  Component  Value Date   GLUCOSE 117 (H) 05/03/2024   NA 135 05/03/2024   K 4.3 05/03/2024   CL 100 05/03/2024   CO2 27 05/03/2024   BUN 13 05/03/2024   CREATININE 0.98 05/03/2024   GFR 73.59 05/03/2024   CALCIUM 9.3 05/03/2024   PROT 7.1 05/03/2024   ALBUMIN 4.4 05/03/2024   BILITOT 1.2 05/03/2024   ALKPHOS 46 05/03/2024   AST 21 05/03/2024   ALT 15 05/03/2024   ANIONGAP 12 09/25/2018   Last lipids Lab Results  Component Value Date   CHOL 151 05/03/2024   HDL 57.80 05/03/2024   LDLCALC 75 05/03/2024   LDLDIRECT 114.7 08/19/2013   TRIG 93.0 05/03/2024   CHOLHDL 3 05/03/2024   Last hemoglobin A1c Lab Results  Component Value Date   HGBA1C 6.5 05/03/2024   Last thyroid  functions Lab Results  Component Value Date   TSH 1.75 11/30/2020      The  10-year ASCVD risk score (Arnett DK, et al., 2019) is: 34.9%    Assessment & Plan:   Problem List Items Addressed This Visit   None Visit Diagnoses       Pain of upper abdomen    -  Primary   Relevant Orders   CBC with Differential/Platelet   Lipase     Presents with over 47-month history of increased abdominal bloating, early satiety, frequent burping, modest weight loss.  Denies any nausea or vomiting.  No melena.  No history of ulcers.  Already on omeprazole and Pepcid  -Check CBC and lipase.  Recent CMP unremarkable - Patient also had colonoscopy reportedly 2 years ago which was unremarkable - Set up nuclear medicine gastric emptying study - If above unrevealing and symptoms persist consider CT abdomen pelvis versus GI referral  No follow-ups on file.    Wolm Scarlet, MD

## 2024-07-02 NOTE — Patient Instructions (Signed)
 We will call with lab results  Will be setting up gastric emptying study to further evaluate.

## 2024-07-03 ENCOUNTER — Ambulatory Visit: Payer: Self-pay | Admitting: Family Medicine

## 2024-07-03 DIAGNOSIS — R140 Abdominal distension (gaseous): Secondary | ICD-10-CM

## 2024-07-03 LAB — CBC WITH DIFFERENTIAL/PLATELET
Basophils Absolute: 0.1 K/uL (ref 0.0–0.1)
Basophils Relative: 1 % (ref 0.0–3.0)
Eosinophils Absolute: 0.3 K/uL (ref 0.0–0.7)
Eosinophils Relative: 3.6 % (ref 0.0–5.0)
HCT: 46.8 % (ref 39.0–52.0)
Hemoglobin: 15.6 g/dL (ref 13.0–17.0)
Lymphocytes Relative: 21.8 % (ref 12.0–46.0)
Lymphs Abs: 1.6 K/uL (ref 0.7–4.0)
MCHC: 33.2 g/dL (ref 30.0–36.0)
MCV: 96.3 fl (ref 78.0–100.0)
Monocytes Absolute: 0.7 K/uL (ref 0.1–1.0)
Monocytes Relative: 10.4 % (ref 3.0–12.0)
Neutro Abs: 4.6 K/uL (ref 1.4–7.7)
Neutrophils Relative %: 63.2 % (ref 43.0–77.0)
Platelets: 211 K/uL (ref 150.0–400.0)
RBC: 4.86 Mil/uL (ref 4.22–5.81)
RDW: 13.5 % (ref 11.5–15.5)
WBC: 7.2 K/uL (ref 4.0–10.5)

## 2024-07-03 LAB — LIPASE: Lipase: 58 U/L (ref 11.0–59.0)

## 2024-07-22 ENCOUNTER — Ambulatory Visit (HOSPITAL_COMMUNITY)
Admission: RE | Admit: 2024-07-22 | Discharge: 2024-07-22 | Disposition: A | Discharge status: Home / Self Care | Source: Ambulatory Visit | Attending: Family Medicine | Admitting: Family Medicine

## 2024-07-22 DIAGNOSIS — R6881 Early satiety: Secondary | ICD-10-CM | POA: Diagnosis not present

## 2024-07-22 DIAGNOSIS — R140 Abdominal distension (gaseous): Secondary | ICD-10-CM | POA: Insufficient documentation

## 2024-07-22 DIAGNOSIS — R634 Abnormal weight loss: Secondary | ICD-10-CM | POA: Insufficient documentation

## 2024-07-22 MED ORDER — TECHNETIUM TC 99M SULFUR COLLOID
2.2000 | Freq: Once | INTRAVENOUS | Status: AC
  Administered 2024-07-22: 2.2 via ORAL

## 2024-07-23 NOTE — Addendum Note (Signed)
 Addended by: METTA KRISTEN CROME on: 07/23/2024 03:28 PM   Modules accepted: Orders

## 2024-08-26 ENCOUNTER — Ambulatory Visit

## 2024-09-03 DIAGNOSIS — H00016 Hordeolum externum left eye, unspecified eyelid: Secondary | ICD-10-CM | POA: Diagnosis not present

## 2024-09-04 ENCOUNTER — Ambulatory Visit (INDEPENDENT_AMBULATORY_CARE_PROVIDER_SITE_OTHER): Admitting: Physician Assistant

## 2024-09-04 ENCOUNTER — Encounter: Payer: Self-pay | Admitting: Physician Assistant

## 2024-09-04 VITALS — BP 124/80 | HR 84 | Ht 74.41 in | Wt 231.0 lb

## 2024-09-04 DIAGNOSIS — R634 Abnormal weight loss: Secondary | ICD-10-CM | POA: Diagnosis not present

## 2024-09-04 DIAGNOSIS — R142 Eructation: Secondary | ICD-10-CM

## 2024-09-04 DIAGNOSIS — R1013 Epigastric pain: Secondary | ICD-10-CM

## 2024-09-04 DIAGNOSIS — R6881 Early satiety: Secondary | ICD-10-CM | POA: Diagnosis not present

## 2024-09-04 NOTE — Progress Notes (Signed)
 Noted

## 2024-09-04 NOTE — Progress Notes (Signed)
 Paul Console, Paul Townsend [address] [place], [place]  [address] Phone: [phone]   Gastroenterology Consultation  Referring Provider:     Micheal Paul ORN, Paul Townsend Primary Care Physician:  Paul Paul ORN, Paul Townsend Primary Gastroenterologist:  Paul Console, Paul Townsend / Paul Kiang, Paul Townsend  Reason for Consultation:     Abdominal bloating, early satiety, weight loss        HPI:    78 year old male, new patient, is here to evaluate abdominal bloating, abdominal fullness, and belching.  Has history of constipation and GERD.  Patient saw his PCP Dr. Micheal 07/02/2024 to evaluate 45-month history of abdominal bloating, early satiety, frequent belching, and modest weight loss.  He was not having any nausea, vomiting, melena, or history of ulcers.  Has been taking omeprazole 20 mg every morning and Pepcid 20 mg every afternoon with benefit.  Still having breakthrough upper GI symptoms.  Past history of pancreatitis in 2019.  He experiences early satiety and reports a significant weight loss of 15 pounds over the past few months, which he attributes to reduced food intake due to discomfort after eating.  Recent gastric emptying test was normal.  No abdominal pain currently.  He has had occasional episodes of right upper quadrant pain under the right rib.  Denies constipation, diarrhea, vomiting, heartburn, or solid food dysphagia.  History of acid reflux in the past but not recently.  He has family history of his mother who died from colon cancer at age 27.  He has had multiple previous colonoscopies, but no upper endoscopy.  No recent abdominal imaging.  Still has gallbladder.  No abdominal surgeries.  Previous GI evaluations: patient saw Dr. Donnald in 2001.  He saw Dr. Kristie in 2004 and 2009. Last colonoscopy 2 years ago reportedly unremarkable by Dr. Kristie.  Colonoscopy in 2009 by Dr. Kristie was reportedly normal.  I am requesting colonoscopy records.  07/02/2024 labs: Normal lipase 58.  Normal CBC with  WBC 7.2, Hgb 15.6. 05/03/2024 labs: Normal CMP and LFTs.  A1c 6.5.  PMH: CAD, HTN, diverticulosis, GERD, constipation, prediabetes.  05/2024 CT angio chest aorta:  1. Stable 4.3 cm ascending thoracic aortic aneurysm. Annual CT follow up recommended. 2. Right renal cyst. 3. No acute cardiopulmonary process.  09/2018 CT abdomen pelvis with contrast: 1. 4.3 cm ascending thoracic aortic aneurysm without dissection. 2. Left main and three-vessel coronary arteriosclerosis. 3. Calcified 7 mm pulmonary nodule in the right lung apex consistent with a small granuloma. Right paratracheal calcified lymph node also consistent with old granulomatous disease. 4. Hazy appearance of the peripancreatic fat and pancreatic gland along the pancreatic head and uncinate process question acute uncomplicated pancreatitis. As the second portion of the duodenum is also in this location, stigmata of duodenitis is a possibility though believed less likely given lack of significant mural thickening. 5. Simple right-sided renal cysts. 6. Thoracolumbar spondylosis.  PMH: Thoracic aortic aneurysm (stable, unchanged 4.3 cm).  Hypertension, hyperlipidemia, arthritis.  Past Medical History:  Diagnosis Date   Arthritis    Hyperlipidemia    Hypertension     Past Surgical History:  Procedure Laterality Date   ORIF Rt femur Right 01/05/2014   TONSILLECTOMY     TOOTH EXTRACTION     3 teeth   TOTAL KNEE ARTHROPLASTY     VARICOSE VEIN SURGERY      Prior to Admission medications   Medication Sig Start Date End Date Taking? Authorizing Provider  Ascorbic Acid (VITAMIN C PO) Take by  mouth.    Provider, Historical, Paul Townsend  aspirin 81 MG chewable tablet Chew 81 mg by mouth.    Provider, Historical, Paul Townsend  Calcium Citrate-Vitamin D  (CALCIUM + D PO) Take by mouth.    Provider, Historical, Paul Townsend  ezetimibe -simvastatin  (VYTORIN ) 10-20 MG tablet TAKE 1 TABLET AT BEDTIME 05/03/24   Paul Townsend, Paul ORN, Paul Townsend  hydrochlorothiazide   (MICROZIDE ) 12.5 MG capsule TAKE 1 CAPSULE DAILY 05/03/24   Paul Townsend, Paul ORN, Paul Townsend  lisinopril  (ZESTRIL ) 10 MG tablet Take 1 tablet (10 mg total) by mouth daily. 05/03/24   Paul Townsend, Paul ORN, Paul Townsend  Misc Natural Products (TART CHERRY ADVANCED PO) Take 1 capsule by mouth daily.    Provider, Historical, Paul Townsend  omeprazole (PRILOSEC OTC) 20 MG tablet Take by mouth.    Provider, Historical, Paul Townsend  Potassium 99 MG TABS Take 1 tablet by mouth daily.    Provider, Historical, Paul Townsend  Prasterone, DHEA, (DHEA 50 PO) Take by mouth.    Provider, Historical, Paul Townsend  sertraline  (ZOLOFT ) 50 MG tablet Take 1 tablet (50 mg total) by mouth daily. 05/03/24   Paul Townsend, Paul ORN, Paul Townsend  vitamin B-12 (CYANOCOBALAMIN ) 1000 MCG tablet Take 1,000 mcg by mouth daily.    Provider, Historical, Paul Townsend  Zinc Acetate, Oral, (ZINC ACETATE PO) Take by mouth.    Provider, Historical, Paul Townsend    Family History  Problem Relation Age of Onset   Cancer Mother        colon   Arthritis Father    Hypertension Father    Heart disease Father 37       MI     Social History   Tobacco Use   Smoking status: Former   Smokeless tobacco: Never   Tobacco comments:    Did not review today due to time limits   Vaping Use   Vaping status: Never Used  Substance Use Topics   Alcohol use: Yes    Alcohol/week: 7.0 standard drinks of alcohol    Types: 7 Glasses of wine per week    Comment: wine    Allergies as of 09/04/2024   (No Known Allergies)    Review of Systems:    All systems reviewed and negative except where noted in HPI.   Physical Exam:  BP 124/80   Pulse 84   Ht 6' 2.41 (1.89 m)   Wt 231 lb (104.8 kg)   BMI 29.33 kg/m  No LMP for male patient.  General:   Alert,  Well-developed, well-nourished, pleasant and cooperative in NAD Lungs:  Respirations even and unlabored.  Clear throughout to auscultation.   No wheezes, crackles, or rhonchi. No acute distress. Heart:  Regular rate and rhythm; no murmurs, clicks, rubs, or gallops. Abdomen:   Normal bowel sounds.  No bruits.  Soft, and non-distended without masses, hepatosplenomegaly or hernias noted.  Mild epigastric and RUQ tenderness.  Rest of abdomen is not tender.  No guarding or rebound tenderness.    Neurologic:  Alert and oriented x3;  grossly normal neurologically. Psych:  Alert and cooperative. Normal mood and affect.   Imaging Studies: No results found.  Labs: CBC    Component Value Date/Time   WBC 7.2 07/02/2024 1540   RBC 4.86 07/02/2024 1540   HGB 15.6 07/02/2024 1540   HCT 46.8 07/02/2024 1540   PLT 211.0 07/02/2024 1540   MCV 96.3 07/02/2024 1540    CMP     Component Value Date/Time   NA 135 05/03/2024 0943   K 4.3 05/03/2024 0943   CL 100  05/03/2024 0943   CO2 27 05/03/2024 0943   GLUCOSE 117 (H) 05/03/2024 0943   BUN 13 05/03/2024 0943   CREATININE 0.98 05/03/2024 0943   CALCIUM 9.3 05/03/2024 0943   PROT 7.1 05/03/2024 0943   ALBUMIN 4.4 05/03/2024 0943   AST 21 05/03/2024 0943   ALT 15 05/03/2024 0943   ALKPHOS 46 05/03/2024 0943   BILITOT 1.2 05/03/2024 0943   GFRNONAA >60 09/25/2018 1053   GFRAA >60 09/25/2018 1053    Assessment and Plan:   Khristian Phillippi is a 78 y.o. y/o male has been referred for:   1.  Abdominal bloating, Belching, Dyspepsia 2.  Early satiety  3.  History of GERD with frequent belching 4.  Unintentional weight loss 5.  Family history of colon cancer mother age 76.  Plan: - Request GI records (previous colonoscopies from Dr. Kristie).  Most recent colonoscopy done 2 years ago was reportedly normal. - Complete abdominal ultrasound - Diatherix stool test for H. Pylori - Continue omeprazole 20 mg every morning and Pepcid 20 JMG every afternoon. - Scheduling EGD I discussed risks of EGD with patient to include risk of bleeding, perforation, and risk of sedation.  Patient expressed understanding and agrees to proceed with EGD.  -Pending EGD and ultrasound results, low threshold to order abdominal pelvic CT  with contrast.   Follow up 4 weeks after EGD with TG.  Paul Console, Paul Townsend

## 2024-09-04 NOTE — Patient Instructions (Signed)
 You have been scheduled for an abdominal ultrasound at [facility] (1st floor of hospital) on 09/12/24 at 8:30 am. Please arrive 30 minutes prior to your appointment for registration. Make certain not to have anything to eat or drink 6 hours prior to your appointment. Should you need to reschedule your appointment, please contact radiology at [phone]. This test typically takes about 30 minutes to perform.  Your provider has ordered Diatherix stool testing for you. You have received a kit from our office today containing all necessary supplies to complete this test. Please carefully read the stool collection instructions provided in the kit before opening the accompanying materials. In addition, be sure there is a label providing your full name and date of birth on the puritan opti-swab tube that is supplied in the kit (if you do not see a label with this information on your test tube, please make us  aware before test collection!). After completing the test, you should secure the purtian tube into the specimen biohazard bag. The [facility] E-Req sheet (including date and time of specimen collection) should be placed into the outside pocket of the specimen biohazard bag and returned to the [place] lab (basement floor of [facility]) within 3 days of collection. Please make sure to give the specimen to a staff member at the lab. DO NOT leave the specimen on the counter.  If the specimen date and time (can be found in the upper right boxed portion of the sheet) are not filled out on the E-Req sheet, the test will NOT be performed.   You have been scheduled for an Endoscopy. Please follow written instructions given to you at your visit today.  If you use inhalers (even only as needed), please bring them with you on the day of your procedure.  If you take any of the following medications, they will need to be adjusted prior to your procedure:   DO NOT TAKE 7  DAYS PRIOR TO TEST- Trulicity (dulaglutide) Ozempic, Wegovy (semaglutide) Mounjaro (tirzepatide) Bydureon Bcise (exanatide extended release)  DO NOT TAKE 1 DAY PRIOR TO YOUR TEST Rybelsus (semaglutide) Adlyxin (lixisenatide) Victoza (liraglutide) Byetta (exanatide) ___________________________________________________________________________  Please follow up sooner if symptoms increase or worsen  Due to recent changes in healthcare laws, you may see the results of your imaging and laboratory studies on MyChart before your provider has had a chance to review them.  We understand that in some cases there may be results that are confusing or concerning to you. Not all laboratory results come back in the same time frame and the provider may be waiting for multiple results in order to interpret others.  Please give us  48 hours in order for your provider to thoroughly review all the results before contacting the office for clarification of your results.   Thank you for trusting me with your gastrointestinal care!   Ellouise Console, PA-C _______________________________________________________  If your blood pressure at your visit was 140/90 or greater, please contact your primary care physician to follow up on this.  _______________________________________________________  If you are age 85 or older, your body mass index should be between 23-30. Your Body mass index is 29.33 kg/m. If this is out of the aforementioned range listed, please consider follow up with your Primary Care Provider.  If you are age 10 or younger, your body mass index should be between 19-25. Your Body mass index is 29.33 kg/m. If this is out of the aformentioned range listed, please consider  follow up with your Primary Care Provider.   ________________________________________________________  The [facility] providers would like to encourage you to use MYCHART to communicate with providers for non-urgent requests or  questions.  Due to long hold times on the telephone, sending your provider a message by [facility] may be a faster and more efficient way to get a response.  Please allow 48 business hours for a response.  Please remember that this is for non-urgent requests.  _______________________________________________________

## 2024-09-05 ENCOUNTER — Encounter: Payer: Self-pay | Admitting: Internal Medicine

## 2024-09-05 ENCOUNTER — Ambulatory Visit (AMBULATORY_SURGERY_CENTER): Admitting: Internal Medicine

## 2024-09-05 VITALS — BP 130/78 | HR 60 | Temp 98.0°F | Resp 13 | Ht 74.0 in | Wt 231.0 lb

## 2024-09-05 DIAGNOSIS — R6881 Early satiety: Secondary | ICD-10-CM | POA: Diagnosis not present

## 2024-09-05 DIAGNOSIS — I2510 Atherosclerotic heart disease of native coronary artery without angina pectoris: Secondary | ICD-10-CM | POA: Diagnosis not present

## 2024-09-05 DIAGNOSIS — K3189 Other diseases of stomach and duodenum: Secondary | ICD-10-CM | POA: Diagnosis not present

## 2024-09-05 DIAGNOSIS — K449 Diaphragmatic hernia without obstruction or gangrene: Secondary | ICD-10-CM | POA: Diagnosis not present

## 2024-09-05 DIAGNOSIS — R7303 Prediabetes: Secondary | ICD-10-CM | POA: Diagnosis not present

## 2024-09-05 DIAGNOSIS — R1013 Epigastric pain: Secondary | ICD-10-CM

## 2024-09-05 DIAGNOSIS — I10 Essential (primary) hypertension: Secondary | ICD-10-CM | POA: Diagnosis not present

## 2024-09-05 DIAGNOSIS — K317 Polyp of stomach and duodenum: Secondary | ICD-10-CM

## 2024-09-05 DIAGNOSIS — R142 Eructation: Secondary | ICD-10-CM | POA: Diagnosis not present

## 2024-09-05 DIAGNOSIS — R634 Abnormal weight loss: Secondary | ICD-10-CM | POA: Diagnosis not present

## 2024-09-05 MED ORDER — SODIUM CHLORIDE 0.9 % IV SOLN: 500.0000 mL | Freq: Once | INTRAVENOUS | Status: DC

## 2024-09-05 NOTE — Progress Notes (Signed)
 Vss nad trans to pacu

## 2024-09-05 NOTE — Op Note (Signed)
 [facility] Patient Name: Paul Townsend Procedure Date: 09/05/2024 3:03 PM MRN: [id number] Endoscopist: Norleen SAILOR. Abran , MD, [id number] Age: 78 Referring MD:  Date of Birth: 08-03-1946 Gender: Male Account #: [id number] Procedure:                Upper GI endoscopy Indications:              Dyspepsia, Abdominal bloating, Weight loss Medicines:                Monitored Anesthesia Care Procedure:                Pre-Anesthesia Assessment:                           - Prior to the procedure, a History and Physical                            was performed, and patient medications and                            allergies were reviewed. The patient's tolerance of                            previous anesthesia was also reviewed. The risks                            and benefits of the procedure and the sedation                            options and risks were discussed with the patient.                            All questions were answered, and informed consent                            was obtained. Prior Anticoagulants: The patient has                            taken no anticoagulant or antiplatelet agents.                            After reviewing the risks and benefits, the patient                            was deemed in satisfactory condition to undergo the                            procedure.                           After obtaining informed consent, the endoscope was                            passed under direct vision. Throughout the  procedure, the patient's blood pressure, pulse, and                            oxygen saturations were monitored continuously. The                            Olympus Scope SN [id number] was introduced through the                            mouth, and advanced to the second part of duodenum.                            The upper GI endoscopy was accomplished without                            difficulty. The patient  tolerated the procedure                            well. Scope In: Scope Out: Findings:                 The esophagus revealed prominent esophageal veins                            but was otherwise normal.                           The stomach revealed benign fundic gland polyps.                            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 Examination                            of the stomach was otherwise unremarkable. Small                            hiatal hernia..                           The examined duodenum was normal.                           The cardia and gastric fundus were normal on                            retroflexion. Complications:            No immediate complications. Estimated Blood Loss:     Estimated blood loss: none. Impression:               1. Minimal findings on endoscopy                           2. Incidental benign gastric polyps  3. 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 Rule out occult liver disease. Recommendation:           - Patient has a contact number available for                            emergencies. The signs and symptoms of potential                            delayed complications were discussed with the                            patient. Return to normal activities tomorrow.                            Written discharge instructions were provided to the                            patient.                           - Resume previous diet.                           - Continue present medications.                           - PLEASE SCHEDULE CONTRAST CT SCAN OF THE ABDOMEN                            and PELVIS persistent abdominal discomfort                            exacerbated by meals and weight loss                           - Keep plans for ultrasound                           - Office  follow-up with myself or Ellouise Console in 4                            to 6 weeks Erron Wengert N. Abran, MD 09/05/2024 3:38:19 PM This report has been signed electronically.

## 2024-09-05 NOTE — Patient Instructions (Signed)
 Educational handout provided to patient related to Small hiatal hernia  Resume previous diet  Continue present medications  Keep plans for ultrasound   YOU HAD AN ENDOSCOPIC PROCEDURE TODAY AT THE [facility]:   Refer to the procedure report that was given to you for any specific questions about what was found during the examination.  If the procedure report does not answer your questions, please call your gastroenterologist to clarify.  If you requested that your care partner not be given the details of your procedure findings, then the procedure report has been included in a sealed envelope for you to review at your convenience later.  YOU SHOULD EXPECT: Some feelings of bloating in the abdomen. Passage of more gas than usual.  Walking can help get rid of the air that was put into your GI tract during the procedure and reduce the bloating. If you had a lower endoscopy (such as a colonoscopy or flexible sigmoidoscopy) you may notice spotting of blood in your stool or on the toilet paper. If you underwent a bowel prep for your procedure, you may not have a normal bowel movement for a few days.  Please Note:  You might notice some irritation and congestion in your nose or some drainage.  This is from the oxygen used during your procedure.  There is no need for concern and it should clear up in a day or so.  SYMPTOMS TO REPORT IMMEDIATELY:  Following upper endoscopy (EGD)  Vomiting of blood or coffee ground material  New chest pain or pain under the shoulder blades  Painful or persistently difficult swallowing  New shortness of breath  Fever of 100F or higher  Black, tarry-looking stools  For urgent or emergent issues, a gastroenterologist can be reached at any hour by calling [phone]. Do not use MyChart messaging for urgent concerns.    DIET:  We do recommend a small meal at first, but then you may proceed to your regular diet.  Drink plenty of fluids but you should  avoid alcoholic beverages for 24 hours.  ACTIVITY:  You should plan to take it easy for the rest of today and you should NOT DRIVE or use heavy machinery until tomorrow (because of the sedation medicines used during the test).    FOLLOW UP: Our staff will call the number listed on your records the next business day following your procedure.  We will call around 7:15- 8:00 am to check on you and address any questions or concerns that you may have regarding the information given to you following your procedure. If we do not reach you, we will leave a message.     If any biopsies were taken you will be contacted by phone or by letter within the next 1-3 weeks.  Please call us  at [phone] if you have not heard about the biopsies in 3 weeks.    SIGNATURES/CONFIDENTIALITY: You and/or your care partner have signed paperwork which will be entered into your electronic medical record.  These signatures attest to the fact that that the information above on your After Visit Summary has been reviewed and is understood.  Full responsibility of the confidentiality of this discharge information lies with you and/or your care-partner.

## 2024-09-05 NOTE — Progress Notes (Signed)
 Expand All Collapse All       Ellouise Console, PA-C [address] [place], [place]  [address] Phone: [phone]   Gastroenterology Consultation   Referring Provider:     Micheal Wolm ORN, MD Primary Care Physician:  Micheal Wolm ORN, MD Primary Gastroenterologist:  Ellouise Console, PA-C / Norleen Kiang, MD  Reason for Consultation:     Abdominal bloating, early satiety, weight loss        HPI:     78 year old male, new patient, is here to evaluate abdominal bloating, abdominal fullness, and belching.  Has history of constipation and GERD.  Patient saw his PCP Dr. Micheal 07/02/2024 to evaluate 63-month history of abdominal bloating, early satiety, frequent belching, and modest weight loss.  He was not having any nausea, vomiting, melena, or history of ulcers.  Has been taking omeprazole 20 mg every morning and Pepcid 20 mg every afternoon with benefit.  Still having breakthrough upper GI symptoms.  Past history of pancreatitis in 2019.   He experiences early satiety and reports a significant weight loss of 15 pounds over the past few months, which he attributes to reduced food intake due to discomfort after eating.  Recent gastric emptying test was normal.  No abdominal pain currently.  He has had occasional episodes of right upper quadrant pain under the right rib.  Denies constipation, diarrhea, vomiting, heartburn, or solid food dysphagia.  History of acid reflux in the past but not recently.   He has family history of his mother who died from colon cancer at age 60.  He has had multiple previous colonoscopies, but no upper endoscopy.  No recent abdominal imaging.  Still has gallbladder.  No abdominal surgeries.   Previous GI evaluations: patient saw Dr. Donnald in 2001.  He saw Dr. Kristie in 2004 and 2009. Last colonoscopy 2 years ago reportedly unremarkable by Dr. Kristie.  Colonoscopy in 2009 by Dr. Kristie was reportedly normal.  I am requesting colonoscopy records.   07/02/2024 labs:  Normal lipase 58.  Normal CBC with WBC 7.2, Hgb 15.6. 05/03/2024 labs: Normal CMP and LFTs.  A1c 6.5.   PMH: CAD, HTN, diverticulosis, GERD, constipation, prediabetes.   05/2024 CT angio chest aorta:  1. Stable 4.3 cm ascending thoracic aortic aneurysm. Annual CT follow up recommended. 2. Right renal cyst. 3. No acute cardiopulmonary process.   09/2018 CT abdomen pelvis with contrast: 1. 4.3 cm ascending thoracic aortic aneurysm without dissection. 2. Left main and three-vessel coronary arteriosclerosis. 3. Calcified 7 mm pulmonary nodule in the right lung apex consistent with a small granuloma. Right paratracheal calcified lymph node also consistent with old granulomatous disease. 4. Hazy appearance of the peripancreatic fat and pancreatic gland along the pancreatic head and uncinate process question acute uncomplicated pancreatitis. As the second portion of the duodenum is also in this location, stigmata of duodenitis is a possibility though believed less likely given lack of significant mural thickening. 5. Simple right-sided renal cysts. 6. Thoracolumbar spondylosis.   PMH: Thoracic aortic aneurysm (stable, unchanged 4.3 cm).  Hypertension, hyperlipidemia, arthritis.       Past Medical History:  Diagnosis Date   Arthritis     Hyperlipidemia     Hypertension                 Past Surgical History:  Procedure Laterality Date   ORIF Rt femur Right 01/05/2014   TONSILLECTOMY       TOOTH EXTRACTION        3 teeth  TOTAL KNEE ARTHROPLASTY       VARICOSE VEIN SURGERY                     Prior to Admission medications   Medication Sig Start Date End Date Taking? Authorizing Provider  Ascorbic Acid (VITAMIN C PO) Take by mouth.       [provider]  aspirin 81 MG chewable tablet Chew 81 mg by mouth.       [provider]  Calcium Citrate-Vitamin D  (CALCIUM + D PO) Take by mouth.       [provider]  ezetimibe -simvastatin  (VYTORIN ) 10-20 MG  tablet TAKE 1 TABLET AT BEDTIME 05/03/24     Burchette, Wolm ORN, MD  hydrochlorothiazide  (MICROZIDE ) 12.5 MG capsule TAKE 1 CAPSULE DAILY 05/03/24     Burchette, Wolm ORN, MD  lisinopril  (ZESTRIL ) 10 MG tablet Take 1 tablet (10 mg total) by mouth daily. 05/03/24     Burchette, Wolm ORN, MD  Misc Natural Products (TART CHERRY ADVANCED PO) Take 1 capsule by mouth daily.       [provider]  omeprazole (PRILOSEC OTC) 20 MG tablet Take by mouth.       [provider]  Potassium 99 MG TABS Take 1 tablet by mouth daily.       [provider]  Prasterone, DHEA, (DHEA 50 PO) Take by mouth.       [provider]  sertraline  (ZOLOFT ) 50 MG tablet Take 1 tablet (50 mg total) by mouth daily. 05/03/24     Burchette, Wolm ORN, MD  vitamin B-12 (CYANOCOBALAMIN ) 1000 MCG tablet Take 1,000 mcg by mouth daily.       [provider]  Zinc Acetate, Oral, (ZINC ACETATE PO) Take by mouth.       [provider]           Family History  Problem Relation Age of Onset   Cancer Mother          colon   Arthritis Father     Hypertension Father     Heart disease Father 61        MI          Social History  Social History         Tobacco Use   Smoking status: Former   Smokeless tobacco: Never   Tobacco comments:      Did not review today due to time limits   Vaping Use   Vaping status: Never Used  Substance Use Topics   Alcohol use: Yes      Alcohol/week: 7.0 standard drinks of alcohol      Types: 7 Glasses of wine per week      Comment: wine           Allergies as of 09/04/2024   (No Known Allergies)      Review of Systems:    All systems reviewed and negative except where noted in HPI.    Physical Exam:  BP 124/80   Pulse 84   Ht 6' 2.41 (1.89 m)   Wt 231 lb (104.8 kg)   BMI 29.33 kg/m  No LMP for male patient.   General:   Alert,  Well-developed, well-nourished, pleasant and cooperative in NAD Lungs:  Respirations even and  unlabored.  Clear throughout to auscultation.   No wheezes, crackles, or rhonchi. No acute distress. Heart:  Regular rate and rhythm; no murmurs, clicks, rubs, or gallops. Abdomen:  Normal  bowel sounds.  No bruits.  Soft, and non-distended without masses, hepatosplenomegaly or hernias noted.  Mild epigastric and RUQ tenderness.  Rest of abdomen is not tender.  No guarding or rebound tenderness.    Neurologic:  Alert and oriented x3;  grossly normal neurologically. Psych:  Alert and cooperative. Normal mood and affect.     Imaging Studies: Imaging Results  No results found.     Labs: CBC Labs (Brief)          Component Value Date/Time    WBC 7.2 07/02/2024 1540    RBC 4.86 07/02/2024 1540    HGB 15.6 07/02/2024 1540    HCT 46.8 07/02/2024 1540    PLT 211.0 07/02/2024 1540    MCV 96.3 07/02/2024 1540        CMP     Labs (Brief)          Component Value Date/Time    NA 135 05/03/2024 0943    K 4.3 05/03/2024 0943    CL 100 05/03/2024 0943    CO2 27 05/03/2024 0943    GLUCOSE 117 (H) 05/03/2024 0943    BUN 13 05/03/2024 0943    CREATININE 0.98 05/03/2024 0943    CALCIUM 9.3 05/03/2024 0943    PROT 7.1 05/03/2024 0943    ALBUMIN 4.4 05/03/2024 0943    AST 21 05/03/2024 0943    ALT 15 05/03/2024 0943    ALKPHOS 46 05/03/2024 0943    BILITOT 1.2 05/03/2024 0943    GFRNONAA >60 09/25/2018 1053    GFRAA >60 09/25/2018 1053        Assessment and Plan:    Zabdiel Dripps is a 78 y.o. y/o male has been referred for:    1.  Abdominal bloating, Belching, Dyspepsia 2.  Early satiety  3.  History of GERD with frequent belching 4.  Unintentional weight loss 5.  Family history of colon cancer mother age 46.   Plan: - Request GI records (previous colonoscopies from Dr. Kristie).  Most recent colonoscopy done 2 years ago was reportedly normal. - Complete abdominal ultrasound - [employer] stool test for H. Pylori - Continue omeprazole 20 mg every morning and Pepcid 20 JMG  every afternoon. - Scheduling EGD I discussed risks of EGD with patient to include risk of bleeding, perforation, and risk of sedation.  Patient expressed understanding and agrees to proceed with EGD.  -Pending EGD and ultrasound results, low threshold to order abdominal pelvic CT with contrast.   Follow up 4 weeks after EGD with TG.   Ellouise Console, PA-C    Recent H&P as above.  No interval change.

## 2024-09-06 ENCOUNTER — Other Ambulatory Visit: Payer: Self-pay

## 2024-09-06 ENCOUNTER — Telehealth: Payer: Self-pay

## 2024-09-06 DIAGNOSIS — R634 Abnormal weight loss: Secondary | ICD-10-CM

## 2024-09-06 DIAGNOSIS — R1013 Epigastric pain: Secondary | ICD-10-CM

## 2024-09-06 NOTE — Telephone Encounter (Signed)
 Follow up call to pt, lm for pt to call if having any difficulty with normal activities or eating and drinking.  Also to call if any other questions or concerns.

## 2024-09-06 NOTE — Telephone Encounter (Signed)
 Patient returning call stating he is feeling fine and has had no negative activities after procedure  Please advise  Thank you

## 2024-09-09 ENCOUNTER — Telehealth: Payer: Self-pay

## 2024-09-09 NOTE — Telephone Encounter (Signed)
 Patients diatherix stool test is negative H.Pylori. I will inform the patient once you review the results.

## 2024-09-12 ENCOUNTER — Ambulatory Visit (HOSPITAL_COMMUNITY)
Admission: RE | Admit: 2024-09-12 | Discharge: 2024-09-12 | Disposition: A | Discharge status: Home / Self Care | Source: Ambulatory Visit | Attending: Physician Assistant | Admitting: Physician Assistant

## 2024-09-12 DIAGNOSIS — R142 Eructation: Secondary | ICD-10-CM | POA: Diagnosis not present

## 2024-09-12 DIAGNOSIS — R6881 Early satiety: Secondary | ICD-10-CM | POA: Diagnosis not present

## 2024-09-12 DIAGNOSIS — R634 Abnormal weight loss: Secondary | ICD-10-CM | POA: Diagnosis not present

## 2024-09-12 DIAGNOSIS — R1013 Epigastric pain: Secondary | ICD-10-CM | POA: Diagnosis not present

## 2024-09-13 ENCOUNTER — Ambulatory Visit (HOSPITAL_COMMUNITY)
Admission: RE | Admit: 2024-09-13 | Discharge: 2024-09-13 | Disposition: A | Discharge status: Home / Self Care | Source: Ambulatory Visit | Attending: Internal Medicine | Admitting: Internal Medicine

## 2024-09-13 DIAGNOSIS — R109 Unspecified abdominal pain: Secondary | ICD-10-CM | POA: Diagnosis not present

## 2024-09-13 DIAGNOSIS — R1013 Epigastric pain: Secondary | ICD-10-CM | POA: Insufficient documentation

## 2024-09-13 DIAGNOSIS — K5730 Diverticulosis of large intestine without perforation or abscess without bleeding: Secondary | ICD-10-CM | POA: Diagnosis not present

## 2024-09-13 DIAGNOSIS — R634 Abnormal weight loss: Secondary | ICD-10-CM | POA: Diagnosis not present

## 2024-09-13 MED ORDER — IOHEXOL 300 MG/ML  SOLN
100.0000 mL | Freq: Once | INTRAMUSCULAR | Status: AC | PRN
  Administered 2024-09-13: 100 mL via INTRAVENOUS

## 2024-09-13 MED ORDER — SODIUM CHLORIDE (PF) 0.9 % IJ SOLN
INTRAMUSCULAR | Status: AC
  Filled 2024-09-13: qty 50

## 2024-09-16 ENCOUNTER — Ambulatory Visit: Payer: Self-pay | Admitting: Physician Assistant

## 2024-09-17 ENCOUNTER — Ambulatory Visit: Payer: Self-pay | Admitting: Internal Medicine

## 2024-09-17 ENCOUNTER — Encounter: Payer: Self-pay | Admitting: Physician Assistant

## 2024-09-23 ENCOUNTER — Other Ambulatory Visit: Payer: Self-pay | Admitting: Family Medicine

## 2024-10-08 ENCOUNTER — Telehealth: Payer: Self-pay | Admitting: Physician Assistant

## 2024-10-08 NOTE — Telephone Encounter (Signed)
GI records

## 2024-10-09 DIAGNOSIS — H43813 Vitreous degeneration, bilateral: Secondary | ICD-10-CM | POA: Diagnosis not present

## 2024-10-09 DIAGNOSIS — Z961 Presence of intraocular lens: Secondary | ICD-10-CM | POA: Diagnosis not present

## 2024-10-09 DIAGNOSIS — H18513 Endothelial corneal dystrophy, bilateral: Secondary | ICD-10-CM | POA: Diagnosis not present

## 2024-10-09 DIAGNOSIS — H59812 Chorioretinal scars after surgery for detachment, left eye: Secondary | ICD-10-CM | POA: Diagnosis not present

## 2024-10-21 NOTE — Progress Notes (Unsigned)
 Paul Console, PA-C [address] [place], [place]  [address] Phone: [phone]   Primary Care Physician: Paul Townsend ORN, MD  Primary Gastroenterologist:  Paul Console, PA-C / Paul Kiang, MD   Chief Complaint:  F/U GERD       HPI:   Discussed the use of AI scribe software for clinical note transcription with the patient, who gave verbal consent to proceed.  He returns for 6-week follow-up of abdominal bloating, early satiety, and belching.  History of constipation and GERD.  Weight is stable and up 1 pound in the past 6 weeks.  No nausea, vomiting, or melena.  Symptoms improved but not controlled on omeprazole 20 mg every morning and Pepcid 20 mg every afternoon.  He feels 80% better.  Remote history of pancreatitis in 2019.  PMH: CAD, HTN, diverticulosis, GERD, constipation, prediabetes.  Mother had colon cancer age 54.  [employer] H. pylori stool test negative.  09/05/2024 EGD by Dr. Kiang: Prominent esophageal veins.  Benign fundic gland gastric polyps.  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  Small hiatal hernia.  Otherwise normal.  09/15/2024 complete abdominal ultrasound: No gallstones.  Normal liver, pancreas, and aorta.  Incidental 3.3 cm right kidney cyst.  09/17/2024 CT abdomen pelvis with contrast: No acute abnormality.  Normal liver, pancreas, stomach, and intestines.  Sigmoid diverticulosis but no diverticulitis.  No masses or colon inflammation.  07/2024 normal gastric emptying scan.  History of Present Illness Gastrointestinal symptoms - Stomach symptoms have improved by approximately 80% since last visit. - Discomfort persists after large dinners, especially at night. - Nocturnal bloating. - Occasional use of antacid pill before bed for symptom relief. - Significant change in stool and gas odor, described as strong. - Decreased frequency of gas and bloating compared to prior visits. - Occasional burping  observed by his wife.  Medication use - Currently taking omeprazole 20 mg in the morning. - Pepcid 20 mg taken in the evening, occasionally skipped.  Alcohol consumption - Consumes alcohol regularly, typically a drink with dinner and sometimes a nightcap.  Endoscopic and imaging findings - Endoscopy on October 30th revealed benign fundic gland gastric polyps and a small hiatal hernia. - Prominent veins in lower esophagus and stomach, without esophageal varices. - Ultrasound and CT scan showed normal liver, pancreas, stomach, and intestines.  Laboratory results - Blood work showed normal platelet count, liver tests, and hemoglobin.  GI records from Dr. Kristie.   08/2013 colonoscopy: Pandiverticulosis.  Nonbleeding internal hemorrhoids.  Normal terminal ileum.  No colon polyps.  Large amount of residual stool in the colon (GoLytely  prep), small lesions could be missed.  Repeat colonoscopy in 5 years with extra prep.   11/2026 colonoscopy: 1 small sessile hyperplastic polyp removed from the rectum.  Prep was fair (with GoLytely ).  Pandiverticulosis.  5-year repeat with a 4-week prep was recommended.    Current Outpatient Medications  Medication Sig Dispense Refill   Ascorbic Acid (VITAMIN C PO) Take by mouth. (Patient taking differently: Take by mouth as needed.)     pantoprazole  (PROTONIX ) 40 MG tablet Take 1 tablet (40 mg total) by mouth daily. 90 tablet 3   vitamin B-12 (CYANOCOBALAMIN ) 1000 MCG tablet Take 1,000 mcg by mouth daily.     aspirin 81 MG chewable tablet Chew 81 mg by mouth.     Calcium Citrate-Vitamin D  (CALCIUM + D PO) Take by mouth. (Patient not taking: Reported on 09/05/2024)  ezetimibe -simvastatin  (VYTORIN ) 10-20 MG tablet TAKE 1 TABLET AT BEDTIME 90 tablet 3   hydrochlorothiazide  (MICROZIDE ) 12.5 MG capsule TAKE 1 CAPSULE DAILY 90 capsule 3   lisinopril  (ZESTRIL ) 10 MG tablet Take 1 tablet (10 mg total) by mouth daily. 90 tablet 3   Misc Natural Products (TART  CHERRY ADVANCED PO) Take 1 capsule by mouth daily.     Potassium 99 MG TABS Take 1 tablet by mouth daily.     Prasterone, DHEA, (DHEA 50 PO) Take by mouth. (Patient not taking: Reported on 09/05/2024)     sertraline  (ZOLOFT ) 50 MG tablet TAKE 1 TABLET DAILY 90 tablet 0   Zinc Acetate, Oral, (ZINC ACETATE PO) Take by mouth.     No current facility-administered medications for this visit.    Allergies as of 10/22/2024   (No Known Allergies)    Past Medical History:  Diagnosis Date   Arthritis    Hyperlipidemia    Hypertension     Past Surgical History:  Procedure Laterality Date   ORIF Rt femur Right 01/05/2014   TONSILLECTOMY     TOOTH EXTRACTION     3 teeth   TOTAL KNEE ARTHROPLASTY     VARICOSE VEIN SURGERY      Review of Systems:    All systems reviewed and negative except where noted in HPI.    Physical Exam:  BP 116/76 (BP Location: Right Arm, Patient Position: Sitting)   Ht 6' 2 (1.88 m)   Wt 232 lb (105.2 kg)   BMI 29.79 kg/m  No LMP for male patient.  General: Well-nourished, well-developed in no acute distress.  Lungs: Clear to auscultation bilaterally. Non-labored. Heart: Regular rate and rhythm, no murmurs rubs or gallops.  Abdomen: Bowel sounds are normal; Abdomen is Soft; No hepatosplenomegaly, masses or hernias;  No ascites.  No Abdominal Tenderness; No guarding or rebound tenderness. Neuro: Alert and oriented x 3.  Grossly intact.  Psych: Alert and cooperative, normal mood and affect.   Imaging Studies: No results found.  Labs: CBC    Component Value Date/Time   WBC 7.2 07/02/2024 1540   RBC 4.86 07/02/2024 1540   HGB 15.6 07/02/2024 1540   HCT 46.8 07/02/2024 1540   PLT 211.0 07/02/2024 1540   MCV 96.3 07/02/2024 1540   MCH 31.7 09/25/2018 1053   MCHC 33.2 07/02/2024 1540   RDW 13.5 07/02/2024 1540   LYMPHSABS 1.6 07/02/2024 1540   MONOABS 0.7 07/02/2024 1540   EOSABS 0.3 07/02/2024 1540   BASOSABS 0.1 07/02/2024 1540    CMP      Component Value Date/Time   NA 135 05/03/2024 0943   K 4.3 05/03/2024 0943   CL 100 05/03/2024 0943   CO2 27 05/03/2024 0943   GLUCOSE 117 (H) 05/03/2024 0943   BUN 13 05/03/2024 0943   CREATININE 0.98 05/03/2024 0943   CALCIUM 9.3 05/03/2024 0943   PROT 7.1 05/03/2024 0943   ALBUMIN 4.4 05/03/2024 0943   AST 21 05/03/2024 0943   ALT 15 05/03/2024 0943   ALKPHOS 46 05/03/2024 0943   BILITOT 1.2 05/03/2024 0943   GFRNONAA >60 09/25/2018 1053   GFRAA >60 09/25/2018 1053     Assessment and Plan:   Paul Townsend is a 78 y.o. y/o male returns for followup of: Assessment & Plan 1.  Dyspepsia and gastroesophageal reflux disease Symptoms improved by 80% with treatment. Persistent bloating and belching postprandially. 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 Normal gastric emptying test.  Normal abdominal pelvic CT and ultrasound.  Normal CBC and CMP labs. - Discontinued omeprazole. - Prescribed pantoprazole  40 mg once daily before breakfast. - Advised alcohol intake reduction to 2-3 drinks per week or less. - Discussed EGD findings at length. - He has no current evidence of cirrhosis based on abdominal imaging and lab results. - Discussed increased risk of cirrhosis with prolonged daily alcohol use.  Patient expressed understanding.  Paul Console, PA-C  Follow up in 1 year to refill medication (pantoprazole ).  Follow-up sooner if worsening GI symptoms.

## 2024-10-22 ENCOUNTER — Encounter: Payer: Self-pay | Admitting: Physician Assistant

## 2024-10-22 ENCOUNTER — Ambulatory Visit: Admitting: Physician Assistant

## 2024-10-22 VITALS — BP 116/76 | Ht 74.0 in | Wt 232.0 lb

## 2024-10-22 DIAGNOSIS — K219 Gastro-esophageal reflux disease without esophagitis: Secondary | ICD-10-CM

## 2024-10-22 DIAGNOSIS — R1013 Epigastric pain: Secondary | ICD-10-CM | POA: Diagnosis not present

## 2024-10-22 MED ORDER — PANTOPRAZOLE SODIUM 40 MG PO TBEC: 40.0000 mg | DELAYED_RELEASE_TABLET | Freq: Every day | ORAL | 3 refills | Status: AC

## 2024-10-22 NOTE — Patient Instructions (Signed)
 Stop taking omeprazole.   We have sent the following medications to your pharmacy for you to pick up at your convenience: pantoprazole  40 mg daily.   _______________________________________________________  If your blood pressure at your visit was 140/90 or greater, please contact your primary care physician to follow up on this.  _______________________________________________________  If you are age 79 or older, your body mass index should be between 23-30. Your Body mass index is 29.79 kg/m. If this is out of the aforementioned range listed, please consider follow up with your Primary Care Provider.  If you are age 43 or younger, your body mass index should be between 19-25. Your Body mass index is 29.79 kg/m. If this is out of the aformentioned range listed, please consider follow up with your Primary Care Provider.   ________________________________________________________  The [facility] providers would like to encourage you to use MYCHART to communicate with providers for non-urgent requests or questions.  Due to long hold times on the telephone, sending your provider a message by [facility] may be a faster and more efficient way to get a response.  Please allow 48 business hours for a response.  Please remember that this is for non-urgent requests.  _______________________________________________________  [facility] is using a team-based approach to care.  Your team is made up of your doctor and two to three APPS. Our APPS (Nurse Practitioners and Physician Assistants) work with your physician to ensure care continuity for you. They are fully qualified to address your health concerns and develop a treatment plan. They communicate directly with your gastroenterologist to care for you. Seeing the Advanced Practice Practitioners on your physician's team can help you by facilitating care more promptly, often allowing for earlier appointments, access to diagnostic testing,  procedures, and other specialty referrals.

## 2024-10-23 LAB — HM COLONOSCOPY

## 2024-10-23 NOTE — Progress Notes (Signed)
 Noted

## 2024-11-06 ENCOUNTER — Encounter: Payer: Self-pay | Admitting: Family Medicine

## 2024-11-06 ENCOUNTER — Ambulatory Visit (INDEPENDENT_AMBULATORY_CARE_PROVIDER_SITE_OTHER): Admitting: Family Medicine

## 2024-11-06 VITALS — BP 128/76 | HR 64 | Temp 98.2°F | Ht 74.0 in | Wt 228.0 lb

## 2024-11-06 DIAGNOSIS — J069 Acute upper respiratory infection, unspecified: Secondary | ICD-10-CM | POA: Diagnosis not present

## 2024-11-06 MED ORDER — PROMETHAZINE-DM 6.25-15 MG/5ML PO SYRP: 5.0000 mL | ORAL_SOLUTION | Freq: Four times a day (QID) | ORAL | 0 refills | Status: DC | PRN

## 2024-11-06 MED ORDER — DOXYCYCLINE HYCLATE 100 MG PO TABS: 100.0000 mg | ORAL_TABLET | Freq: Two times a day (BID) | ORAL | 0 refills | Status: AC

## 2024-11-06 MED ORDER — BENZONATATE 200 MG PO CAPS: 200.0000 mg | ORAL_CAPSULE | Freq: Two times a day (BID) | ORAL | 0 refills | Status: DC | PRN

## 2024-11-06 NOTE — Patient Instructions (Signed)
-  It was a pleasure to care for you today. -Prescribed Doxycycline  100mg  tablet every 12 hours for 7 days for upper respiratory infection. -Prescribed Promethazine-DM cough syrup that can cause drowsiness. May take a teaspoon (5mL) at night time or up to every 6 hours as needed. -Prescribed Benzonatate 200mg  tablet every 12 hours as needed for cough. This medication does not cause drowsiness. -May benefit from taking over the counter Mucinex. Do not need to take Dayquil or NightQuil while taking these medications. -Rest, hydrate with water.  -Follow up if not improved or symptoms become worse.

## 2024-11-06 NOTE — Progress Notes (Signed)
 "  Acute Office Visit   Subjective:  Patient ID: Paul Townsend, male    DOB: [birth year], 78 y.o.   MRN: [id number]  Chief Complaint  Patient presents with   Cough    X1 month    HPI:  Patient is complaining of a cough. Started early December and progressively become worse. Not able to have productive cough, but feels congested. Initially started as a dry cough.   He has tried over the counter medication, such as DayQuil.   He has having several other symptoms:  Nasal congestion and drainage  Dizziness, lightheadedness -relates to dehydration, not changed from his typical symptoms Low grade fever   Denies headache, ear pain or pressure, chest pain, shortness of breath, body aches, nausea, vomiting, or diarrhea.   These symptoms started about a week ago.  Review of Systems  Respiratory:  Positive for cough.    See HPI above      Objective:   BP 128/76   Pulse 64   Temp 98.2 F (36.8 C) (Oral)   Ht 6' 2 (1.88 m)   Wt 228 lb (103.4 kg)   SpO2 94%   BMI 29.27 kg/m    Physical Exam Vitals reviewed.  Constitutional:      General: He is not in acute distress.    Appearance: Normal appearance. He is overweight. He is not ill-appearing, toxic-appearing or diaphoretic.  HENT:     Head: Normocephalic and atraumatic.     Right Ear: Tympanic membrane, ear canal and external ear normal. There is no impacted cerumen.     Left Ear: Tympanic membrane, ear canal and external ear normal. There is no impacted cerumen.     Nose:     Right Sinus: No maxillary sinus tenderness or frontal sinus tenderness.     Left Sinus: No maxillary sinus tenderness or frontal sinus tenderness.     Mouth/Throat:     Pharynx: Oropharynx is clear. No pharyngeal swelling, oropharyngeal exudate, posterior oropharyngeal erythema or uvula swelling.  Eyes:     General:        Right eye: No discharge.        Left eye: No discharge.     Conjunctiva/sclera: Conjunctivae normal.  Cardiovascular:      Rate and Rhythm: Normal rate and regular rhythm.     Heart sounds: Normal heart sounds. No murmur heard.    No friction rub. No gallop.  Pulmonary:     Effort: Pulmonary effort is normal. No respiratory distress.     Breath sounds: Normal breath sounds.     Comments: Non productive congested cough during visit  Musculoskeletal:        General: Normal range of motion.  Lymphadenopathy:     Head:     Right side of head: No submental or submandibular adenopathy.     Left side of head: No submental or submandibular adenopathy.  Skin:    General: Skin is warm and dry.  Neurological:     General: No focal deficit present.     Mental Status: He is alert and oriented to person, place, and time. Mental status is at baseline.  Psychiatric:        Mood and Affect: Mood normal.        Behavior: Behavior normal.        Thought Content: Thought content normal.        Judgment: Judgment normal.       Assessment & Plan:  Upper respiratory tract infection,  unspecified type -     Doxycycline  Hyclate; Take 1 tablet (100 mg total) by mouth 2 (two) times daily for 7 days.  Dispense: 14 tablet; Refill: 0 -     Promethazine-DM; Take 5 mLs by mouth 4 (four) times daily as needed.  Dispense: 118 mL; Refill: 0 -     Benzonatate; Take 1 capsule (200 mg total) by mouth 2 (two) times daily as needed for cough.  Dispense: 20 capsule; Refill: 0  -Prescribed Doxycycline  100mg  tablet every 12 hours for 7 days for upper respiratory infection. -Prescribed Promethazine-DM cough syrup that can cause drowsiness. May take a teaspoon (5mL) at night time or up to every 6 hours as needed. -Prescribed Benzonatate 200mg  tablet every 12 hours as needed for cough. This medication does not cause drowsiness. -May benefit from taking over the counter Mucinex. Do not need to take Dayquil or NightQuil while taking these medications. -Rest, hydrate with water.  -Follow up if not improved or symptoms become worse.   Adriahna Shearman, NP "

## 2024-11-19 ENCOUNTER — Encounter: Payer: Self-pay | Admitting: Family Medicine

## 2024-11-19 ENCOUNTER — Ambulatory Visit

## 2024-11-19 ENCOUNTER — Ambulatory Visit: Admitting: Family Medicine

## 2024-11-19 VITALS — BP 132/64 | HR 92 | Temp 97.8°F | Wt 228.2 lb

## 2024-11-19 DIAGNOSIS — R059 Cough, unspecified: Secondary | ICD-10-CM

## 2024-11-19 MED ORDER — HYDROCODONE BIT-HOMATROP MBR 5-1.5 MG/5ML PO SOLN: 5.0000 mL | Freq: Four times a day (QID) | ORAL | 0 refills | Status: AC | PRN

## 2024-11-19 MED ORDER — CEFUROXIME AXETIL 500 MG PO TABS: 500.0000 mg | ORAL_TABLET | Freq: Two times a day (BID) | ORAL | 0 refills | Status: AC

## 2024-11-19 NOTE — Progress Notes (Signed)
 "  Established Patient Office Visit  Subjective   Patient ID: Paul Townsend, male    DOB: 14-Mar-1946  Age: 79 y.o. MRN: [id number]  Chief Complaint  Patient presents with   Cough    HPI   Gene is seen with some persistent cough.  His chronic problems include history of hypertension, GERD, osteoarthritis, hyperlipidemia, prediabetes.  He noted onset around December 2 of some nasal congestion and cough.  Continued to feel poorly and was seen here December 31 with productive cough and started on doxycycline  and given Tessalon .  He had no fever or chills.  No hemoptysis.  No significant dyspnea.  He has had some ongoing fatigue.  Cough mostly dry but does have some cough productive of yellow sputum.  No obvious wheezing.  Weight stable.  Non-smoker.  No pleuritic pain.  He does feel slightly improved compared with December 31 but still far from normal with excessive fatigue and some persistent cough.  Cough is interfering with sleep frequently.  Past Medical History:  Diagnosis Date   Arthritis    Hyperlipidemia    Hypertension    Past Surgical History:  Procedure Laterality Date   ORIF Rt femur Right 01/05/2014   TONSILLECTOMY     TOOTH EXTRACTION     3 teeth   TOTAL KNEE ARTHROPLASTY     VARICOSE VEIN SURGERY      reports that he has quit smoking. He has never used smokeless tobacco. He reports current alcohol use of about 7.0 standard drinks of alcohol per week. No history on file for drug use. family history includes Arthritis in his father; Cancer in his mother; Heart disease (age of onset: 32) in his father; Hypertension in his father. Allergies[1]  Review of Systems  Constitutional:  Negative for chills, fever and weight loss.  HENT:  Negative for sinus pain.   Respiratory:  Positive for cough. Negative for hemoptysis and shortness of breath.   Cardiovascular:  Negative for chest pain.      Objective:     BP 132/64   Pulse 92   Temp 97.8 F (36.6 C) (Oral)   Wt  228 lb 3.2 oz (103.5 kg)   SpO2 95%   BMI 29.30 kg/m  BP Readings from Last 3 Encounters:  11/19/24 132/64  11/06/24 128/76  10/22/24 116/76   Wt Readings from Last 3 Encounters:  11/19/24 228 lb 3.2 oz (103.5 kg)  11/06/24 228 lb (103.4 kg)  10/22/24 232 lb (105.2 kg)      Physical Exam Vitals reviewed.  Constitutional:      General: He is not in acute distress.    Appearance: He is not ill-appearing.  Cardiovascular:     Rate and Rhythm: Normal rate and regular rhythm.  Pulmonary:     Effort: Pulmonary effort is normal.     Breath sounds: No wheezing.     Comments: Slightly diminished breath sounds right base greater than left Musculoskeletal:     Right lower leg: No edema.     Left lower leg: No edema.  Neurological:     Mental Status: He is alert.      No results found for any visits on 11/19/24.    The 10-year ASCVD risk score (Arnett DK, et al., 2019) is: 34.2%    Assessment & Plan:   Problem List Items Addressed This Visit   None Visit Diagnoses       Cough, unspecified type    -  Primary   Relevant  Orders   DG Chest 2 View     79 year old male seen with persistent cough for almost 6 weeks.  He was treated with doxycycline  and still feels fatigue and intermittent productive cough.  Obtain PA and lateral chest x-ray - Cover with Ceftin  500 mg twice daily for 7 days pending chest x-ray over read. - Follow-up immediately for any fever or increased shortness of breath. - We wrote for limited Hycodan 1 teaspoon every 6 hours as needed for severe cough.  He is aware of potential sedation with this.  No follow-ups on file.    Wolm Scarlet, MD     [1] No Known Allergies  "

## 2024-11-19 NOTE — Patient Instructions (Signed)
 Follow up for any fever or increased shortness of breath.

## 2024-11-26 ENCOUNTER — Ambulatory Visit: Payer: Self-pay | Admitting: Family Medicine

## 2024-12-04 ENCOUNTER — Other Ambulatory Visit: Payer: Self-pay | Admitting: Family Medicine

## 2025-05-12 ENCOUNTER — Ambulatory Visit
